# Patient Record
Sex: Female | Born: 1935 | Race: White | Hispanic: No | Marital: Married | Smoking: Never smoker
Health system: Southern US, Community
[De-identification: ages and names within clinical notes are randomized; demographics above are authoritative.]

## PROBLEM LIST (undated history)

## (undated) DIAGNOSIS — K5790 Diverticulosis of intestine, part unspecified, without perforation or abscess without bleeding: Secondary | ICD-10-CM

## (undated) DIAGNOSIS — N6019 Diffuse cystic mastopathy of unspecified breast: Secondary | ICD-10-CM

## (undated) DIAGNOSIS — Z860100 Personal history of colon polyps, unspecified: Secondary | ICD-10-CM

## (undated) DIAGNOSIS — K56609 Unspecified intestinal obstruction, unspecified as to partial versus complete obstruction: Secondary | ICD-10-CM

## (undated) DIAGNOSIS — E049 Nontoxic goiter, unspecified: Secondary | ICD-10-CM

## (undated) DIAGNOSIS — J45909 Unspecified asthma, uncomplicated: Secondary | ICD-10-CM

## (undated) DIAGNOSIS — I495 Sick sinus syndrome: Secondary | ICD-10-CM

## (undated) DIAGNOSIS — I10 Essential (primary) hypertension: Secondary | ICD-10-CM

## (undated) DIAGNOSIS — I480 Paroxysmal atrial fibrillation: Secondary | ICD-10-CM

## (undated) DIAGNOSIS — Z780 Asymptomatic menopausal state: Secondary | ICD-10-CM

## (undated) DIAGNOSIS — E039 Hypothyroidism, unspecified: Secondary | ICD-10-CM

## (undated) DIAGNOSIS — C44311 Basal cell carcinoma of skin of nose: Secondary | ICD-10-CM

## (undated) DIAGNOSIS — Z8619 Personal history of other infectious and parasitic diseases: Secondary | ICD-10-CM

## (undated) DIAGNOSIS — I499 Cardiac arrhythmia, unspecified: Secondary | ICD-10-CM

## (undated) HISTORY — DX: Hypothyroidism, unspecified: E03.9

## (undated) HISTORY — DX: Sick sinus syndrome: I49.5

## (undated) HISTORY — DX: Paroxysmal atrial fibrillation: I48.0

## (undated) HISTORY — DX: Unspecified intestinal obstruction, unspecified as to partial versus complete obstruction: K56.609

## (undated) HISTORY — PX: INSERT / REPLACE / REMOVE PACEMAKER: SUR710

## (undated) HISTORY — DX: Diffuse cystic mastopathy of unspecified breast: N60.19

## (undated) HISTORY — DX: Nontoxic goiter, unspecified: E04.9

## (undated) HISTORY — DX: Unspecified asthma, uncomplicated: J45.909

## (undated) HISTORY — DX: Asymptomatic menopausal state: Z78.0

## (undated) HISTORY — PX: COLONOSCOPY: SHX174

---

## 2004-10-27 ENCOUNTER — Ambulatory Visit: Payer: Self-pay | Admitting: Internal Medicine

## 2004-12-26 ENCOUNTER — Ambulatory Visit: Payer: Self-pay

## 2006-10-22 ENCOUNTER — Ambulatory Visit: Payer: Self-pay | Admitting: Internal Medicine

## 2008-03-04 ENCOUNTER — Ambulatory Visit: Payer: Self-pay | Admitting: Internal Medicine

## 2009-08-03 ENCOUNTER — Ambulatory Visit: Payer: Self-pay | Admitting: Internal Medicine

## 2009-08-03 DIAGNOSIS — K56609 Unspecified intestinal obstruction, unspecified as to partial versus complete obstruction: Secondary | ICD-10-CM

## 2009-08-03 HISTORY — DX: Unspecified intestinal obstruction, unspecified as to partial versus complete obstruction: K56.609

## 2009-08-27 ENCOUNTER — Inpatient Hospital Stay: Payer: Self-pay | Admitting: Internal Medicine

## 2010-08-16 ENCOUNTER — Ambulatory Visit: Payer: Self-pay | Admitting: Internal Medicine

## 2011-10-31 ENCOUNTER — Ambulatory Visit: Payer: Self-pay | Admitting: Internal Medicine

## 2012-02-05 ENCOUNTER — Encounter: Payer: Self-pay | Admitting: *Deleted

## 2012-02-06 ENCOUNTER — Ambulatory Visit (INDEPENDENT_AMBULATORY_CARE_PROVIDER_SITE_OTHER): Payer: Medicare Other | Admitting: Cardiovascular Disease

## 2012-02-06 ENCOUNTER — Encounter: Payer: Self-pay | Admitting: Cardiovascular Disease

## 2012-02-06 VITALS — BP 152/62 | HR 118 | Ht 64.0 in | Wt 176.0 lb

## 2012-02-06 DIAGNOSIS — E785 Hyperlipidemia, unspecified: Secondary | ICD-10-CM

## 2012-02-06 DIAGNOSIS — I10 Essential (primary) hypertension: Secondary | ICD-10-CM

## 2012-02-06 DIAGNOSIS — E782 Mixed hyperlipidemia: Secondary | ICD-10-CM | POA: Insufficient documentation

## 2012-02-06 DIAGNOSIS — I4891 Unspecified atrial fibrillation: Secondary | ICD-10-CM

## 2012-02-06 MED ORDER — DILTIAZEM HCL ER COATED BEADS 120 MG PO CP24: 120.0000 mg | ORAL_CAPSULE | Freq: Every day | ORAL | Status: DC

## 2012-02-06 NOTE — Progress Notes (Signed)
Patient ID: Hailey Simmons, female    DOB: 1936/11/22, 76 y.o.   MRN: [id number]  HPI Comments: Hailey Simmons is a very pleasant 76 year old woman, patient of Dr. Bethann Punches, with history of hypothyroidism, hyperlipidemia , who presents by self referral for evaluation of atrial fibrillation.   She reports that she had a routine visit in January 2013 with Dr. Hyacinth Meeker and was noted at that time to have atrial fibrillation. Echocardiogram was ordered that showed mild LVH, mild MR otherwise normal study. Right ventricular systolic pressure 44 mm mercury.   She has been tracking her blood pressure and heart rate over the past several months and has noted numerous episodes with irregular heart beats, sometimes with rates greater than 100. Maximum rate has been in the 120 range. When it is elevated, she sits in a recliner all day afraid to do anything though she is relatively asymptomatic with no significant palpitations or shortness of breath. She has been taking aspirin 325 mg daily. She is otherwise active with no complaints.  She does have a strong family history of coronary artery disease, father died of heart attack at age 40, mother at age 30 both from cardiac disease.  Recent lab work shows total cholesterol 207, LDL 131, HDL 52, TSH 4.3  EKG today shows atrial fibrillation with ventricular rate 118 beats per minute. Right bundle branch block, nonspecific ST abnormality   Outpatient Encounter Prescriptions as of 02/06/2012  Medication Sig Dispense Refill  . aspirin EC 81 MG tablet Take 81 mg by mouth every other day.      . calcium carbonate (OS-CAL) 600 MG TABS Take 600 mg by mouth 2 (two) times daily with a meal.      . Cholecalciferol (VITAMIN D3) 2000 UNITS TABS Take 1 tablet by mouth daily.      Marland Kitchen levothyroxine (SYNTHROID, LEVOTHROID) 100 MCG tablet Take 100 mcg by mouth daily.        Review of Systems  Constitutional: Negative.   HENT: Negative.   Eyes: Negative.   Respiratory: Negative.    Cardiovascular: Positive for palpitations.  Gastrointestinal: Negative.   Musculoskeletal: Negative.   Skin: Negative.   Neurological: Negative.   Hematological: Negative.   Psychiatric/Behavioral: Negative.   All other systems reviewed and are negative.    BP 152/62  Pulse 118  Ht 5\' 4"  (1.626 m)  Wt 176 lb (79.833 kg)  BMI 30.21 kg/m2  Physical Exam  Nursing note and vitals reviewed. Constitutional: She is oriented to person, place, and time. She appears well-developed and well-nourished.  HENT:  Head: Normocephalic.  Nose: Nose normal.  Mouth/Throat: Oropharynx is clear and moist.  Eyes: Conjunctivae are normal. Pupils are equal, round, and reactive to light.  Neck: Normal range of motion. Neck supple. No JVD present.  Cardiovascular: S1 normal, S2 normal, normal heart sounds and intact distal pulses.  An irregularly irregular rhythm present. Tachycardia present.  Exam reveals no gallop and no friction rub.   No murmur heard. Pulmonary/Chest: Effort normal and breath sounds normal. No respiratory distress. She has no wheezes. She has no rales. She exhibits no tenderness.  Abdominal: Soft. Bowel sounds are normal. She exhibits no distension. There is no tenderness.  Musculoskeletal: Normal range of motion. She exhibits no edema and no tenderness.  Lymphadenopathy:    She has no cervical adenopathy.  Neurological: She is alert and oriented to person, place, and time. Coordination normal.  Skin: Skin is warm and dry. No rash noted. No erythema.  Psychiatric: She has a normal mood and affect. Her behavior is normal. Judgment and thought content normal.         Assessment and Plan

## 2012-02-06 NOTE — Patient Instructions (Signed)
Please stop the aspirin Start xarelto one a day (blood thinner) Start diltiazem 120 mg once a day for blood pressure and heart rate  Monitor your heart rate and blood pressure as you have been doing. Call the office if your heart rate continues to be irregular  Please call us if you have new issues that need to be addressed before your next appt.  Your physician wants you to follow-up in: 3 weeks

## 2012-02-06 NOTE — Assessment & Plan Note (Signed)
Blood pressure has been elevated over the course of the past several months with systolic pressure typically in the 150, sometimes 160 range. This could be contributed to her atrial fibrillation episodes. We will start diltiazem and have her monitor her blood pressure with titration of medications as needed

## 2012-02-06 NOTE — Assessment & Plan Note (Signed)
Cholesterol is high and we have suggested we discussed this with her on her next clinic visit. She does have a strong family history of coronary artery disease, father died of heart attack at age 73, mother at age 69 both from cardiac disease.

## 2012-02-06 NOTE — Assessment & Plan Note (Signed)
Her notes indicate paroxysmal atrial fibrillation though unable to exclude chronic/persistent A. Fib. Notes from her echocardiogram suggests she was normal sinus rhythm at that time in February 2013.  We have suggested she start anticoagulation with xarelto 20 milligrams daily will try to control her rhythm. We will start diltiazem 120 mg daily. I've asked her to monitor her heart rate and blood pressure. Additional options for rhythm control include adding low-dose beta blocker, possibly digoxin. Even other antiarrhythmics such as flecainide could be used.

## 2012-02-27 ENCOUNTER — Encounter: Payer: Self-pay | Admitting: Cardiovascular Disease

## 2012-02-27 ENCOUNTER — Ambulatory Visit (INDEPENDENT_AMBULATORY_CARE_PROVIDER_SITE_OTHER): Payer: Medicare Other | Admitting: Cardiovascular Disease

## 2012-02-27 VITALS — BP 138/64 | HR 73 | Ht 64.0 in | Wt 176.0 lb

## 2012-02-27 DIAGNOSIS — I4891 Unspecified atrial fibrillation: Secondary | ICD-10-CM

## 2012-02-27 DIAGNOSIS — I10 Essential (primary) hypertension: Secondary | ICD-10-CM

## 2012-02-27 DIAGNOSIS — E785 Hyperlipidemia, unspecified: Secondary | ICD-10-CM

## 2012-02-27 MED ORDER — RIVAROXABAN 20 MG PO TABS: 20.0000 mg | ORAL_TABLET | Freq: Every day | ORAL | Status: DC

## 2012-02-27 MED ORDER — DILTIAZEM HCL ER COATED BEADS 180 MG PO CP24: 120.0000 mg | ORAL_CAPSULE | Freq: Every day | ORAL | Status: DC

## 2012-02-27 MED ORDER — FLECAINIDE ACETATE 50 MG PO TABS: 50.0000 mg | ORAL_TABLET | Freq: Two times a day (BID) | ORAL | Status: DC

## 2012-02-27 NOTE — Patient Instructions (Addendum)
  Please increase the diltiazem to 180 mg daily Wait a few a days If you have more atrial fib, then start  flecainide 50 mg twice a day Continue on diltiazem once a day  If you have breakthrough atrial fibrillation, take an extra flecainide 50 mg pill  Please call us if you have new issues that need to be addressed before your next appt.  Your physician wants you to follow-up in: 1 months.  You will receive a reminder letter in the mail two months in advance. If you don't receive a letter, please call our office to schedule the follow-up appointment.

## 2012-02-27 NOTE — Assessment & Plan Note (Signed)
We'll discuss her high cholesterol with her on the next visit given we are starting other medications for atrial fibrillation.

## 2012-02-27 NOTE — Progress Notes (Signed)
Patient ID: Hailey Simmons, female    DOB: [birth year], 76 y.o.   MRN: [id number]  HPI Comments: Hailey Simmons is a very pleasant 76 year old woman, patient of Dr. Bethann Punches, with history of hypothyroidism, hyperlipidemia , recently seen for management of atrial fibrillation 3 weeks ago.  January 2013 with Dr. Hyacinth Meeker  was noted at that time to have atrial fibrillation. Echocardiogram was ordered that showed mild LVH, mild MR otherwise normal study. Right ventricular systolic pressure 44 mm mercury. At that time she was in atrial fibrillation. Diltiazem 120 mg daily was started.  She reports having several episodes of atrial fibrillation since her last office visit. Approximately 9 or 10 episodes ranging from 1 hour to 8 hours. The diltiazem has seemed to help to some degree. Blood pressure measurements continue to be periodically elevated. She otherwise feels well and has no complaints.  She does have a strong family history of coronary artery disease, father died of heart attack at age 2, mother at age 17 both from cardiac disease.  Recent lab work shows total cholesterol 207, LDL 131, HDL 52, TSH 4.3  EKG today shows normal sinus rhythm with right bundle branch block   Outpatient Encounter Prescriptions as of 02/27/2012  Medication Sig Dispense Refill  . calcium carbonate (OS-CAL) 600 MG TABS Take 600 mg by mouth 2 (two) times daily with a meal.      . Cholecalciferol (VITAMIN D3) 2000 UNITS TABS Take 1 tablet by mouth daily.      Marland Kitchen diltiazem (CARDIZEM CD) 120 MG 24 hr capsule Take 1 capsule (120 mg total) by mouth daily.  30 capsule  11  . levothyroxine (SYNTHROID, LEVOTHROID) 100 MCG tablet Take 100 mcg by mouth daily.      . Rivaroxaban (XARELTO) 20 MG TABS Take 20 mg by mouth daily.  30 tablet  6    Review of Systems  Constitutional: Negative.   HENT: Negative.   Eyes: Negative.   Respiratory: Negative.   Cardiovascular: Positive for palpitations.  Gastrointestinal: Negative.     Musculoskeletal: Negative.   Skin: Negative.   Neurological: Negative.   Hematological: Negative.   Psychiatric/Behavioral: Negative.   All other systems reviewed and are negative.    BP 138/64  Pulse 73  Ht 5\' 4"  (1.626 m)  Wt 176 lb (79.833 kg)  BMI 30.21 kg/m2  Physical Exam  Nursing note and vitals reviewed. Constitutional: She is oriented to person, place, and time. She appears well-developed and well-nourished.  HENT:  Head: Normocephalic.  Nose: Nose normal.  Mouth/Throat: Oropharynx is clear and moist.  Eyes: Conjunctivae are normal. Pupils are equal, round, and reactive to light.  Neck: Normal range of motion. Neck supple. No JVD present.  Cardiovascular: Normal rate, regular rhythm, S1 normal, S2 normal, normal heart sounds and intact distal pulses.  Exam reveals no gallop and no friction rub.   No murmur heard. Pulmonary/Chest: Effort normal and breath sounds normal. No respiratory distress. She has no wheezes. She has no rales. She exhibits no tenderness.  Abdominal: Soft. Bowel sounds are normal. She exhibits no distension. There is no tenderness.  Musculoskeletal: Normal range of motion. She exhibits no edema and no tenderness.  Lymphadenopathy:    She has no cervical adenopathy.  Neurological: She is alert and oriented to person, place, and time. Coordination normal.  Skin: Skin is warm and dry. No rash noted. No erythema.  Psychiatric: She has a normal mood and affect. Her behavior is normal. Judgment and thought content  normal.         Assessment and Plan

## 2012-02-27 NOTE — Assessment & Plan Note (Signed)
Blood pressure continues to be elevated periodically. Will increase the diltiazem to 180 mg daily.

## 2012-02-27 NOTE — Assessment & Plan Note (Signed)
We'll increase the diltiazem to 180 mg daily for rhythm control and blood pressure. If she continues to have atrial fibrillation breakthrough episodes, we will start flecainide 50 mg twice a day. I suggested if she has breakthrough atrial fibrillation, that she take an extra flecainide 50 mg x1. She will continue anticoagulation for now.

## 2012-04-03 ENCOUNTER — Ambulatory Visit (INDEPENDENT_AMBULATORY_CARE_PROVIDER_SITE_OTHER): Payer: Medicare Other | Admitting: Cardiovascular Disease

## 2012-04-03 ENCOUNTER — Encounter: Payer: Self-pay | Admitting: Cardiovascular Disease

## 2012-04-03 VITALS — BP 132/68 | HR 62 | Ht 65.0 in | Wt 177.5 lb

## 2012-04-03 DIAGNOSIS — E785 Hyperlipidemia, unspecified: Secondary | ICD-10-CM

## 2012-04-03 DIAGNOSIS — I4891 Unspecified atrial fibrillation: Secondary | ICD-10-CM

## 2012-04-03 DIAGNOSIS — I10 Essential (primary) hypertension: Secondary | ICD-10-CM

## 2012-04-03 MED ORDER — ATORVASTATIN CALCIUM 10 MG PO TABS: 10.0000 mg | ORAL_TABLET | Freq: Every day | ORAL | Status: DC

## 2012-04-03 MED ORDER — HYDROCHLOROTHIAZIDE 25 MG PO TABS: 25.0000 mg | ORAL_TABLET | Freq: Every day | ORAL | Status: DC

## 2012-04-03 NOTE — Assessment & Plan Note (Signed)
We had a long discussion about her cholesterol and family history. She is willing to try Lipitor 10 mg daily.

## 2012-04-03 NOTE — Assessment & Plan Note (Signed)
By her account, arrhythmia has been well controlled. We'll continue her current medications. She may have mild edema from calcium channel blocker. If this gets worse, we have suggest she call us.

## 2012-04-03 NOTE — Assessment & Plan Note (Signed)
Blood pressure is borderline elevated. We have given her HCTZ 25 mg to take when necessary. We have suggested she could try one half tab in the morning to start.

## 2012-04-03 NOTE — Patient Instructions (Addendum)
You are doing well. Please start lipitor 10 mg daily  Please consider talking HCTZ for swelling in ankles or fingers (1/2 pill to start), or for high blood pressure  Please call us if you have new issues that need to be addressed before your next appt.  Your physician wants you to follow-up in: 6 months.  You will receive a reminder letter in the mail two months in advance. If you don't receive a letter, please call our office to schedule the follow-up appointment.

## 2012-04-03 NOTE — Progress Notes (Signed)
Patient ID: Hailey Simmons, female    DOB: 04/06/1936, 76 y.o.   MRN: [id number]  HPI Comments: Hailey Simmons is a very pleasant 76 year old woman, patient of Dr. Bethann Punches, with history of hypothyroidism, hyperlipidemia , recently seen for management of atrial fibrillation. In January 2013 with Dr. Hyacinth Meeker  was noted at that time to have atrial fibrillation. Echocardiogram showed mild LVH, mild MR otherwise normal study. Right ventricular systolic pressure 44 mm mercury. At that time she was in atrial fibrillation. Diltiazem 120 mg daily was started.  On her last clinic visit, her diltiazem was increased to 180 mg daily and she was started on flecainide 50 mg twice a day. On this regimen, she has felt well with minimal breakthrough arrhythmia. She has rare light palpitations but otherwise is very happy last episode of arrhythmia was approximately one month ago.   She does have a strong family history of coronary artery disease, father 50 mg twice a day of heart attack at age 65, mother at age 54 both from cardiac disease.   lab work shows total cholesterol 207, LDL 131, HDL 52, TSH 4.3   Outpatient Encounter Prescriptions as of 04/03/2012  Medication Sig Dispense Refill  . calcium carbonate (OS-CAL) 600 MG TABS Take 600 mg by mouth 2 (two) times daily with a meal.      . Cholecalciferol (VITAMIN D3) 2000 UNITS TABS Take 1 tablet by mouth daily.      Marland Kitchen diltiazem (CARDIZEM CD) 180 MG 24 hr capsule Take 1 capsule (180 mg total) by mouth daily.  30 capsule  11  . flecainide (TAMBOCOR) 50 MG tablet Take 1 tablet (50 mg total) by mouth 2 (two) times daily.  60 tablet  6  . levothyroxine (SYNTHROID, LEVOTHROID) 100 MCG tablet Take 100 mcg by mouth daily.      . Rivaroxaban (XARELTO) 20 MG TABS Take 20 mg by mouth daily.  30 tablet  6   Review of Systems  Constitutional: Negative.   HENT: Negative.   Eyes: Negative.   Respiratory: Negative.   Cardiovascular: Positive for palpitations.    Gastrointestinal: Negative.   Musculoskeletal: Negative.   Skin: Negative.   Neurological: Negative.   Hematological: Negative.   Psychiatric/Behavioral: Negative.   All other systems reviewed and are negative.    BP 132/68  Pulse 62  Ht 5\' 5"  (1.651 m)  Wt 177 lb 8 oz (80.513 kg)  BMI 29.54 kg/m2  Physical Exam  Nursing note and vitals reviewed. Constitutional: She is oriented to person, place, and time. She appears well-developed and well-nourished.  HENT:  Head: Normocephalic.  Nose: Nose normal.  Mouth/Throat: Oropharynx is clear and moist.  Eyes: Conjunctivae are normal. Pupils are equal, round, and reactive to light.  Neck: Normal range of motion. Neck supple. No JVD present.  Cardiovascular: Normal rate, regular rhythm, S1 normal, S2 normal, normal heart sounds and intact distal pulses.  Exam reveals no gallop and no friction rub.   No murmur heard. Pulmonary/Chest: Effort normal and breath sounds normal. No respiratory distress. She has no wheezes. She has no rales. She exhibits no tenderness.  Abdominal: Soft. Bowel sounds are normal. She exhibits no distension. There is no tenderness.  Musculoskeletal: Normal range of motion. She exhibits no edema and no tenderness.  Lymphadenopathy:    She has no cervical adenopathy.  Neurological: She is alert and oriented to person, place, and time. Coordination normal.  Skin: Skin is warm and dry. No rash noted. No erythema.  Psychiatric: She has a normal mood and affect. Her behavior is normal. Judgment and thought content normal.         Assessment and Plan

## 2012-07-03 ENCOUNTER — Ambulatory Visit (INDEPENDENT_AMBULATORY_CARE_PROVIDER_SITE_OTHER): Payer: Medicare Other

## 2012-07-03 DIAGNOSIS — R0989 Other specified symptoms and signs involving the circulatory and respiratory systems: Secondary | ICD-10-CM

## 2012-07-03 DIAGNOSIS — Z79899 Other long term (current) drug therapy: Secondary | ICD-10-CM

## 2012-07-03 DIAGNOSIS — E785 Hyperlipidemia, unspecified: Secondary | ICD-10-CM

## 2012-10-01 ENCOUNTER — Other Ambulatory Visit: Payer: Self-pay | Admitting: *Deleted

## 2012-10-01 MED ORDER — FLECAINIDE ACETATE 50 MG PO TABS: 50.0000 mg | ORAL_TABLET | Freq: Two times a day (BID) | ORAL | Status: DC

## 2012-10-01 NOTE — Telephone Encounter (Signed)
Refilled Flecainide. 

## 2012-10-06 ENCOUNTER — Encounter: Payer: Self-pay | Admitting: Cardiovascular Disease

## 2012-10-06 ENCOUNTER — Ambulatory Visit (INDEPENDENT_AMBULATORY_CARE_PROVIDER_SITE_OTHER): Payer: Medicare Other | Admitting: Cardiovascular Disease

## 2012-10-06 VITALS — BP 135/70 | HR 61 | Resp 16 | Ht 65.0 in | Wt 180.5 lb

## 2012-10-06 DIAGNOSIS — I10 Essential (primary) hypertension: Secondary | ICD-10-CM

## 2012-10-06 DIAGNOSIS — I4891 Unspecified atrial fibrillation: Secondary | ICD-10-CM

## 2012-10-06 DIAGNOSIS — E785 Hyperlipidemia, unspecified: Secondary | ICD-10-CM

## 2012-10-06 NOTE — Patient Instructions (Addendum)
You are doing well. No medication changes were made.  Please call us if you have new issues that need to be addressed before your next appt.  Your physician wants you to follow-up in: 12 months.  You will receive a reminder letter in the mail two months in advance. If you don't receive a letter, please call our office to schedule the follow-up appointment. 

## 2012-10-06 NOTE — Assessment & Plan Note (Addendum)
Cholesterol significantly improved on Lipitor .Total cholesterol 143. LDL in the 60s

## 2012-10-06 NOTE — Progress Notes (Signed)
Patient ID: Hailey Simmons, female    DOB: 07/09/1936, 76 y.o.   MRN: [id number]  HPI Comments: [name] is a very pleasant 76 year old woman, patient of Dr. Bethann Punches, with history of hypothyroidism, hyperlipidemia , paroxysmal atrial fibrillation. In January 2013 with Dr. Hyacinth Meeker  was noted at that time to have atrial fibrillation. Echocardiogram showed mild LVH, mild MR otherwise normal study. Right ventricular systolic pressure 44 mm mercury. At that time she was in atrial fibrillation. Diltiazem 120 mg daily was started.   diltiazem was increased to 180 mg daily and she was started on flecainide 50 mg twice a day for continued symptoms.  On this regimen, she has felt well with minimal breakthrough arrhythmia. She has rare light palpitations but otherwise is very happy. She does not remember her last episode of atrial.fibrillation. Blood pressure at home has been in the 130 systolic range  She does have a strong family history of coronary artery disease, father 50 mg twice a day of heart attack at age 85, mother at age 36 both from cardiac disease.  On Lipitor 10 mg daily, total cholesterol now 143 down from 207 EKG shows normal sinus rhythm with right bundle branch block, rate 61 beats per minute   Outpatient Encounter Prescriptions as of 10/06/2012  Medication Sig Dispense Refill  . aspirin 81 MG tablet Take 81 mg by mouth daily.      Marland Kitchen atorvastatin (LIPITOR) 10 MG tablet Take 1 tablet (10 mg total) by mouth daily.  30 tablet  11  . calcium carbonate (OS-CAL) 600 MG TABS Take 600 mg by mouth 2 (two) times daily with a meal.      . Cholecalciferol (VITAMIN D3) 2000 UNITS TABS Take 1 tablet by mouth daily.      Marland Kitchen diltiazem (CARDIZEM CD) 180 MG 24 hr capsule Take 1 capsule (180 mg total) by mouth daily.  30 capsule  11  . flecainide (TAMBOCOR) 50 MG tablet Take 1 tablet (50 mg total) by mouth 2 (two) times daily.  60 tablet  6  . hydrochlorothiazide (HYDRODIURIL) 25 MG tablet Take 1 tablet  (25 mg total) by mouth daily.  30 tablet  6  . levothyroxine (SYNTHROID, LEVOTHROID) 100 MCG tablet Take 100 mcg by mouth daily.      . [DISCONTINUED] Rivaroxaban (XARELTO) 20 MG TABS Take 20 mg by mouth daily.  30 tablet  6    Review of Systems  Constitutional: Negative.   HENT: Negative.   Eyes: Negative.   Respiratory: Negative.   Gastrointestinal: Negative.   Musculoskeletal: Negative.   Skin: Negative.   Neurological: Negative.   Hematological: Negative.   Psychiatric/Behavioral: Negative.   All other systems reviewed and are negative.    BP 135/70  Pulse 61  Resp 16  Ht 5\' 5"  (1.651 m)  Wt 180 lb 8 oz (81.874 kg)  BMI 30.04 kg/m2  Physical Exam  Nursing note and vitals reviewed. Constitutional: She is oriented to person, place, and time. She appears well-developed and well-nourished.  HENT:  Head: Normocephalic.  Nose: Nose normal.  Mouth/Throat: Oropharynx is clear and moist.  Eyes: Conjunctivae normal are normal. Pupils are equal, round, and reactive to light.  Neck: Normal range of motion. Neck supple. No JVD present.  Cardiovascular: Normal rate, regular rhythm, S1 normal, S2 normal, normal heart sounds and intact distal pulses.  Exam reveals no gallop and no friction rub.   No murmur heard. Pulmonary/Chest: Effort normal and breath sounds normal. No respiratory distress.  She has no wheezes. She has no rales. She exhibits no tenderness.  Abdominal: Soft. Bowel sounds are normal. She exhibits no distension. There is no tenderness.  Musculoskeletal: Normal range of motion. She exhibits no edema and no tenderness.  Lymphadenopathy:    She has no cervical adenopathy.  Neurological: She is alert and oriented to person, place, and time. Coordination normal.  Skin: Skin is warm and dry. No rash noted. No erythema.  Psychiatric: She has a normal mood and affect. Her behavior is normal. Judgment and thought content normal.         Assessment and Plan

## 2012-10-06 NOTE — Assessment & Plan Note (Signed)
We'll continue current medications. No breakthrough arrhythmia

## 2012-10-06 NOTE — Assessment & Plan Note (Signed)
Blood pressure adequate today. We have asked her to maintain her weight, monitor her blood pressure at home.

## 2012-11-03 ENCOUNTER — Other Ambulatory Visit: Payer: Self-pay

## 2012-11-03 MED ORDER — HYDROCHLOROTHIAZIDE 25 MG PO TABS: 25.0000 mg | ORAL_TABLET | Freq: Every day | ORAL | Status: DC

## 2012-11-03 NOTE — Telephone Encounter (Signed)
Refill sent for hydrochlorothiazide  

## 2012-11-24 ENCOUNTER — Ambulatory Visit: Payer: Self-pay | Admitting: Internal Medicine

## 2012-11-28 ENCOUNTER — Encounter: Payer: Self-pay | Admitting: Cardiovascular Disease

## 2012-11-28 ENCOUNTER — Ambulatory Visit (INDEPENDENT_AMBULATORY_CARE_PROVIDER_SITE_OTHER): Payer: Medicare Other | Admitting: Cardiovascular Disease

## 2012-11-28 VITALS — BP 158/70 | HR 68 | Ht 65.0 in | Wt 179.4 lb

## 2012-11-28 DIAGNOSIS — I4891 Unspecified atrial fibrillation: Secondary | ICD-10-CM

## 2012-11-28 DIAGNOSIS — E785 Hyperlipidemia, unspecified: Secondary | ICD-10-CM

## 2012-11-28 DIAGNOSIS — I10 Essential (primary) hypertension: Secondary | ICD-10-CM

## 2012-11-28 MED ORDER — LISINOPRIL-HYDROCHLOROTHIAZIDE 20-25 MG PO TABS: 1.0000 | ORAL_TABLET | Freq: Every day | ORAL | Status: DC

## 2012-11-28 NOTE — Assessment & Plan Note (Signed)
We will change HCTZ to lisinopril HCTZ for better control.

## 2012-11-28 NOTE — Progress Notes (Signed)
Patient ID: Hailey Simmons, female    DOB: [birth year], 76 y.o.   MRN: [id number]  HPI Comments: Hailey Simmons is a very pleasant 76 year old woman, patient of Dr. Bethann Punches, with history of hypothyroidism, hyperlipidemia , paroxysmal atrial fibrillation. In January 2013 with Dr. Hyacinth Meeker  was noted at that time to have atrial fibrillation. Echocardiogram showed normal ejection fraction, otherwise normal study.  At that time she was in atrial fibrillation. Diltiazem 120 mg daily was started.   diltiazem was increased to 180 mg daily and she was started on flecainide 50 mg twice a day for continued symptoms.  On this regimen, she has felt well with minimal breakthrough arrhythmia.  2 recent very short episodes of atrial fibrillation lasting less than one hour on November 07 2012 and December 25 otherwise she has been doing well   Blood pressure at home has been in the 130 to 140 range systolic range  She does have a strong family history of coronary artery disease, father 50 mg twice a day of heart attack at age 27, mother at age 25 both from cardiac disease.  On Lipitor 10 mg daily, total cholesterol now 143 down from 207 EKG shows normal sinus rhythm with right bundle branch block, rate 69 beats per minute   Outpatient Encounter Prescriptions as of 11/28/2012  Medication Sig Dispense Refill  . aspirin 81 MG tablet Take 81 mg by mouth daily.      Marland Kitchen atorvastatin (LIPITOR) 10 MG tablet Take 1 tablet (10 mg total) by mouth daily.  30 tablet  11  . calcium carbonate (OS-CAL) 600 MG TABS Take 600 mg by mouth 2 (two) times daily with a meal.      . Cholecalciferol (VITAMIN D3) 2000 UNITS TABS Take 1 tablet by mouth daily.      Marland Kitchen diltiazem (CARDIZEM CD) 180 MG 24 hr capsule Take 1 capsule (180 mg total) by mouth daily.  30 capsule  11  . flecainide (TAMBOCOR) 50 MG tablet Take 1 tablet (50 mg total) by mouth 2 (two) times daily.  60 tablet  6  . levothyroxine (SYNTHROID, LEVOTHROID) 100 MCG tablet Take  100 mcg by mouth daily.      . rivaroxaban (XARELTO) 10 MG TABS tablet Take 20 mg by mouth daily. Pt resumed taking on her own      . [DISCONTINUED] hydrochlorothiazide (HYDRODIURIL) 25 MG tablet Take 1 tablet (25 mg total) by mouth daily.  30 tablet  6  . lisinopril-hydrochlorothiazide (PRINZIDE,ZESTORETIC) 20-25 MG per tablet Take 1 tablet by mouth daily.  30 tablet  11    Review of Systems  Constitutional: Negative.   HENT: Negative.   Eyes: Negative.   Respiratory: Negative.   Cardiovascular: Negative.   Gastrointestinal: Negative.   Musculoskeletal: Negative.   Skin: Negative.   Neurological: Negative.   Hematological: Negative.   Psychiatric/Behavioral: Negative.   All other systems reviewed and are negative.    BP 158/70  Pulse 68  Ht 5\' 5"  (1.651 m)  Wt 179 lb 6.4 oz (81.375 kg)  BMI 29.85 kg/m2  Physical Exam  Nursing note and vitals reviewed. Constitutional: She is oriented to person, place, and time. She appears well-developed and well-nourished.  HENT:  Head: Normocephalic.  Nose: Nose normal.  Mouth/Throat: Oropharynx is clear and moist.  Eyes: Conjunctivae normal are normal. Pupils are equal, round, and reactive to light.  Neck: Normal range of motion. Neck supple. No JVD present.  Cardiovascular: Normal rate, regular rhythm, S1 normal, S2  normal, normal heart sounds and intact distal pulses.  Exam reveals no gallop and no friction rub.   No murmur heard. Pulmonary/Chest: Effort normal and breath sounds normal. No respiratory distress. She has no wheezes. She has no rales. She exhibits no tenderness.  Abdominal: Soft. Bowel sounds are normal. She exhibits no distension. There is no tenderness.  Musculoskeletal: Normal range of motion. She exhibits no edema and no tenderness.  Lymphadenopathy:    She has no cervical adenopathy.  Neurological: She is alert and oriented to person, place, and time. Coordination normal.  Skin: Skin is warm and dry. No rash noted.  No erythema.  Psychiatric: She has a normal mood and affect. Her behavior is normal. Judgment and thought content normal.         Assessment and Plan

## 2012-11-28 NOTE — Assessment & Plan Note (Signed)
Rare episodes. In general well controlled on her current medication regimen.

## 2012-11-28 NOTE — Patient Instructions (Addendum)
You are doing well. When you run out of HCTZ, Start lisinopril HCTZ one a day for blood pressure  Call for any side effects Continue to take extra flecainide for atrial fibrillation, ok to take xarelto If you have frequent atrial fib, call the office   Please call us if you have new issues that need to be addressed before your next appt.  Your physician wants you to follow-up in: 6 months.  You will receive a reminder letter in the mail two months in advance. If you don't receive a letter, please call our office to schedule the follow-up appointment.

## 2012-11-28 NOTE — Assessment & Plan Note (Signed)
Cholesterol is at goal on the current lipid regimen. No changes to the medications were made.  

## 2012-12-02 ENCOUNTER — Other Ambulatory Visit: Payer: Self-pay | Admitting: Cardiovascular Disease

## 2013-02-18 ENCOUNTER — Other Ambulatory Visit: Payer: Self-pay

## 2013-02-18 MED ORDER — DILTIAZEM HCL ER COATED BEADS 180 MG PO CP24: 120.0000 mg | ORAL_CAPSULE | Freq: Every day | ORAL | Status: DC

## 2013-02-18 NOTE — Telephone Encounter (Signed)
Refill sent for diltiazem 180 mg take one tablet daily.

## 2013-03-19 ENCOUNTER — Telehealth: Payer: Self-pay

## 2013-03-19 ENCOUNTER — Other Ambulatory Visit: Payer: Self-pay

## 2013-03-19 MED ORDER — HYDROCHLOROTHIAZIDE 25 MG PO TABS: 25.0000 mg | ORAL_TABLET | Freq: Every day | ORAL | Status: DC

## 2013-03-19 MED ORDER — LOSARTAN POTASSIUM 50 MG PO TABS: 50.0000 mg | ORAL_TABLET | Freq: Every day | ORAL | Status: DC

## 2013-03-19 NOTE — Telephone Encounter (Signed)
Pt informed RX sent to pharm She denies LE edema with red spots Will f/u with PCP about this

## 2013-03-19 NOTE — Telephone Encounter (Signed)
Pt wanted to let us know she has had a dry hacking cough since starting lisinopril/HCT BPs have been very good (118-130 systolic) I explained this cough could be coming from ACE-I and I will talk with Dr. Mariah Milling about switching to a diff. Medication.  She then tells me she has noticed "tiny red spots" on both LE, says she showed these to pharmacist who advised she mention to [place].  She is taking Xarelto. I will talk with Dr. Mariah Milling about this as well and call her back.  Denies bleeding elsewhere.

## 2013-03-19 NOTE — Telephone Encounter (Signed)
Pt states that her BP medication is causing a "continuous cough", and would like to change medications. Please advise

## 2013-03-19 NOTE — Telephone Encounter (Signed)
I discussed with Dr. Mariah Milling who says to "have pt stop lisinopril HCT and try losartan HCT 50/25 mg PO QD. If patient has LE edema associated with red spots on legs, she can try compression hose, otherwise should follow up with PCP" VO Dr. Alvis Lemmings, RN

## 2013-03-30 ENCOUNTER — Other Ambulatory Visit: Payer: Self-pay | Admitting: *Deleted

## 2013-03-30 MED ORDER — ATORVASTATIN CALCIUM 10 MG PO TABS: 10.0000 mg | ORAL_TABLET | Freq: Every day | ORAL | Status: DC

## 2013-03-30 NOTE — Telephone Encounter (Signed)
Refilled Atorvastatin sent to [facility] pharmacy.

## 2013-05-12 ENCOUNTER — Other Ambulatory Visit: Payer: Self-pay | Admitting: *Deleted

## 2013-05-12 MED ORDER — FLECAINIDE ACETATE 50 MG PO TABS: 50.0000 mg | ORAL_TABLET | Freq: Two times a day (BID) | ORAL | Status: DC

## 2013-05-12 NOTE — Telephone Encounter (Signed)
Refilled Flecainide sent to [facility] pharmacy.

## 2013-05-27 ENCOUNTER — Encounter: Payer: Self-pay | Admitting: Cardiovascular Disease

## 2013-05-27 ENCOUNTER — Ambulatory Visit (INDEPENDENT_AMBULATORY_CARE_PROVIDER_SITE_OTHER): Payer: Medicare PPO | Admitting: Cardiovascular Disease

## 2013-05-27 VITALS — BP 128/52 | HR 63 | Ht 65.0 in | Wt 174.2 lb

## 2013-05-27 DIAGNOSIS — E785 Hyperlipidemia, unspecified: Secondary | ICD-10-CM

## 2013-05-27 DIAGNOSIS — I4891 Unspecified atrial fibrillation: Secondary | ICD-10-CM

## 2013-05-27 DIAGNOSIS — I10 Essential (primary) hypertension: Secondary | ICD-10-CM

## 2013-05-27 NOTE — Assessment & Plan Note (Signed)
Rare episodes. Continue current medication regimen. If she has recurrent atrial fibrillation that is long lasting, she could take extra flecainide for conversion.

## 2013-05-27 NOTE — Assessment & Plan Note (Signed)
We have suggested she stay on her generic Lipitor.

## 2013-05-27 NOTE — Progress Notes (Signed)
Patient ID: Hailey Simmons, female    DOB: 1936/06/06, 77 y.o.   MRN: [id number]  HPI Comments: Hailey Simmons is a very pleasant 77 year old woman, patient of Dr. Bethann Punches, with history of hypothyroidism, hyperlipidemia , paroxysmal atrial fibrillation. In January 2013 with Dr. Hyacinth Meeker  was noted at that time to have atrial fibrillation. Echocardiogram showed normal ejection fraction, otherwise normal study.  At that time she was in atrial fibrillation. Diltiazem 120 mg daily was started.   diltiazem was increased to 180 mg daily and she was started on flecainide 50 mg twice a day for continued symptoms.  On this regimen, she has felt well with minimal breakthrough arrhythmia.  She reports possibly having a short episode of atrial fibrillation in January and possibly April 2014, lasting less than 30 minutes. Otherwise she feels well with no complaints. She takes care of 3 grandchildren several days per week.  No chest pain, no significant shortness of breath, no edema or lightheadedness.    Blood pressure at home has been in the 130 to 140 range systolic range  She does have a strong family history of coronary artery disease, father 50 mg twice a day of heart attack at age 55, mother at age 21 both from cardiac disease.  On Lipitor 10 mg daily, total cholesterol now 143 down from 207 EKG shows normal sinus rhythm with right bundle branch block, rate 63 beats per minute   Outpatient Encounter Prescriptions as of 05/27/2013  Medication Sig Dispense Refill  . aspirin 81 MG tablet Take 81 mg by mouth daily.      Marland Kitchen atorvastatin (LIPITOR) 10 MG tablet Take 1 tablet (10 mg total) by mouth daily.  30 tablet  3  . calcium carbonate (OS-CAL) 600 MG TABS Take 600 mg by mouth 2 (two) times daily with a meal.      . Cholecalciferol (VITAMIN D3) 2000 UNITS TABS Take 1 tablet by mouth daily.      Marland Kitchen diltiazem (CARDIZEM CD) 180 MG 24 hr capsule Take 1 capsule (180 mg total) by mouth daily.  30 capsule  11  .  flecainide (TAMBOCOR) 50 MG tablet Take 1 tablet (50 mg total) by mouth 2 (two) times daily.  60 tablet  3  . hydrochlorothiazide (HYDRODIURIL) 25 MG tablet Take 1 tablet (25 mg total) by mouth daily.  90 tablet  3  . levothyroxine (SYNTHROID, LEVOTHROID) 100 MCG tablet Take 100 mcg by mouth daily.      Marland Kitchen losartan (COZAAR) 50 MG tablet Take 1 tablet (50 mg total) by mouth daily.  90 tablet  3    Review of Systems  Constitutional: Negative.   HENT: Negative.   Eyes: Negative.   Respiratory: Negative.   Cardiovascular: Negative.   Gastrointestinal: Negative.   Musculoskeletal: Negative.   Skin: Negative.   Neurological: Negative.   Psychiatric/Behavioral: Negative.   All other systems reviewed and are negative.    BP 128/52  Pulse 63  Ht 5\' 5"  (1.651 m)  Wt 174 lb 4 oz (79.039 kg)  BMI 29 kg/m2  Physical Exam  Nursing note and vitals reviewed. Constitutional: She is oriented to person, place, and time. She appears well-developed and well-nourished.  HENT:  Head: Normocephalic.  Nose: Nose normal.  Mouth/Throat: Oropharynx is clear and moist.  Eyes: Conjunctivae are normal. Pupils are equal, round, and reactive to light.  Neck: Normal range of motion. Neck supple. No JVD present.  Cardiovascular: Normal rate, regular rhythm, S1 normal, S2 normal, normal  heart sounds and intact distal pulses.  Exam reveals no gallop and no friction rub.   No murmur heard. Pulmonary/Chest: Effort normal and breath sounds normal. No respiratory distress. She has no wheezes. She has no rales. She exhibits no tenderness.  Abdominal: Soft. Bowel sounds are normal. She exhibits no distension. There is no tenderness.  Musculoskeletal: Normal range of motion. She exhibits no edema and no tenderness.  Lymphadenopathy:    She has no cervical adenopathy.  Neurological: She is alert and oriented to person, place, and time. Coordination normal.  Skin: Skin is warm and dry. No rash noted. No erythema.   Psychiatric: She has a normal mood and affect. Her behavior is normal. Judgment and thought content normal.    Assessment and Plan

## 2013-05-27 NOTE — Patient Instructions (Addendum)
You are doing well. No medication changes were made.  Please call us if you have new issues that need to be addressed before your next appt.  Your physician wants you to follow-up in: 12 months.  You will receive a reminder letter in the mail two months in advance. If you don't receive a letter, please call our office to schedule the follow-up appointment. 

## 2013-05-27 NOTE — Assessment & Plan Note (Signed)
Blood pressure is well controlled on today's visit. No changes made to the medications. 

## 2013-07-31 ENCOUNTER — Other Ambulatory Visit: Payer: Self-pay

## 2013-07-31 MED ORDER — ATORVASTATIN CALCIUM 10 MG PO TABS: 10.0000 mg | ORAL_TABLET | Freq: Every day | ORAL | Status: DC

## 2013-07-31 NOTE — Telephone Encounter (Signed)
Refill sent for atorvastatin. 

## 2013-09-17 ENCOUNTER — Other Ambulatory Visit: Payer: Self-pay

## 2013-09-17 DIAGNOSIS — I10 Essential (primary) hypertension: Secondary | ICD-10-CM

## 2013-09-17 DIAGNOSIS — I4891 Unspecified atrial fibrillation: Secondary | ICD-10-CM

## 2013-09-17 MED ORDER — FLECAINIDE ACETATE 50 MG PO TABS: 50.0000 mg | ORAL_TABLET | Freq: Two times a day (BID) | ORAL | Status: DC

## 2013-10-21 ENCOUNTER — Ambulatory Visit (INDEPENDENT_AMBULATORY_CARE_PROVIDER_SITE_OTHER): Payer: Medicare PPO | Admitting: Cardiovascular Disease

## 2013-10-21 ENCOUNTER — Encounter: Payer: Self-pay | Admitting: Cardiovascular Disease

## 2013-10-21 VITALS — BP 172/82 | HR 79 | Ht 64.0 in | Wt 175.5 lb

## 2013-10-21 DIAGNOSIS — J069 Acute upper respiratory infection, unspecified: Secondary | ICD-10-CM

## 2013-10-21 DIAGNOSIS — I10 Essential (primary) hypertension: Secondary | ICD-10-CM

## 2013-10-21 DIAGNOSIS — R0602 Shortness of breath: Secondary | ICD-10-CM

## 2013-10-21 DIAGNOSIS — E785 Hyperlipidemia, unspecified: Secondary | ICD-10-CM

## 2013-10-21 DIAGNOSIS — I4891 Unspecified atrial fibrillation: Secondary | ICD-10-CM

## 2013-10-21 DIAGNOSIS — R000 Tachycardia, unspecified: Secondary | ICD-10-CM

## 2013-10-21 MED ORDER — DILTIAZEM HCL 30 MG PO TABS: 30.0000 mg | ORAL_TABLET | Freq: Four times a day (QID) | ORAL | Status: DC | PRN

## 2013-10-21 NOTE — Assessment & Plan Note (Signed)
She reports having episodes of atrial fibrillation at nighttime. We have suggested before bed she take extra flecainide 50 mg for total of 100 mg each bedtime, also take diltiazem 30 mg in addition to her 180 mg in the morning.

## 2013-10-21 NOTE — Progress Notes (Signed)
Patient ID: Hailey Simmons, female    DOB: 1936/09/26, 77 y.o.   MRN: [id number]  HPI Comments: Hailey Simmons is a very pleasant 77 year old woman, patient of Dr. Bethann Punches, with history of hypothyroidism, hyperlipidemia , paroxysmal atrial fibrillation. In January 2013 with Dr. Hyacinth Meeker  was noted at that time to have atrial fibrillation. Echocardiogram showed normal ejection fraction, otherwise normal study.  At that time she was in atrial fibrillation. Diltiazem 120 mg daily was started.   diltiazem was increased to 180 mg daily and she was started on flecainide 50 mg twice a day for continued symptoms.    short episode of atrial fibrillation in January and possibly April 2014, lasting less than 30 minutes. She is typically very active, takes care of 3 grandchildren several days per week.  In general has had No chest pain, no significant shortness of breath, no edema or lightheadedness.   In followup today, she reports that she has had a very difficult time since the beginning of October 2014. Symptoms started with mucus and coughing. She's given antibiotics and Mucinex. She had continued symptoms of wheezing and shortness of breath. She was given prednisone and inhalers. Since then symptoms have persisted in her nasal passages with cough and fullness in her throat. Started again on Mucinex, Singulair, Chlor-Trimeton. Now has insomnia, decreased appetite. She reports having episodes of atrial fibrillation almost every night lasting for several hours at a time, no significant symptoms in the daytime. Blood pressure has been running higher, typically in the evening and overnight, better in the morning   strong family history of coronary artery disease, father 50 mg twice a day of heart attack at age 30, mother at age 41 both from cardiac disease.  On Lipitor 10 mg daily, total cholesterol now 143 down from 207 EKG shows normal sinus rhythm with right bundle branch block, rate 79 beats per  minute   Outpatient Encounter Prescriptions as of 10/21/2013  Medication Sig  . aspirin 81 MG tablet Take 81 mg by mouth daily.  Marland Kitchen atorvastatin (LIPITOR) 10 MG tablet Take 1 tablet (10 mg total) by mouth daily.  . calcium carbonate (OS-CAL) 600 MG TABS Take 600 mg by mouth 2 (two) times daily with a meal.  . Cholecalciferol (VITAMIN D3) 2000 UNITS TABS Take 1 tablet by mouth daily.  Marland Kitchen diltiazem (CARDIZEM CD) 180 MG 24 hr capsule Take 1 capsule (180 mg total) by mouth daily.  . flecainide (TAMBOCOR) 50 MG tablet Take 1 tablet (50 mg total) by mouth 2 (two) times daily.  . hydrochlorothiazide (HYDRODIURIL) 25 MG tablet Take 1 tablet (25 mg total) by mouth daily.  Marland Kitchen levothyroxine (SYNTHROID, LEVOTHROID) 100 MCG tablet Take 100 mcg by mouth daily.  Marland Kitchen losartan (COZAAR) 50 MG tablet Take 1 tablet (50 mg total) by mouth daily.    Review of Systems  Constitutional: Negative.   HENT: Positive for postnasal drip and sinus pressure.   Eyes: Negative.   Respiratory: Positive for cough.   Cardiovascular: Negative.   Gastrointestinal: Negative.   Endocrine: Negative.   Musculoskeletal: Negative.   Skin: Negative.   Allergic/Immunologic: Negative.   Neurological: Negative.   Hematological: Negative.   Psychiatric/Behavioral: Negative.   All other systems reviewed and are negative.    BP 172/82  Pulse 79  Ht 5\' 4"  (1.626 m)  Wt 175 lb 8 oz (79.606 kg)  BMI 30.11 kg/m2  Physical Exam  Nursing note and vitals reviewed. Constitutional: She is oriented to person, place, and  time. She appears well-developed and well-nourished.  HENT:  Head: Normocephalic.  Nose: Nose normal.  Mouth/Throat: Oropharynx is clear and moist.  Eyes: Conjunctivae are normal. Pupils are equal, round, and reactive to light.  Neck: Normal range of motion. Neck supple. No JVD present.  Cardiovascular: Normal rate, regular rhythm, S1 normal, S2 normal, normal heart sounds and intact distal pulses.  Exam reveals no  gallop and no friction rub.   No murmur heard. Pulmonary/Chest: Effort normal and breath sounds normal. No respiratory distress. She has no wheezes. She has no rales. She exhibits no tenderness.  Abdominal: Soft. Bowel sounds are normal. She exhibits no distension. There is no tenderness.  Musculoskeletal: Normal range of motion. She exhibits no edema and no tenderness.  Lymphadenopathy:    She has no cervical adenopathy.  Neurological: She is alert and oriented to person, place, and time. Coordination normal.  Skin: Skin is warm and dry. No rash noted. No erythema.  Psychiatric: She has a normal mood and affect. Her behavior is normal. Judgment and thought content normal.    Assessment and Plan

## 2013-10-21 NOTE — Assessment & Plan Note (Signed)
Suggested she continue on her Lipitor 

## 2013-10-21 NOTE — Assessment & Plan Note (Signed)
I suspect she has a viral URI. Minimal improvement with antibiotics. Suggested we continue supportive measures. For her insomnia, she could try Benadryl and melatonin. Suggested she continue with her over-the-counter medications for sinus congestion

## 2013-10-21 NOTE — Patient Instructions (Signed)
For high blood pressure, take extra losartan 50 mg (total of 100 mg a day), Ok to take with diltiazem 30 mg pills up to 4 times a day  For atrial fibrillation overnight,  Take flecainide 100 mg at night, Also take diltiazem 30 mg pill   Try benadryl 25 to 50 mg at night for sleep  Please call us if you have new issues that need to be addressed before your next appt.  Your physician wants you to follow-up in: 6 months.  You will receive a reminder letter in the mail two months in advance. If you don't receive a letter, please call our office to schedule the follow-up appointment.

## 2013-10-21 NOTE — Assessment & Plan Note (Signed)
Blood pressure running high in the evenings. Suggested she take extra losartan 50 mg as needed. Also given her a prescription for diltiazem 30 mg pills as needed

## 2014-01-01 ENCOUNTER — Ambulatory Visit: Payer: Self-pay | Admitting: Internal Medicine

## 2014-01-22 ENCOUNTER — Other Ambulatory Visit: Payer: Self-pay | Admitting: *Deleted

## 2014-01-22 DIAGNOSIS — I10 Essential (primary) hypertension: Secondary | ICD-10-CM

## 2014-01-22 DIAGNOSIS — I4891 Unspecified atrial fibrillation: Secondary | ICD-10-CM

## 2014-01-22 MED ORDER — FLECAINIDE ACETATE 50 MG PO TABS: 50.0000 mg | ORAL_TABLET | Freq: Two times a day (BID) | ORAL | Status: DC

## 2014-01-22 NOTE — Telephone Encounter (Signed)
Requested Prescriptions   Signed Prescriptions Disp Refills  . flecainide (TAMBOCOR) 50 MG tablet 60 tablet 4    Sig: Take 1 tablet (50 mg total) by mouth 2 (two) times daily.    Authorizing Provider: [name]    Ordering User: Britt Bottom

## 2014-05-28 ENCOUNTER — Ambulatory Visit (INDEPENDENT_AMBULATORY_CARE_PROVIDER_SITE_OTHER): Payer: Medicare PPO | Admitting: Cardiovascular Disease

## 2014-05-28 ENCOUNTER — Encounter: Payer: Self-pay | Admitting: Cardiovascular Disease

## 2014-05-28 VITALS — BP 120/60 | HR 60 | Ht 61.0 in | Wt 177.0 lb

## 2014-05-28 DIAGNOSIS — I480 Paroxysmal atrial fibrillation: Secondary | ICD-10-CM

## 2014-05-28 DIAGNOSIS — E785 Hyperlipidemia, unspecified: Secondary | ICD-10-CM

## 2014-05-28 DIAGNOSIS — I10 Essential (primary) hypertension: Secondary | ICD-10-CM

## 2014-05-28 DIAGNOSIS — I4891 Unspecified atrial fibrillation: Secondary | ICD-10-CM

## 2014-05-28 MED ORDER — FLECAINIDE ACETATE 50 MG PO TABS: 75.0000 mg | ORAL_TABLET | Freq: Two times a day (BID) | ORAL | Status: DC

## 2014-05-28 MED ORDER — PROPRANOLOL HCL 20 MG PO TABS: 20.0000 mg | ORAL_TABLET | Freq: Three times a day (TID) | ORAL | Status: DC | PRN

## 2014-05-28 NOTE — Assessment & Plan Note (Signed)
Blood pressure is well controlled on today's visit. No changes made to the medications. 

## 2014-05-28 NOTE — Progress Notes (Signed)
Patient ID: Hailey Simmons, female    DOB: May 26, 1936, 78 y.o.   MRN: [id number]  HPI Comments: Hailey Simmons is a very pleasant 78 year old woman, patient of Dr. Emily Filbert, with history of hypothyroidism, hyperlipidemia , paroxysmal atrial fibrillation. In January 2013 with Dr. Sabra Heck  was noted at that time to have atrial fibrillation. Echocardiogram showed normal ejection fraction, otherwise normal study.  At that time she was in atrial fibrillation. Diltiazem 120 mg daily was started.   diltiazem was increased to 180 mg daily and she was started on flecainide 50 mg twice a day for continued symptoms.   short episode of atrial fibrillation in January and possibly April 2014, lasting less than 30 minutes.  In followup today, she reports that she feels well. She had episode of atrial fibrillation June 3 in 05/23/2014. First episode was 4 hours, second episode lasted much longer. She takes extra flecainide and diltiazem 30 mg by mouth. Otherwise no complaints. She is typically very active, takes care of 3 grandchildren several days per week.  No chest pain, no significant shortness of breath, no edema or lightheadedness.    strong family history of coronary artery disease, father 66 mg twice a day of heart attack at age 64, mother at age 22 both from cardiac disease.  Tolerating Lipitor 10 mg daily EKG shows normal sinus rhythm with right bundle branch block, rate 60 beats per minute   Outpatient Encounter Prescriptions as of 05/28/2014  Medication Sig  . aspirin 81 MG tablet Take 81 mg by mouth daily.  Marland Kitchen atorvastatin (LIPITOR) 10 MG tablet Take 1 tablet (10 mg total) by mouth daily.  . calcium carbonate (OS-CAL) 600 MG TABS Take 600 mg by mouth 2 (two) times daily with a meal.  . Cholecalciferol (VITAMIN D3) 2000 UNITS TABS Take 1 tablet by mouth daily.  Marland Kitchen diltiazem (CARDIZEM CD) 180 MG 24 hr capsule Take 1 capsule (180 mg total) by mouth daily.  Marland Kitchen diltiazem (CARDIZEM) 30 MG tablet Take 1  tablet (30 mg total) by mouth 4 (four) times daily as needed.  Marland Kitchen ESTRACE VAGINAL 0.1 MG/GM vaginal cream Place 1 Applicatorful vaginally as needed.   . flecainide (TAMBOCOR) 50 MG tablet Take 1 tablet (50 mg total) by mouth 2 (two) times daily.  . hydrochlorothiazide (HYDRODIURIL) 25 MG tablet Take 1 tablet (25 mg total) by mouth daily.  Marland Kitchen levothyroxine (SYNTHROID, LEVOTHROID) 100 MCG tablet Take 100 mcg by mouth daily.  Marland Kitchen losartan (COZAAR) 50 MG tablet Take 1 tablet (50 mg total) by mouth daily.    Review of Systems  Constitutional: Negative.   HENT: Negative.   Eyes: Negative.   Respiratory: Negative.   Cardiovascular: Negative.   Gastrointestinal: Negative.   Endocrine: Negative.   Musculoskeletal: Negative.   Skin: Negative.   Allergic/Immunologic: Negative.   Neurological: Negative.   Hematological: Negative.   Psychiatric/Behavioral: Negative.   All other systems reviewed and are negative.   BP 120/60  Pulse 60  Ht 5\' 1"  (1.549 m)  Wt 177 lb (80.287 kg)  BMI 33.46 kg/m2  Physical Exam  Nursing note and vitals reviewed. Constitutional: She is oriented to person, place, and time. She appears well-developed and well-nourished.  HENT:  Head: Normocephalic.  Nose: Nose normal.  Mouth/Throat: Oropharynx is clear and moist.  Eyes: Conjunctivae are normal. Pupils are equal, round, and reactive to light.  Neck: Normal range of motion. Neck supple. No JVD present.  Cardiovascular: Normal rate, regular rhythm, S1 normal, S2 normal, normal  heart sounds and intact distal pulses.  Exam reveals no gallop and no friction rub.   No murmur heard. Pulmonary/Chest: Effort normal and breath sounds normal. No respiratory distress. She has no wheezes. She has no rales. She exhibits no tenderness.  Abdominal: Soft. Bowel sounds are normal. She exhibits no distension. There is no tenderness.  Musculoskeletal: Normal range of motion. She exhibits no edema and no tenderness.  Lymphadenopathy:     She has no cervical adenopathy.  Neurological: She is alert and oriented to person, place, and time. Coordination normal.  Skin: Skin is warm and dry. No rash noted. No erythema.  Psychiatric: She has a normal mood and affect. Her behavior is normal. Judgment and thought content normal.    Assessment and Plan

## 2014-05-28 NOTE — Assessment & Plan Note (Signed)
Encouraged her to stay on her Lipitor 

## 2014-05-28 NOTE — Patient Instructions (Signed)
You are doing well.  For atrial fibrillation breakthrough, Take extra flecainide and try propranolol If afib continues after a few hours,  Take extra flecainide and diltiazem 30   Please call us if you have new issues that need to be addressed before your next appt.  Your physician wants you to follow-up in: 6 months.  You will receive a reminder letter in the mail two months in advance. If you don't receive a letter, please call our office to schedule the follow-up appointment.

## 2014-05-28 NOTE — Assessment & Plan Note (Addendum)
Rare episodes of atrial fibrillation. Options were discussed with her. We have suggested she continue to take extra flecainide for breakthrough atrial fibrillation. Prescription for propranolol 20 mg given to her to take as needed with flecainide. She can also take diltiazem 30 mg pills as needed. If frequency and duration of episodes increases, could increase flecainide up to 100 mg twice a day

## 2014-06-02 ENCOUNTER — Telehealth: Payer: Self-pay

## 2014-06-02 NOTE — Telephone Encounter (Signed)
Pt has a question regarding her Flecainide and Propranolol. Please call.

## 2014-06-02 NOTE — Telephone Encounter (Signed)
Spoke w/ pt.  She wanted to clarify as to when to take her meds.  She will call back w/ further questions or concerns.

## 2014-06-11 ENCOUNTER — Telehealth: Payer: Self-pay

## 2014-06-11 ENCOUNTER — Encounter: Payer: Self-pay | Admitting: Nurse Practitioner

## 2014-06-11 ENCOUNTER — Ambulatory Visit (INDEPENDENT_AMBULATORY_CARE_PROVIDER_SITE_OTHER): Payer: Medicare PPO | Admitting: Nurse Practitioner

## 2014-06-11 ENCOUNTER — Other Ambulatory Visit: Payer: Self-pay | Admitting: Nurse Practitioner

## 2014-06-11 VITALS — BP 140/64 | HR 57 | Ht 62.0 in | Wt 178.2 lb

## 2014-06-11 DIAGNOSIS — I499 Cardiac arrhythmia, unspecified: Secondary | ICD-10-CM

## 2014-06-11 DIAGNOSIS — I480 Paroxysmal atrial fibrillation: Secondary | ICD-10-CM

## 2014-06-11 DIAGNOSIS — Z79899 Other long term (current) drug therapy: Secondary | ICD-10-CM

## 2014-06-11 DIAGNOSIS — I4891 Unspecified atrial fibrillation: Secondary | ICD-10-CM

## 2014-06-11 DIAGNOSIS — I9589 Other hypotension: Secondary | ICD-10-CM

## 2014-06-11 DIAGNOSIS — I498 Other specified cardiac arrhythmias: Secondary | ICD-10-CM

## 2014-06-11 DIAGNOSIS — T50904A Poisoning by unspecified drugs, medicaments and biological substances, undetermined, initial encounter: Secondary | ICD-10-CM

## 2014-06-11 DIAGNOSIS — R001 Bradycardia, unspecified: Secondary | ICD-10-CM

## 2014-06-11 DIAGNOSIS — I952 Hypotension due to drugs: Secondary | ICD-10-CM

## 2014-06-11 LAB — BASIC METABOLIC PANEL
Anion Gap: 7 (ref 7–16)
BUN: 17 mg/dL (ref 7–18)
CALCIUM: 9.4 mg/dL (ref 8.5–10.1)
CO2: 30 mmol/L (ref 21–32)
CREATININE: 0.88 mg/dL (ref 0.60–1.30)
Chloride: 95 mmol/L — ABNORMAL LOW (ref 98–107)
EGFR (African American): >60
Glucose: 101 mg/dL — ABNORMAL HIGH (ref 65–99)
OSMOLALITY: 266 (ref 275–301)
POTASSIUM: 3.6 mmol/L (ref 3.5–5.1)
SODIUM: 132 mmol/L — AB (ref 136–145)

## 2014-06-11 MED ORDER — FLECAINIDE ACETATE 100 MG PO TABS: 100.0000 mg | ORAL_TABLET | Freq: Two times a day (BID) | ORAL | Status: DC

## 2014-06-11 MED ORDER — APIXABAN 5 MG PO TABS: 5.0000 mg | ORAL_TABLET | Freq: Two times a day (BID) | ORAL | Status: DC

## 2014-06-11 NOTE — Patient Instructions (Addendum)
Your physician has recommended you make the following change in your medication:  Increase Flecainide 100 mg twice daily  Stop Propranolol  Stop Aspirin  Start Eliquis 5 mg twice daily   Your physician recommends that you return for lab work in:  BMP today  CBC when you return for your exercise tolerance test   Your physician has requested that you have an exercise tolerance test with Dr. Rockey Situ  -Wear tennis shoes and "gym cloths" -You can eat a light breakfast  - you can take all of your medications

## 2014-06-11 NOTE — Telephone Encounter (Signed)
Spoke w/ pt.  She reports that she felt that her heart was out of rhythm last night around 10:45.  She took her flecainide and diltiazem around 11:00, propranolol at 2:30am, flecainie & diltiazem at 6:30am.  Feels weak this am, BP 96/51 HR 32, on repeat 107/81, HR 44. Pt sched to see Ignacia Bayley, NP this afternoon at 1:15.

## 2014-06-11 NOTE — Progress Notes (Signed)
Patient Name: Hailey Simmons Date of Encounter: 06/11/2014  Primary Care Provider:  Rusty Aus., MD Primary Cardiologist:  Dr. Rockey Situ, MD  Patient Profile  78 year old female with history of paroxysmal a fib (CHADS2VASc=3, not on anticoagulant, on flecainide and dilt with prn propranolol) and HTN presents to clinic for irregular heart rate, bradycardia, hypotension occuring the previous evening.   Problem List   Past Medical History  Diagnosis Date  . Hypothyroidism   . Goiter   . Fibrocystic breast disease   . Small bowel obstruction 08/2009  . PAF (paroxysmal atrial fibrillation)     a. Not on oral anticoagulant-->CHA2DS2VASc = 3;  b. On flecainide and dilt with prn propranolol.  . Asthma     a. ? 2/2 wood stove usage.  . Menopause    Past Surgical History  Procedure Laterality Date  . Unremarkable      Allergies  No Known Allergies  HPI  78 year old female with the above problem list presents to clinic after having an episode of a fib the previous evening. She currently is directed to take flecainide 50 mg bid, diltiazem 180 mg CD daily for her a fib. She has diltiazem 30 mg and propranolol 20 mg tid for prn usage if she gets into a fib.   She presented to Dr. Rockey Situ in 2013 and was found to have a nl echo at that time. EF > 60%. She was started on flecainide and briefly Xarelto, however she stopped the Xarelto 2/2 to the TV commercials. She was placed on asa in place and has remained on this since that time and done well.   She was recently seen by Dr. Rockey Situ on 05/28/14 and reported had an episode on a fib on 6/3 and 6/21. She reports her episodes of a fib previously lasted approx 4 hours, but lately have lasted as long as 9 hours.   This past evening she was in her usual state of health until around 11 PM when she felt herself go into a fib. She took her usual dose of evening flecainide 50 and her usual daily dose of diltiazem 180 mg CD (she takes this at nighttime).  Around 2:30 AM she still felt like she was in a fib so she took another dose of flecainide 50 mg and propranolol 20 mg. At 4:30 AM she felt a little better and thought she was getting into a better rhythm so did not take anything. At 6:30 AM she continued to feel like she was in a fib so took another flecainide 50 mg and diltiazem 30 mg. Her blood pressure readings 9:30 AM 77/51 HR 32, 10:00 AM 96/51 32, 10:30 101/46 HR 39, 11:00 130/64 HR 51, 12:00 136/73 HR 54. She reports around 10:45 her heart went back into rhythm. In between taking medication she was able to sleep. She denies any chest pain or SOB. Complains of just feeling weak.    Home Medications  Prior to Admission medications   Medication Sig Start Date End Date Taking? Authorizing Provider  aspirin 81 MG tablet Take 81 mg by mouth daily.   Yes Historical Provider, MD  atorvastatin (LIPITOR) 10 MG tablet Take 1 tablet (10 mg total) by mouth daily. 07/31/13 07/31/14 Yes [name], MD  calcium carbonate (OS-CAL) 600 MG TABS Take 600 mg by mouth 2 (two) times daily with a meal.   Yes Historical Provider, MD  Cholecalciferol (VITAMIN D3) 2000 UNITS TABS Take 1 tablet by mouth daily.  Yes Historical Provider, MD  diltiazem (CARDIZEM CD) 180 MG 24 hr capsule Take 1 capsule (180 mg total) by mouth daily. 02/18/13 06/11/14 Yes [name], MD  diltiazem (CARDIZEM) 30 MG tablet Take 1 tablet (30 mg total) by mouth 4 (four) times daily as needed. 10/21/13  Yes [name], MD  ESTRACE VAGINAL 0.1 MG/GM vaginal cream Place 1 Applicatorful vaginally as needed.  05/05/14  Yes Historical Provider, MD  hydrochlorothiazide (HYDRODIURIL) 25 MG tablet Take 1 tablet (25 mg total) by mouth daily. 03/19/13  Yes [name], MD  levothyroxine (SYNTHROID, LEVOTHROID) 100 MCG tablet Take 100 mcg by mouth daily.   Yes Historical Provider, MD  losartan (COZAAR) 50 MG tablet Take 1 tablet (50 mg total) by mouth daily. 03/19/13  Yes [name], MD     Review of Systems  All other systems reviewed and are otherwise negative except as noted above.  Physical Exam  Blood pressure 140/64, pulse 57, height 5\' 2"  (1.575 m), weight 178 lb 4 oz (80.854 kg).  General: Pleasant, NAD Psych: Normal affect. Neuro: Alert and oriented X 3. Moves all extremities spontaneously. HEENT: Normal  Neck: Supple without bruits or JVD. Lungs:  Resp regular and unlabored, CTA. Heart: RRR no s3, s4, or murmurs. Abdomen: Soft, non-tender, non-distended, BS + x 4.  Extremities: No clubbing, cyanosis or edema. DP/PT/Radials 2+ and equal bilaterally.  Accessory Clinical Findings  ECG - NSR, RBBB, 57, no st/t changes  Assessment & Plan  1.  Paroxysmal atrial fib: She has had 3 episodes of atrial fib since the start of June (6/3, 6/21, and now overnight the previous night). Given the frequent number of episodes of atrial fibrillation she is having we will increase her flecainide to 100 mg bid. Given that we are increasing her flecainide dose we will also check an ETT in 2 weeks. By doing this we are hoping to keep her in NSR and prevent her from having to take any prn medications. With this change and given her significant hypotension and bradycardia we will stop her propranolol at this time. Continue the diltiazem 180 mg CD daily and diltiazem 30 mg prn PAF. Should she continue to have PAF we will need to reassess her therapy as the flecainide would not be controlling her a fib at that point. Her CHADS2VASc=3. Previously on Xarelto but she did not like the TV commercials. Discussed with her the treatment options available. Also discussed her case with Dr. Rockey Situ.  She decided to go ahead to start eliquis today. Samples were given. BMET today (creat prev nl).  Recheck CBC in 2 weeks when she returns for her exercise treadmill test. Stop aspirin, no known history of CAD.   2. Hypotension/bradycardia: She combined all of her a fib medications quite close together over  the evening. With her taking all of these medications so close together it dropped her blood pressure and pulse down to the levels that she saw at home. As the morning drew to a close she was able to record values back to her baseline at home. Vitals are at baseline in the office as well. We have stopped her propranolol, see above.    3. Dispo: Follow up 2 weeks with Dr. Morrell Riddle, NP 06/11/2014, 2:23 PM

## 2014-06-11 NOTE — Telephone Encounter (Signed)
Pt states "my heart has been out of rhythm most of the night, I took a Flecainide at 6:30 last night", BP is 96/51, HR is 32, states she feels "totally washed out".

## 2014-06-22 ENCOUNTER — Ambulatory Visit (INDEPENDENT_AMBULATORY_CARE_PROVIDER_SITE_OTHER): Payer: Medicare PPO | Admitting: *Deleted

## 2014-06-22 ENCOUNTER — Ambulatory Visit (INDEPENDENT_AMBULATORY_CARE_PROVIDER_SITE_OTHER): Payer: Medicare PPO | Admitting: Cardiovascular Disease

## 2014-06-22 ENCOUNTER — Encounter: Payer: Self-pay | Admitting: Cardiovascular Disease

## 2014-06-22 VITALS — BP 166/69 | HR 64 | Ht 62.0 in | Wt 179.0 lb

## 2014-06-22 DIAGNOSIS — Z79899 Other long term (current) drug therapy: Secondary | ICD-10-CM

## 2014-06-22 NOTE — Patient Instructions (Signed)
We will call you with results  

## 2014-06-22 NOTE — Patient Instructions (Signed)
We will call you with the results of your treadmill test.

## 2014-06-22 NOTE — Procedures (Signed)
Exercise Treadmill Test   Treadmill ordered for recent epsiodes of chest pain.  Resting EKG shows NSR with rate of 70 bpm,  Resting blood pressure of 166/96 Stand bruce protocal was used.  Patient exercised for 3 min 39 sec,  Peak heart rate of 125 bpm.  This was 86% of the maximum predicted heart rate (target heart rate 150). No symptoms of chest pain or lightheadedness were reported at peak stress or in recovery.  She did have significant shortness of breath Peak Blood pressure recorded was 217/63. Heart rate at 3 minutes in recovery was 77 bpm. No significant ST changes concerning for ischemia. Significant artifact at peak stress.  FINAL IMPRESSION: Normal exercise stress test. Target heart rate achieved No arrhythmia noted Poor exercise tolerance. Severe hypertension with exercise. No significant EKG changes concerning for ischemia (though significant artifact at peak stress)

## 2014-06-23 LAB — CBC WITH DIFFERENTIAL
Basophils Absolute: 0 10*3/uL (ref 0.0–0.2)
Basos: 1 %
Eos: 6 %
Eosinophils Absolute: 0.4 10*3/uL (ref 0.0–0.4)
HCT: 39.1 % (ref 34.0–46.6)
HEMOGLOBIN: 12.6 g/dL (ref 11.1–15.9)
IMMATURE GRANULOCYTES: 0 %
Immature Grans (Abs): 0 10*3/uL (ref 0.0–0.1)
LYMPHS ABS: 1.7 10*3/uL (ref 0.7–3.1)
Lymphs: 25 %
MCH: 28.1 pg (ref 26.6–33.0)
MCHC: 32.2 g/dL (ref 31.5–35.7)
MCV: 87 fL (ref 79–97)
MONOCYTES: 7 %
MONOS ABS: 0.5 10*3/uL (ref 0.1–0.9)
Neutrophils Absolute: 4 10*3/uL (ref 1.4–7.0)
Neutrophils Relative %: 61 %
RBC: 4.48 x10E6/uL (ref 3.77–5.28)
RDW: 13.7 % (ref 12.3–15.4)
WBC: 6.6 10*3/uL (ref 3.4–10.8)

## 2014-06-24 ENCOUNTER — Telehealth: Payer: Self-pay | Admitting: *Deleted

## 2014-06-24 DIAGNOSIS — D691 Qualitative platelet defects: Secondary | ICD-10-CM

## 2014-06-24 NOTE — Telephone Encounter (Signed)
Reviewed CBC results with patient  Scheduled lab for patient  Patient verbalized understanding

## 2014-06-25 ENCOUNTER — Ambulatory Visit (INDEPENDENT_AMBULATORY_CARE_PROVIDER_SITE_OTHER): Payer: Medicare PPO | Admitting: *Deleted

## 2014-06-25 DIAGNOSIS — D691 Qualitative platelet defects: Secondary | ICD-10-CM

## 2014-06-29 LAB — CBC WITH DIFFERENTIAL
BASOS ABS: 0 10*3/uL (ref 0.0–0.2)
Basos: 0 %
Eos: 6 %
Eosinophils Absolute: 0.4 10*3/uL (ref 0.0–0.4)
HCT: 37.9 % (ref 34.0–46.6)
Hemoglobin: 12.4 g/dL (ref 11.1–15.9)
Immature Grans (Abs): 0 10*3/uL (ref 0.0–0.1)
Immature Granulocytes: 0 %
LYMPHS: 24 %
Lymphocytes Absolute: 1.6 10*3/uL (ref 0.7–3.1)
MCH: 27.6 pg (ref 26.6–33.0)
MCHC: 32.7 g/dL (ref 31.5–35.7)
MCV: 84 fL (ref 79–97)
MONOCYTES: 6 %
Monocytes Absolute: 0.4 10*3/uL (ref 0.1–0.9)
Neutrophils Absolute: 4.1 10*3/uL (ref 1.4–7.0)
Neutrophils Relative %: 64 %
RBC: 4.5 x10E6/uL (ref 3.77–5.28)
RDW: 13.6 % (ref 12.3–15.4)
WBC: 6.5 10*3/uL (ref 3.4–10.8)

## 2014-06-30 ENCOUNTER — Telehealth: Payer: Medicare PPO

## 2014-06-30 DIAGNOSIS — D691 Qualitative platelet defects: Secondary | ICD-10-CM

## 2014-06-30 NOTE — Telephone Encounter (Signed)
Pt would like lab results.  

## 2014-06-30 NOTE — Telephone Encounter (Signed)
See lab results.  

## 2014-07-01 NOTE — Telephone Encounter (Signed)
Message copied by Tracie Harrier on Thu Jul 01, 2014  2:17 PM ------      Message from: Hailey Simmons      Created: Mon Jun 28, 2014 10:08 AM       Do you know if this platelet count was also drawn in a citrate tube and ran? ------

## 2014-07-01 NOTE — Telephone Encounter (Signed)
Scheduled patient for follow up lab  Patient aware of lab appt date and time

## 2014-07-06 ENCOUNTER — Ambulatory Visit (INDEPENDENT_AMBULATORY_CARE_PROVIDER_SITE_OTHER): Payer: Medicare PPO

## 2014-07-06 DIAGNOSIS — D691 Qualitative platelet defects: Secondary | ICD-10-CM

## 2014-11-19 ENCOUNTER — Ambulatory Visit (INDEPENDENT_AMBULATORY_CARE_PROVIDER_SITE_OTHER): Payer: Medicare PPO | Admitting: Cardiovascular Disease

## 2014-11-19 ENCOUNTER — Encounter: Payer: Self-pay | Admitting: Cardiovascular Disease

## 2014-11-19 VITALS — BP 140/62 | HR 65 | Ht 63.0 in | Wt 173.5 lb

## 2014-11-19 DIAGNOSIS — I4891 Unspecified atrial fibrillation: Secondary | ICD-10-CM

## 2014-11-19 DIAGNOSIS — I480 Paroxysmal atrial fibrillation: Secondary | ICD-10-CM

## 2014-11-19 DIAGNOSIS — E785 Hyperlipidemia, unspecified: Secondary | ICD-10-CM

## 2014-11-19 MED ORDER — DILTIAZEM HCL ER COATED BEADS 180 MG PO CP24: 180.0000 mg | ORAL_CAPSULE | Freq: Every day | ORAL | Status: DC

## 2014-11-19 MED ORDER — ATORVASTATIN CALCIUM 10 MG PO TABS: 10.0000 mg | ORAL_TABLET | Freq: Every day | ORAL | Status: DC

## 2014-11-19 MED ORDER — LOSARTAN POTASSIUM 50 MG PO TABS: 50.0000 mg | ORAL_TABLET | Freq: Every day | ORAL | Status: DC

## 2014-11-19 MED ORDER — APIXABAN 5 MG PO TABS: 5.0000 mg | ORAL_TABLET | Freq: Two times a day (BID) | ORAL | Status: DC

## 2014-11-19 MED ORDER — HYDROCHLOROTHIAZIDE 25 MG PO TABS: 25.0000 mg | ORAL_TABLET | Freq: Every day | ORAL | Status: DC

## 2014-11-19 MED ORDER — FLECAINIDE ACETATE 100 MG PO TABS: 100.0000 mg | ORAL_TABLET | Freq: Two times a day (BID) | ORAL | Status: DC

## 2014-11-19 NOTE — Assessment & Plan Note (Signed)
Cholesterol is at goal on the current lipid regimen. No changes to the medications were made.  

## 2014-11-19 NOTE — Assessment & Plan Note (Signed)
Encouraged her to stay on her current medications. Seems to be doing well following the increased dose of flecainide If she start ciprofloxacin for urinary tract infection, encouraged her to decrease the flecainide down to 50 mg twice a day for that short period

## 2014-11-19 NOTE — Progress Notes (Signed)
Patient ID: Hailey Simmons, female    DOB: 07-07-1936, 78 y.o.   MRN: [id number]  HPI Comments: Hailey Simmons is a very pleasant 78 year old woman, patient of Dr. Emily Filbert, with history of hypothyroidism, hyperlipidemia , paroxysmal atrial fibrillation. In January 2013 with Dr. Sabra Heck  was noted at that time to have atrial fibrillation. Echocardiogram showed normal ejection fraction, otherwise normal study.  At that time she was in atrial fibrillation. Diltiazem 120 mg daily was started. She presents today for follow-up of her atrial fibrillation Recently seen in clinic for atrial fibrillation, flecainide increased up to 100 mg twice a day  Since the flecainide dose was increased, she has had minimal palpitations concerning for arrhythmia. Overall she reports that she is doing well. She denies having any problems with bleeding. Tolerating anticoagulation well She does have periodic urinary tract infections and concerned about mixing ciprofloxacin with flecainide She is typically very active, takes care of 3 grandchildren several days per week.  No chest pain, no significant shortness of breath, no edema or lightheadedness.  Recent lab work showing TSH 3.0, total cholesterol 137, LDL 60  EKG on today's visit shows normal sinus rhythm with rate 65 bpm, no significant ST or T-wave changes, right bundle branch block  Other past medical history  short episode of atrial fibrillation in January and possibly April 2014, lasting less than 30 minutes. atrial fibrillation June 3 in 05/23/2014. First episode was 4 hours, second episode lasted much longer.    strong family history of coronary artery disease, father 33 mg twice a day of heart attack at age 20, mother at age 45 both from cardiac disease. Tolerating Lipitor 10 mg daily    Outpatient Encounter Prescriptions as of 11/19/2014  Medication Sig  . apixaban (ELIQUIS) 5 MG TABS tablet Take 1 tablet (5 mg total) by mouth 2 (two) times daily.  Marland Kitchen  atorvastatin (LIPITOR) 10 MG tablet Take 1 tablet (10 mg total) by mouth daily.  . calcium carbonate (OS-CAL) 600 MG TABS Take 600 mg by mouth 2 (two) times daily with a meal.  . Cholecalciferol (VITAMIN D3) 2000 UNITS TABS Take 1 tablet by mouth daily.  . Cranberry 1000 MG CAPS Take by mouth daily.  Marland Kitchen diltiazem (CARDIZEM CD) 180 MG 24 hr capsule Take 1 capsule (180 mg total) by mouth daily.  Marland Kitchen diltiazem (CARDIZEM) 30 MG tablet Take 1 tablet (30 mg total) by mouth 4 (four) times daily as needed.  Marland Kitchen ESTRACE VAGINAL 0.1 MG/GM vaginal cream Place 1 Applicatorful vaginally as needed.   . flecainide (TAMBOCOR) 100 MG tablet Take 1 tablet (100 mg total) by mouth 2 (two) times daily.  . hydrochlorothiazide (HYDRODIURIL) 25 MG tablet Take 1 tablet (25 mg total) by mouth daily.  Marland Kitchen levothyroxine (SYNTHROID, LEVOTHROID) 100 MCG tablet Take 100 mcg by mouth daily.  Marland Kitchen losartan (COZAAR) 50 MG tablet Take 1 tablet (50 mg total) by mouth daily.   Social history  reports that she has never smoked. She does not have any smokeless tobacco history on file. She reports that she does not drink alcohol or use illicit drugs.  Review of Systems  Constitutional: Negative.   HENT: Negative.   Respiratory: Negative.   Cardiovascular: Negative.   Gastrointestinal: Negative.   Musculoskeletal: Negative.   Neurological: Negative.   Hematological: Negative.   Psychiatric/Behavioral: Negative.   All other systems reviewed and are negative.   BP 140/62 mmHg  Pulse 65  Ht 5\' 3"  (1.6 m)  Wt 173 lb  8 oz (78.699 kg)  BMI 30.74 kg/m2  Physical Exam  Constitutional: She is oriented to person, place, and time. She appears well-developed and well-nourished.  HENT:  Head: Normocephalic.  Nose: Nose normal.  Mouth/Throat: Oropharynx is clear and moist.  Eyes: Conjunctivae are normal. Pupils are equal, round, and reactive to light.  Neck: Normal range of motion. Neck supple. No JVD present.  Cardiovascular: Normal  rate, regular rhythm, S1 normal, S2 normal, normal heart sounds and intact distal pulses.  Exam reveals no gallop and no friction rub.   No murmur heard. Pulmonary/Chest: Effort normal and breath sounds normal. No respiratory distress. She has no wheezes. She has no rales. She exhibits no tenderness.  Abdominal: Soft. Bowel sounds are normal. She exhibits no distension. There is no tenderness.  Musculoskeletal: Normal range of motion. She exhibits no edema or tenderness.  Lymphadenopathy:    She has no cervical adenopathy.  Neurological: She is alert and oriented to person, place, and time. Coordination normal.  Skin: Skin is warm and dry. No rash noted. No erythema.  Psychiatric: She has a normal mood and affect. Her behavior is normal. Judgment and thought content normal.    Assessment and Plan  Nursing note and vitals reviewed.

## 2014-11-19 NOTE — Patient Instructions (Signed)
You are doing well. No medication changes were made.  Please call us if you have new issues that need to be addressed before your next appt.  Your physician wants you to follow-up in: 6 months.  You will receive a reminder letter in the mail two months in advance. If you don't receive a letter, please call our office to schedule the follow-up appointment.   

## 2015-01-13 ENCOUNTER — Other Ambulatory Visit: Payer: Self-pay | Admitting: Cardiovascular Disease

## 2015-01-20 ENCOUNTER — Ambulatory Visit: Payer: Self-pay | Admitting: Internal Medicine

## 2015-03-22 ENCOUNTER — Telehealth: Payer: Self-pay | Admitting: *Deleted

## 2015-03-22 NOTE — Telephone Encounter (Signed)
Dr. Performing colonoscopy is Dr. Donnella Sham

## 2015-03-22 NOTE — Telephone Encounter (Signed)
°  Request for surgical clearance:  1. What type of surgery is being performed? Colonoscopy   2. When is this surgery scheduled? April 26th  3. Are there any medications that need to be held prior to surgery and how long? 5-7 day (maybe)  4. Name of physician performing surgery?        5.What is your office phone and fax number?   Pt will call back to find out who and where the number is.Marland Kitchen

## 2015-03-23 NOTE — Telephone Encounter (Signed)
Ok to hold Eliquis 2 days prior to procedure. Restart 24 hours post procedure or when able to per GI.

## 2015-03-23 NOTE — Telephone Encounter (Signed)
Notified patient per Christell Faith, PA, patient can hold the Eliquis 2 days prior to the procedure and she can resume 24 hours after or depending on GI. Patient understands instructions.

## 2015-03-23 NOTE — Telephone Encounter (Signed)
Left message for pt to call back  °

## 2015-03-25 ENCOUNTER — Telehealth: Payer: Self-pay

## 2015-03-25 NOTE — Telephone Encounter (Signed)
Received cardiac clearance request for pt to have colonoscopy on 03/29/15 and request to hold Eliquis.  Per Christell Faith, PA, "Hold Eliquis x 2 days prior to procedure.  Restart 24 hrs post  Procedure or when ok per GI." Faxed [facility] at [phone].

## 2015-03-29 ENCOUNTER — Ambulatory Visit
Admit: 2015-03-29 | Disposition: A | Discharge status: Home / Self Care | Payer: Self-pay | Attending: Gastroenterology | Admitting: Gastroenterology

## 2015-03-30 LAB — SURGICAL PATHOLOGY

## 2015-05-20 ENCOUNTER — Encounter: Payer: Self-pay | Admitting: Cardiovascular Disease

## 2015-05-20 ENCOUNTER — Ambulatory Visit (INDEPENDENT_AMBULATORY_CARE_PROVIDER_SITE_OTHER): Payer: PPO | Admitting: Cardiovascular Disease

## 2015-05-20 VITALS — BP 136/62 | HR 61 | Ht 65.0 in | Wt 176.0 lb

## 2015-05-20 DIAGNOSIS — E785 Hyperlipidemia, unspecified: Secondary | ICD-10-CM | POA: Diagnosis not present

## 2015-05-20 DIAGNOSIS — I480 Paroxysmal atrial fibrillation: Secondary | ICD-10-CM | POA: Diagnosis not present

## 2015-05-20 MED ORDER — HYDROCHLOROTHIAZIDE 12.5 MG PO CAPS: 12.5000 mg | ORAL_CAPSULE | Freq: Every day | ORAL | Status: DC

## 2015-05-20 NOTE — Assessment & Plan Note (Signed)
Doing well on her current medication regimen. No changes made Recommended if she has additional episodes of arrhythmia, she take extra half dose flecainide and diltiazem 30 mg pill

## 2015-05-20 NOTE — Patient Instructions (Signed)
You are doing well. No medication changes were made.  Please call us if you have new issues that need to be addressed before your next appt.  Your physician wants you to follow-up in: 12 months.  You will receive a reminder letter in the mail two months in advance. If you don't receive a letter, please call our office to schedule the follow-up appointment. 

## 2015-05-20 NOTE — Assessment & Plan Note (Signed)
Cholesterol is at goal on the current lipid regimen. No changes to the medications were made.  

## 2015-05-20 NOTE — Progress Notes (Signed)
Patient ID: Hailey Simmons, female    DOB: [birth year], 79 y.o.   MRN: [id number]  HPI Comments: Hailey Simmons is a very pleasant 79 year old woman, patient of Dr. Emily Filbert, with history of hypothyroidism, hyperlipidemia , paroxysmal atrial fibrillation. In January 2013 was noted  to have atrial fibrillation.  Echocardiogram showed normal ejection fraction, otherwise normal study.   Diltiazem 120 mg daily was started. She presents today for follow-up of her atrial fibrillation  flecainide increased up to 100 mg twice a day in clinic follow-up for continued atrial fibrillation  In follow up today, she reports that she is doing well. She denies any tachycardia or symptoms concerning for arrhythmia Tolerating anticoagulation. One episode of 10 minutes of lightheadedness. She wonders if this could've been atrial fibrillation. This was several months ago She is happy with his current medication regimen. Losartan increased up to 100 mg, HCTZ decreased down to 12.5 mg by primary care (for high blood pressure and low sodium)  EKG on today's visit shows normal sinus rhythm with rate 61 bpm, right bundle branch block other past medical history  Other past medical history   periodic urinary tract infections  She is typically very active, takes care of 3 grandchildren several days per week.  Previous lab work showing TSH 3.0, total cholesterol 137, LDL 60   short episode of atrial fibrillation in January and possibly April 2014, lasting less than 30 minutes. atrial fibrillation June 3 in 05/23/2014. First episode was 4 hours, second episode lasted much longer.    strong family history of coronary artery disease, father 77 mg twice a day of heart attack at age 40, mother at age 16 both from cardiac disease. Tolerating Lipitor 10 mg daily   No Known Allergies  Current Outpatient Prescriptions on File Prior to Visit  Medication Sig Dispense Refill  . apixaban (ELIQUIS) 5 MG TABS tablet Take 1 tablet  (5 mg total) by mouth 2 (two) times daily. 60 tablet 6  . atorvastatin (LIPITOR) 10 MG tablet Take 1 tablet (10 mg total) by mouth daily. 90 tablet 3  . calcium carbonate (OS-CAL) 600 MG TABS Take 600 mg by mouth 2 (two) times daily with a meal.    . Cholecalciferol (VITAMIN D3) 2000 UNITS TABS Take 1 tablet by mouth daily.    . Cranberry 1000 MG CAPS Take by mouth daily.    Marland Kitchen diltiazem (CARDIZEM) 30 MG tablet Take 1 tablet (30 mg total) by mouth 4 (four) times daily as needed. 120 tablet 6  . diltiazem (DILACOR XR) 180 MG 24 hr capsule TAKE ONE CAPSULE BY MOUTH EVERY NIGHT 90 capsule 3  . ESTRACE VAGINAL 0.1 MG/GM vaginal cream Place 1 Applicatorful vaginally as needed.     . flecainide (TAMBOCOR) 100 MG tablet Take 1 tablet (100 mg total) by mouth 2 (two) times daily. 180 tablet 3  . levothyroxine (SYNTHROID, LEVOTHROID) 100 MCG tablet Take 100 mcg by mouth daily.     No current facility-administered medications on file prior to visit.    Past Medical History  Diagnosis Date  . Hypothyroidism   . Goiter   . Fibrocystic breast disease   . Small bowel obstruction 08/2009  . PAF (paroxysmal atrial fibrillation)     a. Not on oral anticoagulant-->CHA2DS2VASc = 3;  b. On flecainide and dilt with prn propranolol.  . Asthma     a. ? 2/2 wood stove usage.  . Menopause     Past Surgical History  Procedure Laterality  Date  . Unremarkable      Social History  reports that she has never smoked. She does not have any smokeless tobacco history on file. She reports that she does not drink alcohol or use illicit drugs.  Family History family history includes Heart attack in her father; Heart failure in her mother.  Review of Systems  Constitutional: Negative.   Respiratory: Negative.   Cardiovascular: Negative.   Gastrointestinal: Negative.   Musculoskeletal: Negative.   Neurological: Negative.   Hematological: Negative.   Psychiatric/Behavioral: Negative.   All other systems reviewed  and are negative.   BP 136/62 mmHg  Pulse 61  Ht 5\' 5"  (1.651 m)  Wt 176 lb (79.833 kg)  BMI 29.29 kg/m2  Physical Exam  Constitutional: She is oriented to person, place, and time. She appears well-developed and well-nourished.  HENT:  Head: Normocephalic.  Nose: Nose normal.  Mouth/Throat: Oropharynx is clear and moist.  Eyes: Conjunctivae are normal. Pupils are equal, round, and reactive to light.  Neck: Normal range of motion. Neck supple. No JVD present.  Cardiovascular: Normal rate, regular rhythm, S1 normal, S2 normal, normal heart sounds and intact distal pulses.  Exam reveals no gallop and no friction rub.   No murmur heard. Pulmonary/Chest: Effort normal and breath sounds normal. No respiratory distress. She has no wheezes. She has no rales. She exhibits no tenderness.  Abdominal: Soft. Bowel sounds are normal. She exhibits no distension. There is no tenderness.  Musculoskeletal: Normal range of motion. She exhibits no edema or tenderness.  Lymphadenopathy:    She has no cervical adenopathy.  Neurological: She is alert and oriented to person, place, and time. Coordination normal.  Skin: Skin is warm and dry. No rash noted. No erythema.  Psychiatric: She has a normal mood and affect. Her behavior is normal. Judgment and thought content normal.    Assessment and Plan  Nursing note and vitals reviewed.

## 2015-05-23 ENCOUNTER — Other Ambulatory Visit: Payer: Self-pay | Admitting: *Deleted

## 2015-05-23 ENCOUNTER — Telehealth: Payer: Self-pay | Admitting: Cardiovascular Disease

## 2015-05-23 ENCOUNTER — Ambulatory Visit (INDEPENDENT_AMBULATORY_CARE_PROVIDER_SITE_OTHER): Payer: PPO | Admitting: *Deleted

## 2015-05-23 DIAGNOSIS — I480 Paroxysmal atrial fibrillation: Secondary | ICD-10-CM

## 2015-05-23 NOTE — Telephone Encounter (Signed)
(  Late Entry): The patient contacted me over the weekend stating she had seen Dr. Rockey Situ in the office on Friday and go a "good report." She has a history of a-fib about a year ago, but none since that time. She reports that she was in the grocery store around noon on Saturday and that she could feel her heart go out of rhythm. She got home and HR's were initially around 115. Later that afternoon, she was noticing HR's in the 30-40's and complaining of fatigue. BP was varying. I spoke with Dr. Caryl Comes to inquire about how to proceed with PM medications.Around 9:00 pm she went back in to a-fib. She took her regular 100 mg of flecainide Saturday night, but no diltiazem per Dr. Caryl Comes due to her bradycardia. She took an additional flecainide 50 mg around 11:00 pm. I spoke with the patient on Sunday morning around 8:00 am. She had developed more bradycardia through early morning hours and no energy. SBP was still in the 150's. Per Dr. Caryl Comes, the patient was brought in to the office for an EKG. Report sent to Dr. Caryl Comes for review. The patient was in a junctional rhythm- HR ~ 41 bpm around 10:30 am Sunday morning. BP in the office was 160/60. Dr. Caryl Comes did speak with the patient via phone and advised no flecainide or diltiazem on Sunday (no AM meds had initially been taken prior to the EKG). HR's should increase with flecainide clearing her system. If no better by Monday and rates are are still slow, may need evaluation for possible PPM.   I spoke with the patient this earlier today and she was in NSR from about 1:30 pm yesterday until this morning and she went back in to a-fib with rates ranging from 90-141 bpm. About 2:00 pm she stated she went back in rhythm. Reviewed with Dr. Caryl Comes- continue current medications today and follow up with him in the [facility] office tomorrow at 8:00 am. I have a left a message for the patient with Dr. Olin Pia recommendations.   Will forward to Dr. Rockey Situ as an Juluis Rainier.

## 2015-05-23 NOTE — Progress Notes (Signed)
Expand All Collapse All   (Late Entry): The patient contacted me over the weekend stating she had seen Dr. Rockey Situ in the office on Friday and go a "good report." She has a history of a-fib about a year ago, but none since that time. She reports that she was in the grocery store around noon on Saturday and that she could feel her heart go out of rhythm. She got home and HR's were initially around 115. Later that afternoon, she was noticing HR's in the 30-40's and complaining of fatigue. BP was varying. I spoke with Dr. Caryl Comes to inquire about how to proceed with PM medications.Around 9:00 pm she went back in to a-fib. She took her regular 100 mg of flecainide Saturday night, but no diltiazem per Dr. Caryl Comes due to her bradycardia. She took an additional flecainide 50 mg around 11:00 pm. I spoke with the patient on Sunday morning around 8:00 am. She had developed more bradycardia through early morning hours and no energy. SBP was still in the 150's. Per Dr. Caryl Comes, the patient was brought in to the office for an EKG. Report sent to Dr. Caryl Comes for review. The patient was in a junctional rhythm- HR ~ 41 bpm around 10:30 am Sunday morning. BP in the office was 160/60. Dr. Caryl Comes did speak with the patient via phone and advised no flecainide or diltiazem on Sunday (no AM meds had initially been taken prior to the EKG). HR's should increase with flecainide clearing her system. If no better by Monday and rates are are still slow, may need evaluation for possible PPM.   I spoke with the patient this earlier today and she was in NSR from about 1:30 pm yesterday until this morning and she went back in to a-fib with rates ranging from 90-141 bpm. About 2:00 pm she stated she went back in rhythm. Reviewed with Dr. Caryl Comes- continue current medications today and follow up with him in the [facility] office tomorrow at 8:00 am. I have a left a message for the patient with Dr. Olin Pia recommendations.   Will forward to Dr. Rockey Situ as  an Juluis Rainier.

## 2015-05-24 ENCOUNTER — Ambulatory Visit (INDEPENDENT_AMBULATORY_CARE_PROVIDER_SITE_OTHER): Payer: PPO | Admitting: Internal Medicine

## 2015-05-24 ENCOUNTER — Encounter: Payer: Self-pay | Admitting: Internal Medicine

## 2015-05-24 ENCOUNTER — Telehealth: Payer: Self-pay

## 2015-05-24 VITALS — BP 140/60 | HR 67 | Ht 65.0 in | Wt 174.8 lb

## 2015-05-24 DIAGNOSIS — I4891 Unspecified atrial fibrillation: Secondary | ICD-10-CM

## 2015-05-24 NOTE — Patient Instructions (Signed)
Medication Instructions:  Your physician recommends that you continue on your current medications as directed. Please refer to the Current Medication list given to you today.   Labwork: none  Testing/Procedures: Please call our office when you decide if you want a pacemaker.    Follow-Up: Your physician recommends that you schedule a follow-up appointment as needed with Dr. Caryl Comes.    Any Other Special Instructions Will Be Listed Below (If Applicable).  Pacemaker Implantation The heart has its own electrical system, or natural pacemaker, to regulate the heartbeat. Sometimes, the natural pacemaker system of the heart fails and causes the heart to beat too slowly. If this happens, a pacemaker can be surgically placed to help the heart beat at a normal or programmed rate. A pacemaker is a small, battery-powered device that is placed under the skin and is programmed to sense your heartbeats. If your heart rate is lower than the programmed rate, the pacemaker will pace your heart. Parts of a pacemaker include:  Wires or leads. The leads are placed in the heart and transmit electricity to the heart. The leads are connected to the pulse generator.  Pulse generator. The pulse generator contains a computer and a memory system. The pulse generator also produces the electrical signal that triggers the heart to beat. A pacemaker may be placed if:  You have a slow heartbeat (bradycardia).  You have fainting (syncope).  Shortness of breath (dyspnea) due to heart problems. LET [facility] CARE PROVIDER KNOW ABOUT:  Any allergies you may have.  All medicines you are taking, including vitamins, herbs, eye drops, creams, and over-the-counter medicines.  Previous problems you or members of your family have had with the use of anesthetics.  Any blood disorders you have.  Previous surgeries you have had.  Medical conditions you have.  Possibility of pregnancy, if this applies. RISKS AND  COMPLICATIONS Generally, pacemaker implantation is a safe procedure. However, problems can occur and include:  Bleeding.  Unable to place the pacemaker under local sedation.  Infection. BEFORE THE PROCEDURE  You will have blood work drawn before the procedure.  Do not use any tobacco products including cigarettes, chewing tobacco, or electronic cigarettes. If you need help quitting, ask your health care provider.  Do not eat or drink anything after midnight on the night before the procedure or as directed by your health care provider.  Ask your health care provider about:  Changing or stopping your regular medicines. This is especially important if you are taking diabetes medicines or blood thinners.  Taking medicines such as aspirin and ibuprofen. These medicines can thin your blood. Do not take these medicines before your procedure if your health care provider asks you not to.  Ask your health care provider if you can take a sip of water with any approved medicines the morning of the procedure. PROCEDURE  The surgery to place a pacemaker is considered a minimally invasive surgical procedure. It is done under a local anesthetic, which is an injection at the incision site that makes the skin numb. You are also given sedation and pain medicine that makes you drowsy during the procedure.   An intravenous line (IV) will be started in your hand or arm so sedation and pain medicine can be given during the pacemaker procedure.  A numbing medicine will be injected into the skin where the pacemaker is to be placed. A small incision will then be made into the skin. The pacemaker is usually placed under the skin near  the collarbone.  After the incision has been made, the leads will be inserted into a large vein and guided into the heart using X-ray.  Using the same incision that was used to place the leads, a small pocket will be created under the skin to hold the pulse generator. The leads  will then be connected to the pulse generator.  The incision site will then be closed. A bandage (dressing) is placed over the pacemaker site. The dressing is removed 24-48 hours afterward. AFTER THE PROCEDURE  You will be taken to a recovery area after the pacemaker implant. Your vital signs such as blood pressure, heart rate, breathing, and oxygen levels will be monitored.  A chest X-ray will be done after the pacemaker has been implanted. This is to make sure the pacemaker and leads are in the correct place. Document Released: 11/09/2002 Document Revised: 04/05/2014 Document Reviewed: 03/25/2012 [facility] Patient Information 2015 [place], [place]. This information is not intended to replace advice given to you by your health care provider. Make sure you discuss any questions you have with your health care provider. Pacemaker Implantation, Care After Refer to this sheet over the next few weeks. These instructions provide you with information on caring for yourself after the procedure. Your health care provider may also give you more specific instructions. Your treatment has been planned according to current medical practices, but problems sometimes occur. Call your health care provider if you have any problems or questions regarding your pacemaker.  WHAT TO EXPECT AFTER THE PROCEDURE  You may feel pain. Some pain is normal. It may last a few days.  A slight bump may be seen over the skin where the device was placed. Sometimes, it is possible to feel the device under the skin. This is normal.  In the months and years afterward, your health care provider will check the device, the leads, and the battery every few months. Eventually, when the battery is low, the device will be replaced. HOME CARE INSTRUCTIONS Medicines  Take medicines only as directed by your health care provider.  If you were prescribed an antibiotic medicine, finish it all even if you start to feel better.  Do not take any  other medicines without asking your health care provider first. Some medicines, including certain painkillers, can cause bleeding in your stomach after surgery. Wound Care  Do not remove the bandage on your chest until directed to do so by your health care provider.  After your bandage is removed, you may see pieces of tape called skin adhesive strips over the area where the cut was made (incision site). Let them fall off on their own.  Check the incision site every day to make sure it is not infected, bleeding, or starting to pull apart.  Do not use lotions or ointments near the incision site unless directed to do so.  Keep the incision area clean and dry for 2-3 days after the procedure or as directed by your health care provider. It takes several weeks for the incision site to completely heal.  Do not take baths, swim, or use a hot tub until your health care provider approves. Activities  Try to walk a little every day. Exercising is important after this procedure. It is also important to use your shoulder on the side of the pacemaker in daily tasks that do not require exaggerated motion.  Avoid sudden jerking, pulling, or chopping movements that pull your upper arm far away from your body for at least  6 weeks.  Do not lift your upper arm above your shoulders for at least 6 weeks. This means no tennis, golf, or swimming for this period of time. If you sleep with the arm above your head, use a restraint to prevent this from happening as you sleep.  You may go back to work when your health care provider says it is okay. Check with your health care provider before you start to drive or play sports. Other Instructions  Follow diet instructions if they were provided. You should be able to eat what you usually do right away, but you may need to limit your salt intake.  Weigh yourself every day. If you suddenly gain weight, fluid may be building up in your body.  Always carry your pacemaker  identification card with you. The card should list the implant date, device model, and manufacturer. Consider wearing a medical alert bracelet or necklace.  Tell all health care providers that you have a pacemaker. This may prevent them from giving you a magnetic resource imaging scan (MRI) because of the strong magnets used during that test.  If you must pass through a metal detector, quickly walk through it. Do not stop under the detector or stand near it.  Avoid places or objects with a strong electric or magnetic field, including:  Engineer, maintenance. When at the airport, let officials know you have a pacemaker. Your ID card will let you be checked in a way that is safe for you and that will not damage your pacemaker. Also, do not let a security person wave a magnetic wand near your pacemaker. That can make it stop working.  Power plants.  Large electrical generators.  Radiofrequency transmission towers, such as cell phone and radio towers.  Do not use amateur (ham) radio equipment or electric (arc) welding torches. Some devices are safe to use if held at least 1 foot from your pacemaker. These include power tools, lawn mowers, and speakers. If you are unsure of whether something is safe to use, ask your health care provider.  You may safely use electric blankets, heating pads, computers, and microwave ovens.  When using your cell phone, hold it to the ear opposite the pacemaker. Do not leave your cell phone in a pocket over the pacemaker.  Keep all follow-up visits as directed by your health care provider. This is how your health care provider makes sure your chest is healing the way it should. Ask your health care provider when you should come back to have your stitches or staples taken out.  Have your pacemaker checked every 3-6 months or as directed by your health care provider. Most pacemakers last for 4-8 years before a new one is needed. SEEK MEDICAL CARE IF:  You gain  weight suddenly.  Your legs or feet swell more than they have before.  It feels like your heart is fluttering or skipping beats (heart palpitations).  You have a fever. SEEK IMMEDIATE MEDICAL CARE IF:  You have chest pain.  You feel more short of breath than you have felt before.  You feel more light-headed than you have felt before.  You have problems with your incision site, such as swelling or bleeding, or it starts to open up.  You have drainage, redness, swelling, or pain at your incision site. Document Released: 06/08/2005 Document Revised: 04/05/2014 Document Reviewed: 03/21/2012 [facility] Patient Information 2015 [place], [place]. This information is not intended to replace advice given to you by your health care  provider. Make sure you discuss any questions you have with your health care provider.

## 2015-05-24 NOTE — Progress Notes (Signed)
ELECTROPHYSIOLOGY CONSULT NOTE  Patient ID: Hailey Simmons, MRN: [id number], DOB/AGE: Oct 06, 1936 79 y.o. Admit date: (Not on file) Date of Consult: 05/24/2015  Primary Physician: Rusty Aus., MD Primary Cardiologist: TG   Chief Complaint: atrial fibillatrion   HPI Hailey Simmons is a 79 y.o. female  With a history of atrial fibrillation dating back to at least 2013. She has been treated with a combination of diltiazem and flecainide. She has underlying right bundle branch block.  She was seen a year ago when, because she had had an episode of atrial fibrillation she had taken extra Cardizem. She noted her heart rate was in the 30s. She saw CB. It was his impression that the extra calcium blocker had likely caused bradycardia. She's had no intercurrent atrial fibrillation.  This wrap last weekend she awakened on Saturday with a pulse in the 30s. She is feeling weak and terrible. There was increase exercise intolerance. She took none of her medications. Sunday morning she continued with bradycardia. Later Sunday she developed atrial fibrillation with a rapid rate and took flecainide and Cardizem again. Prior to that, her heart rate had been noted to be back in the 70s.  She has modest exercise intolerance. Treadmill testing a year ago had demonstrated no ischemia significant blood pressure response exercise (220) and poor exercise.  Echo 2/13  EF normal LA normal  She also has sleep disordered breathing and significant daytime somnolence   Past Medical History  Diagnosis Date  . Hypothyroidism   . Goiter   . Fibrocystic breast disease   . Small bowel obstruction 08/2009  . PAF (paroxysmal atrial fibrillation)     a. Not on oral anticoagulant-->[id number] = 3;  b. On flecainide and dilt with prn propranolol.  . Asthma     a. ? 2/2 wood stove usage.  . Menopause       Surgical History:  Past Surgical History  Procedure Laterality Date  . Unremarkable       Home  Meds: Prior to Admission medications   Medication Sig Start Date End Date Taking? Authorizing Provider  apixaban (ELIQUIS) 5 MG TABS tablet Take 1 tablet (5 mg total) by mouth 2 (two) times daily. 11/19/14  Yes [name], MD  atorvastatin (LIPITOR) 10 MG tablet Take 1 tablet (10 mg total) by mouth daily. 11/19/14 03/09/16 Yes [name], MD  calcium carbonate (OS-CAL) 600 MG TABS Take 600 mg by mouth 2 (two) times daily with a meal.   Yes Historical Provider, MD  Cholecalciferol (VITAMIN D3) 2000 UNITS TABS Take 1 tablet by mouth daily.   Yes Historical Provider, MD  Cranberry 1000 MG CAPS Take by mouth daily.   Yes Historical Provider, MD  diltiazem (CARDIZEM) 30 MG tablet Take 1 tablet (30 mg total) by mouth 4 (four) times daily as needed. 10/21/13  Yes [name], MD  diltiazem (DILACOR XR) 180 MG 24 hr capsule TAKE ONE CAPSULE BY MOUTH EVERY NIGHT 01/13/15  Yes [name], MD  ESTRACE VAGINAL 0.1 MG/GM vaginal cream Place 1 Applicatorful vaginally as needed.  05/05/14  Yes Historical Provider, MD  flecainide (TAMBOCOR) 100 MG tablet Take 1 tablet (100 mg total) by mouth 2 (two) times daily. 11/19/14  Yes [name], MD  hydrochlorothiazide (MICROZIDE) 12.5 MG capsule Take 1 capsule (12.5 mg total) by mouth daily. 05/20/15  Yes [name], MD  levothyroxine (SYNTHROID, LEVOTHROID) 100 MCG tablet Take 100 mcg by mouth daily.  Yes Historical Provider, MD  losartan (COZAAR) 100 MG tablet Take 100 mg by mouth daily.   Yes Historical Provider, MD       Allergies: No Known Allergies  History   Social History  . Marital Status: Married    Spouse Name: N/A  . Number of Children: 3  . Years of Education: N/A   Occupational History  . PAYROLL CLERK     [place]   Social History Main Topics  . Smoking status: Never Smoker   . Smokeless tobacco: Not on file  . Alcohol Use: No  . Drug Use: No  . Sexual Activity: Not on file   Other Topics  Concern  . Not on file   Social History Narrative     Family History  Problem Relation Age of Onset  . Heart failure Mother   . Heart attack Father      ROS:  Please see the history of present illness.     All other systems reviewed and negative.    Physical Exam:   Height 5\' 5"  (1.651 m), weight 174 lb 12 oz (79.266 kg). General: Well developed, well nourished female in no acute distress. Head: Normocephalic, atraumatic, sclera non-icteric, no xanthomas, nares are without discharge. EENT: normal Lymph Nodes:  none Back: without scoliosis/kyphosis , no CVA tendersness Neck: Negative for carotid bruits. JVD not elevated. Lungs: Clear bilaterally to auscultation without wheezes, rales, or rhonchi. Breathing is unlabored. Heart: RRR with S1 S2 2/6 systolic murmur , rubs, or gallops appreciated. Abdomen: Soft, non-tender, non-distended with normoactive bowel sounds. No hepatomegaly. No rebound/guarding. No obvious abdominal masses. Msk:  Strength and tone appear normal for age. Extremities: No clubbing or cyanosis. No   edema.  Distal pedal pulses are 2+ and equal bilaterally. Skin: Warm and Dry Neuro: Alert and oriented X 3. CN III-XII intact Grossly normal sensory and motor function . Psych:  Responds to questions appropriately with a normal affect.      Labs: Cardiac Enzymes No results for input(s): CKTOTAL, CKMB, TROPONINI in the last 72 hours. CBC Lab Results  Component Value Date   WBC 6.5 06/25/2014   HGB 12.4 06/25/2014   HCT 37.9 06/25/2014   MCV 84 06/25/2014   PLT CANCELED 06/25/2014   PROTIME: No results for input(s): LABPROT, INR in the last 72 hours. Chemistry No results for input(s): NA, K, CL, CO2, BUN, CREATININE, CALCIUM, PROT, BILITOT, ALKPHOS, ALT, AST, GLUCOSE in the last 168 hours.  Invalid input(s): LABALBU Lipids No results found for: CHOL, HDL, LDLCALC, TRIG BNP No results found for: PROBNP Thyroid Function Tests: No results for input(s):  TSH, T4TOTAL, T3FREE, THYROIDAB in the last 72 hours.  Invalid input(s): FREET3    Miscellaneous No results found for: DDIMER  Radiology/Studies:  No results found.  EKG:  Sinus rhythm with right bundle branch block   Assessment and Plan:   Atrial fibrillation Paroxysmal  Hypertension  Sinus node dysfunction    She has symptomatic recurrent atrial fibrillation but more prominently symptomatic sinus node dysfunction occurring even in the washed out. I'll drugs. Hence, I think pacing for symptomatic bradycardia is appropriate   An alternative strategy would be to discontinue the flecainide and a calcium blocker and attempt to use dofetilide; however, given the fact that there was recurrent sinus arrest or than 36 hours following discontinuation of her drugs I suspect that this would be unsatisfying as the sinus node issue would emerge even on dofetilide therapy.  The benefits and  risks were reviewed including but not limited to death,  perforation, infection, lead dislodgement and device malfunction.  The patient understands agrees and is willing to proceed.  She also has symptoms consistent with sleep apnea. Her blood pressure daytime somnolence and atrial fibrillation meal be mitigated by addressing her   I will defer this to Dr. Sabra Heck 2  Virl Axe

## 2015-05-24 NOTE — Telephone Encounter (Signed)
Pt left office today before getting paperwork. Left message for patient and stated that we put her paperwork in the mail with a callback number if she needs to contact us.

## 2015-05-26 ENCOUNTER — Telehealth: Payer: Self-pay | Admitting: *Deleted

## 2015-05-26 DIAGNOSIS — I480 Paroxysmal atrial fibrillation: Secondary | ICD-10-CM

## 2015-05-26 NOTE — Telephone Encounter (Signed)
Patient has a question regarding a pacemaker.  Wants to know if Dr. Rockey Situ agrees with Dr. Caryl Comes  that she needs a pacemaker. Please call patient. Thanks

## 2015-05-26 NOTE — Telephone Encounter (Signed)
Problem is without medications, she will have rapid heart rate, more atrial fibrillation With the medications she is having significant arrhythmia, junctional rhythm in the 30s This is called sick sinus syndrome It cannot make up his mind whether to go fast or slow Typically these episodes are treated with a pacemaker Changing to alternate electrical pills can cause the slow rhythm to come back again I would probably agree with a pacemaker Than any medication can be used, would be able to block out any further atrial fibrillation one would hope

## 2015-05-27 ENCOUNTER — Encounter: Payer: Self-pay | Admitting: *Deleted

## 2015-05-27 NOTE — Telephone Encounter (Signed)
I spoke with the patient today. She is ready to schedule her PPM implant. She would like to do this on 06/22/15. Will schedule and notify the patient of her instructions.

## 2015-05-27 NOTE — Telephone Encounter (Signed)
S/w pt regarding Dr. Donivan Scull recommendations. Pt states she appreciates his response and felt Dr. Caryl Comes was right but wanted Dr. Donivan Scull opinion since she normally sees him. Pt verbalized understanding with no further questions.

## 2015-05-30 ENCOUNTER — Telehealth: Payer: Self-pay | Admitting: Internal Medicine

## 2015-05-30 NOTE — Telephone Encounter (Signed)
I spoke with the patient. She reports having another episode of slow heart rates yesterday from late morning until some time after 1:00 am this morning. She was having weakness. HR when she went to bed last night was 35 bpm. She has felt ok today. HR when she woke this morning was 61 bpm. Reviewed with Dr. Caryl Comes- decrease flecainide to 50 mg BID and take diltiazem PRN for fast heart rates. PPM implant scheduled for 06/22/15. Will look at the schedule with Dr. Caryl Comes tomorrow to try to schedule earlier due to recurrent symptoms. The patient is aware of the above and agreeable.

## 2015-05-31 NOTE — Telephone Encounter (Signed)
Reviewed the patient's symptoms with Dr. Caryl Comes. Schedule has not allowed for the PPM implant to be moved up at this time. The patient has plans to be out of town on 06/09/15, which is Dr. Olin Pia next procedure date scheduled. Discussed with Dr. Caryl Comes if he felt the patient would be ok to go out of town since medications have been cut back some. He agreed this should be ok. I advised the patient of the above. She is feeling well today. We will plan to proceed with PPM implant on 06/22/15 as scheduled.

## 2015-06-15 ENCOUNTER — Other Ambulatory Visit (INDEPENDENT_AMBULATORY_CARE_PROVIDER_SITE_OTHER): Payer: PPO

## 2015-06-15 DIAGNOSIS — I480 Paroxysmal atrial fibrillation: Secondary | ICD-10-CM | POA: Diagnosis not present

## 2015-06-16 LAB — BASIC METABOLIC PANEL
BUN/Creatinine Ratio: 16 (ref 11–26)
BUN: 13 mg/dL (ref 8–27)
CO2: 20 mmol/L (ref 18–29)
CREATININE: 0.82 mg/dL (ref 0.57–1.00)
Calcium: 9.1 mg/dL (ref 8.7–10.3)
Chloride: 94 mmol/L — ABNORMAL LOW (ref 97–108)
GFR calc Af Amer: 80 mL/min/{1.73_m2} (ref >59)
GFR, EST NON AFRICAN AMERICAN: 70 mL/min/{1.73_m2} (ref >59)
Glucose: 119 mg/dL — ABNORMAL HIGH (ref 65–99)
POTASSIUM: 5.1 mmol/L (ref 3.5–5.2)
Sodium: 133 mmol/L — ABNORMAL LOW (ref 134–144)

## 2015-06-16 LAB — CBC WITH DIFFERENTIAL/PLATELET
BASOS: 0 %
Basophils Absolute: 0 10*3/uL (ref 0.0–0.2)
EOS (ABSOLUTE): 0.2 10*3/uL (ref 0.0–0.4)
Eos: 3 %
Hematocrit: 36.9 % (ref 34.0–46.6)
Hemoglobin: 12.1 g/dL (ref 11.1–15.9)
Immature Grans (Abs): 0 10*3/uL (ref 0.0–0.1)
Immature Granulocytes: 0 %
Lymphocytes Absolute: 0.9 10*3/uL (ref 0.7–3.1)
Lymphs: 17 %
MCH: 27.8 pg (ref 26.6–33.0)
MCHC: 32.8 g/dL (ref 31.5–35.7)
MCV: 85 fL (ref 79–97)
MONOS ABS: 0.4 10*3/uL (ref 0.1–0.9)
Monocytes: 8 %
NEUTROS ABS: 3.9 10*3/uL (ref 1.4–7.0)
NEUTROS PCT: 72 %
Platelets: 123 10*3/uL — ABNORMAL LOW (ref 150–379)
RBC: 4.36 x10E6/uL (ref 3.77–5.28)
RDW: 13.3 % (ref 12.3–15.4)
WBC: 5.4 10*3/uL (ref 3.4–10.8)

## 2015-06-20 ENCOUNTER — Encounter (HOSPITAL_COMMUNITY): Payer: Self-pay | Admitting: Pharmacy Technician

## 2015-06-22 ENCOUNTER — Ambulatory Visit (HOSPITAL_COMMUNITY): Payer: PPO

## 2015-06-22 ENCOUNTER — Encounter (HOSPITAL_COMMUNITY): Payer: Self-pay | Admitting: Internal Medicine

## 2015-06-22 ENCOUNTER — Encounter (HOSPITAL_COMMUNITY)
Admission: RE | Disposition: A | Discharge status: Home / Self Care | Payer: PPO | Source: Ambulatory Visit | Attending: Internal Medicine

## 2015-06-22 ENCOUNTER — Ambulatory Visit (HOSPITAL_COMMUNITY)
Admission: RE | Admit: 2015-06-22 | Discharge: 2015-06-23 | Disposition: A | Discharge status: Home / Self Care | Payer: PPO | Source: Ambulatory Visit | Attending: Internal Medicine | Admitting: Internal Medicine

## 2015-06-22 DIAGNOSIS — I4510 Unspecified right bundle-branch block: Secondary | ICD-10-CM | POA: Insufficient documentation

## 2015-06-22 DIAGNOSIS — Z959 Presence of cardiac and vascular implant and graft, unspecified: Secondary | ICD-10-CM

## 2015-06-22 DIAGNOSIS — I480 Paroxysmal atrial fibrillation: Secondary | ICD-10-CM | POA: Diagnosis present

## 2015-06-22 DIAGNOSIS — I495 Sick sinus syndrome: Secondary | ICD-10-CM | POA: Diagnosis not present

## 2015-06-22 DIAGNOSIS — I10 Essential (primary) hypertension: Secondary | ICD-10-CM | POA: Insufficient documentation

## 2015-06-22 DIAGNOSIS — E039 Hypothyroidism, unspecified: Secondary | ICD-10-CM | POA: Diagnosis not present

## 2015-06-22 DIAGNOSIS — G4730 Sleep apnea, unspecified: Secondary | ICD-10-CM | POA: Insufficient documentation

## 2015-06-22 HISTORY — DX: Basal cell carcinoma of skin of nose: C44.311

## 2015-06-22 HISTORY — DX: Essential (primary) hypertension: I10

## 2015-06-22 HISTORY — PX: EP IMPLANTABLE DEVICE: SHX172B

## 2015-06-22 LAB — SURGICAL PCR SCREEN
MRSA, PCR: NEGATIVE
STAPHYLOCOCCUS AUREUS: NEGATIVE

## 2015-06-22 SURGERY — PACEMAKER IMPLANT
Anesthesia: LOCAL

## 2015-06-22 MED ORDER — HYDROCHLOROTHIAZIDE 12.5 MG PO CAPS
12.5000 mg | ORAL_CAPSULE | Freq: Every day | ORAL | Status: DC
  Administered 2015-06-22: 12.5 mg via ORAL
  Filled 2015-06-22 (×2): qty 1

## 2015-06-22 MED ORDER — ACETAMINOPHEN 325 MG PO TABS: 325.0000 mg | ORAL_TABLET | ORAL | Status: DC | PRN

## 2015-06-22 MED ORDER — LOSARTAN POTASSIUM 50 MG PO TABS
100.0000 mg | ORAL_TABLET | Freq: Every day | ORAL | Status: DC
  Administered 2015-06-22 – 2015-06-23 (×2): 100 mg via ORAL
  Filled 2015-06-22 (×2): qty 2

## 2015-06-22 MED ORDER — LIDOCAINE HCL (PF) 1 % IJ SOLN
INTRAMUSCULAR | Status: AC
  Filled 2015-06-22: qty 60

## 2015-06-22 MED ORDER — ONDANSETRON HCL 4 MG/2ML IJ SOLN: 4.0000 mg | Freq: Four times a day (QID) | INTRAMUSCULAR | Status: DC | PRN

## 2015-06-22 MED ORDER — CEFAZOLIN SODIUM-DEXTROSE 2-3 GM-% IV SOLR: 2.0000 g | INTRAVENOUS | Status: DC

## 2015-06-22 MED ORDER — FENTANYL CITRATE (PF) 100 MCG/2ML IJ SOLN
INTRAMUSCULAR | Status: AC
  Filled 2015-06-22: qty 2

## 2015-06-22 MED ORDER — FENTANYL CITRATE (PF) 100 MCG/2ML IJ SOLN
INTRAMUSCULAR | Status: DC | PRN
  Administered 2015-06-22 (×3): 25 ug via INTRAVENOUS

## 2015-06-22 MED ORDER — SODIUM CHLORIDE 0.9 % IR SOLN
Status: DC | PRN
  Administered 2015-06-22: 09:00:00

## 2015-06-22 MED ORDER — DILTIAZEM HCL ER 180 MG PO CP24
180.0000 mg | ORAL_CAPSULE | Freq: Every day | ORAL | Status: DC
  Filled 2015-06-22 (×2): qty 1

## 2015-06-22 MED ORDER — APIXABAN 5 MG PO TABS: 5.0000 mg | ORAL_TABLET | Freq: Two times a day (BID) | ORAL | Status: DC

## 2015-06-22 MED ORDER — MIDAZOLAM HCL 5 MG/5ML IJ SOLN
INTRAMUSCULAR | Status: DC | PRN
  Administered 2015-06-22 (×2): 1 mg via INTRAVENOUS
  Administered 2015-06-22: 2 mg via INTRAVENOUS
  Administered 2015-06-22 (×2): 1 mg via INTRAVENOUS

## 2015-06-22 MED ORDER — SODIUM CHLORIDE 0.9 % IV SOLN
INTRAVENOUS | Status: DC
  Administered 2015-06-22: 07:00:00 via INTRAVENOUS

## 2015-06-22 MED ORDER — MUPIROCIN 2 % EX OINT
TOPICAL_OINTMENT | CUTANEOUS | Status: AC
  Filled 2015-06-22: qty 22

## 2015-06-22 MED ORDER — CEFAZOLIN SODIUM-DEXTROSE 2-3 GM-% IV SOLR
INTRAVENOUS | Status: AC
  Filled 2015-06-22: qty 50

## 2015-06-22 MED ORDER — GENTAMICIN SULFATE 40 MG/ML IJ SOLN
80.0000 mg | INTRAMUSCULAR | Status: DC
  Filled 2015-06-22: qty 2

## 2015-06-22 MED ORDER — VERAPAMIL HCL 2.5 MG/ML IV SOLN
INTRAVENOUS | Status: AC
  Filled 2015-06-22: qty 2

## 2015-06-22 MED ORDER — LIDOCAINE HCL (PF) 1 % IJ SOLN
INTRAMUSCULAR | Status: DC | PRN
  Administered 2015-06-22: 40 mL

## 2015-06-22 MED ORDER — SODIUM CHLORIDE 0.9 % IV SOLN: INTRAVENOUS | Status: AC

## 2015-06-22 MED ORDER — MUPIROCIN 2 % EX OINT
1.0000 "application " | TOPICAL_OINTMENT | Freq: Once | CUTANEOUS | Status: AC
  Administered 2015-06-22: 1 via TOPICAL
  Filled 2015-06-22: qty 22

## 2015-06-22 MED ORDER — LEVOTHYROXINE SODIUM 100 MCG PO TABS
100.0000 ug | ORAL_TABLET | Freq: Every day | ORAL | Status: DC
  Administered 2015-06-23: 100 ug via ORAL
  Filled 2015-06-22: qty 1

## 2015-06-22 MED ORDER — MIDAZOLAM HCL 5 MG/5ML IJ SOLN
INTRAMUSCULAR | Status: AC
  Filled 2015-06-22: qty 5

## 2015-06-22 MED ORDER — CALCIUM CARBONATE 600 MG PO TABS
600.0000 mg | ORAL_TABLET | Freq: Two times a day (BID) | ORAL | Status: DC
  Filled 2015-06-22 (×3): qty 1

## 2015-06-22 MED ORDER — HEPARIN (PORCINE) IN NACL 2-0.9 UNIT/ML-% IJ SOLN
INTRAMUSCULAR | Status: AC
  Filled 2015-06-22: qty 500

## 2015-06-22 MED ORDER — ATORVASTATIN CALCIUM 10 MG PO TABS
10.0000 mg | ORAL_TABLET | Freq: Every day | ORAL | Status: DC
  Administered 2015-06-22: 14:00:00 10 mg via ORAL
  Filled 2015-06-22 (×2): qty 1

## 2015-06-22 MED ORDER — SODIUM CHLORIDE 0.9 % IR SOLN
Status: AC
  Filled 2015-06-22: qty 2

## 2015-06-22 MED ORDER — CALCIUM CARBONATE 1250 (500 CA) MG PO TABS
1.0000 | ORAL_TABLET | Freq: Two times a day (BID) | ORAL | Status: DC
  Filled 2015-06-22 (×3): qty 1

## 2015-06-22 MED ORDER — CEFAZOLIN SODIUM-DEXTROSE 2-3 GM-% IV SOLR
INTRAVENOUS | Status: DC | PRN
  Administered 2015-06-22: 2 g via INTRAVENOUS

## 2015-06-22 MED ORDER — CHLORHEXIDINE GLUCONATE 4 % EX LIQD
60.0000 mL | Freq: Once | CUTANEOUS | Status: DC
  Filled 2015-06-22: qty 60

## 2015-06-22 MED ORDER — CEFAZOLIN SODIUM 1-5 GM-% IV SOLN
1.0000 g | Freq: Four times a day (QID) | INTRAVENOUS | Status: AC
  Administered 2015-06-22 – 2015-06-23 (×3): 1 g via INTRAVENOUS
  Filled 2015-06-22 (×3): qty 50

## 2015-06-22 MED ORDER — FLECAINIDE ACETATE 50 MG PO TABS
50.0000 mg | ORAL_TABLET | Freq: Two times a day (BID) | ORAL | Status: DC
  Administered 2015-06-22 – 2015-06-23 (×3): 50 mg via ORAL
  Filled 2015-06-22 (×5): qty 1

## 2015-06-22 SURGICAL SUPPLY — 7 items
CABLE SURGICAL S-101-97-12 (CABLE) ×2
LEAD CAPSURE NOVUS 5076-52CM (Lead) ×2 IMPLANT
LEAD CAPSURE NOVUS 5076-58CM (Lead) ×2 IMPLANT
PAD DEFIB LIFELINK (PAD) ×2
PPM ADVISA MRI DR A2DR01 (Pacemaker) ×2 IMPLANT
SHEATH CLASSIC 7F (SHEATH) ×4
TRAY PACEMAKER INSERTION (CUSTOM PROCEDURE TRAY) ×2

## 2015-06-22 NOTE — Discharge Instructions (Signed)
° ° °  Supplemental Discharge Instructions for  Pacemaker/Defibrillator Patients  Activity No heavy lifting or vigorous activity with your left/right arm for 6 to 8 weeks.  Do not raise your left/right arm above your head for one week.  Gradually raise your affected arm as drawn below.           __      06/26/15                 06/27/15                      06/28/15                    06/29/15  NO DRIVING for  1 week   ; you may begin driving on  1/66/06   .  WOUND CARE - Keep the wound area clean and dry.  Do not get this area wet for one week. No showers for one week; you may shower on 06/29/15    . - The tape/steri-strips on your wound will fall off; do not pull them off.  No bandage is needed on the site.  DO  NOT apply any creams, oils, or ointments to the wound area. - If you notice any drainage or discharge from the wound, any swelling or bruising at the site, or you develop a fever > 101? F after you are discharged home, call the office at once.  Special Instructions - You are still able to use cellular telephones; use the ear opposite the side where you have your pacemaker/defibrillator.  Avoid carrying your cellular phone near your device. - When traveling through airports, show security personnel your identification card to avoid being screened in the metal detectors.  Ask the security personnel to use the hand wand. - Avoid arc welding equipment, MRI testing (magnetic resonance imaging), TENS units (transcutaneous nerve stimulators).  Call the office for questions about other devices. - Avoid electrical appliances that are in poor condition or are not properly grounded. - Microwave ovens are safe to be near or to operate.

## 2015-06-22 NOTE — Progress Notes (Signed)
Doing well post PPM implant No ptx on CXR Will interrogate device remote tomorrow morning.   Plan to DC home this afternoon.  Chanetta Marshall, NP 06/22/2015 4:47 PM

## 2015-06-22 NOTE — H&P (View-Only) (Signed)
ELECTROPHYSIOLOGY CONSULT NOTE  Patient ID: Hailey Simmons, MRN: [id number], DOB/AGE: March 16, 1936 79 y.o. Admit date: (Not on file) Date of Consult: 05/24/2015  Primary Physician: Rusty Aus., MD Primary Cardiologist: TG   Chief Complaint: atrial fibillatrion   HPI Hailey Simmons is a 79 y.o. female  With a history of atrial fibrillation dating back to at least 2013. She has been treated with a combination of diltiazem and flecainide. She has underlying right bundle branch block.  She was seen a year ago when, because she had had an episode of atrial fibrillation she had taken extra Cardizem. She noted her heart rate was in the 30s. She saw CB. It was his impression that the extra calcium blocker had likely caused bradycardia. She's had no intercurrent atrial fibrillation.  This wrap last weekend she awakened on Saturday with a pulse in the 30s. She is feeling weak and terrible. There was increase exercise intolerance. She took none of her medications. Sunday morning she continued with bradycardia. Later Sunday she developed atrial fibrillation with a rapid rate and took flecainide and Cardizem again. Prior to that, her heart rate had been noted to be back in the 70s.  She has modest exercise intolerance. Treadmill testing a year ago had demonstrated no ischemia significant blood pressure response exercise (220) and poor exercise.  Echo 2/13  EF normal LA normal  She also has sleep disordered breathing and significant daytime somnolence   Past Medical History  Diagnosis Date  . Hypothyroidism   . Goiter   . Fibrocystic breast disease   . Small bowel obstruction 08/2009  . PAF (paroxysmal atrial fibrillation)     a. Not on oral anticoagulant-->CHA2DS2VASc = 3;  b. On flecainide and dilt with prn propranolol.  . Asthma     a. ? 2/2 wood stove usage.  . Menopause       Surgical History:  Past Surgical History  Procedure Laterality Date  . Unremarkable       Home  Meds: Prior to Admission medications   Medication Sig Start Date End Date Taking? Authorizing Provider  apixaban (ELIQUIS) 5 MG TABS tablet Take 1 tablet (5 mg total) by mouth 2 (two) times daily. 11/19/14  Yes [name], MD  atorvastatin (LIPITOR) 10 MG tablet Take 1 tablet (10 mg total) by mouth daily. 11/19/14 03/09/16 Yes [name], MD  calcium carbonate (OS-CAL) 600 MG TABS Take 600 mg by mouth 2 (two) times daily with a meal.   Yes Historical Provider, MD  Cholecalciferol (VITAMIN D3) 2000 UNITS TABS Take 1 tablet by mouth daily.   Yes Historical Provider, MD  Cranberry 1000 MG CAPS Take by mouth daily.   Yes Historical Provider, MD  diltiazem (CARDIZEM) 30 MG tablet Take 1 tablet (30 mg total) by mouth 4 (four) times daily as needed. 10/21/13  Yes [name], MD  diltiazem (DILACOR XR) 180 MG 24 hr capsule TAKE ONE CAPSULE BY MOUTH EVERY NIGHT 01/13/15  Yes [name], MD  ESTRACE VAGINAL 0.1 MG/GM vaginal cream Place 1 Applicatorful vaginally as needed.  05/05/14  Yes Historical Provider, MD  flecainide (TAMBOCOR) 100 MG tablet Take 1 tablet (100 mg total) by mouth 2 (two) times daily. 11/19/14  Yes [name], MD  hydrochlorothiazide (MICROZIDE) 12.5 MG capsule Take 1 capsule (12.5 mg total) by mouth daily. 05/20/15  Yes [name], MD  levothyroxine (SYNTHROID, LEVOTHROID) 100 MCG tablet Take 100 mcg by mouth daily.  Yes Historical Provider, MD  losartan (COZAAR) 100 MG tablet Take 100 mg by mouth daily.   Yes Historical Provider, MD       Allergies: No Known Allergies  History   Social History  . Marital Status: Married    Spouse Name: N/A  . Number of Children: 3  . Years of Education: N/A   Occupational History  . PAYROLL CLERK     [employer]   Social History Main Topics  . Smoking status: Never Smoker   . Smokeless tobacco: Not on file  . Alcohol Use: No  . Drug Use: No  . Sexual Activity: Not on file   Other Topics  Concern  . Not on file   Social History Narrative     Family History  Problem Relation Age of Onset  . Heart failure Mother   . Heart attack Father      ROS:  Please see the history of present illness.     All other systems reviewed and negative.    Physical Exam:   Height 5\' 5"  (1.651 m), weight 174 lb 12 oz (79.266 kg). General: Well developed, well nourished female in no acute distress. Head: Normocephalic, atraumatic, sclera non-icteric, no xanthomas, nares are without discharge. EENT: normal Lymph Nodes:  none Back: without scoliosis/kyphosis , no CVA tendersness Neck: Negative for carotid bruits. JVD not elevated. Lungs: Clear bilaterally to auscultation without wheezes, rales, or rhonchi. Breathing is unlabored. Heart: RRR with S1 S2 2/6 systolic murmur , rubs, or gallops appreciated. Abdomen: Soft, non-tender, non-distended with normoactive bowel sounds. No hepatomegaly. No rebound/guarding. No obvious abdominal masses. Msk:  Strength and tone appear normal for age. Extremities: No clubbing or cyanosis. No   edema.  Distal pedal pulses are 2+ and equal bilaterally. Skin: Warm and Dry Neuro: Alert and oriented X 3. CN III-XII intact Grossly normal sensory and motor function . Psych:  Responds to questions appropriately with a normal affect.      Labs: Cardiac Enzymes No results for input(s): CKTOTAL, CKMB, TROPONINI in the last 72 hours. CBC Lab Results  Component Value Date   WBC 6.5 06/25/2014   HGB 12.4 06/25/2014   HCT 37.9 06/25/2014   MCV 84 06/25/2014   PLT CANCELED 06/25/2014   PROTIME: No results for input(s): LABPROT, INR in the last 72 hours. Chemistry No results for input(s): NA, K, CL, CO2, BUN, CREATININE, CALCIUM, PROT, BILITOT, ALKPHOS, ALT, AST, GLUCOSE in the last 168 hours.  Invalid input(s): LABALBU Lipids No results found for: CHOL, HDL, LDLCALC, TRIG BNP No results found for: PROBNP Thyroid Function Tests: No results for input(s):  TSH, T4TOTAL, T3FREE, THYROIDAB in the last 72 hours.  Invalid input(s): FREET3    Miscellaneous No results found for: DDIMER  Radiology/Studies:  No results found.  EKG:  Sinus rhythm with right bundle branch block   Assessment and Plan:   Atrial fibrillation Paroxysmal  Hypertension  Sinus node dysfunction    She has symptomatic recurrent atrial fibrillation but more prominently symptomatic sinus node dysfunction occurring even in the washed out. I'll drugs. Hence, I think pacing for symptomatic bradycardia is appropriate   An alternative strategy would be to discontinue the flecainide and a calcium blocker and attempt to use dofetilide; however, given the fact that there was recurrent sinus arrest or than 36 hours following discontinuation of her drugs I suspect that this would be unsatisfying as the sinus node issue would emerge even on dofetilide therapy.  The benefits and  risks were reviewed including but not limited to death,  perforation, infection, lead dislodgement and device malfunction.  The patient understands agrees and is willing to proceed.  She also has symptoms consistent with sleep apnea. Her blood pressure daytime somnolence and atrial fibrillation meal be mitigated by addressing her   I will defer this to Dr. Sabra Heck 2  Virl Axe

## 2015-06-22 NOTE — Interval H&P Note (Signed)
History and Physical Interval Note:  06/22/2015 7:51 AM  Hailey Simmons  has presented today for surgery, with the diagnosis of tachy/brady  The various methods of treatment have been discussed with the patient and family. After consideration of risks, benefits and other options for treatment, the patient has consented to  Procedure(s): Pacemaker Implant (N/A) as a surgical intervention .  The patient's history has been reviewed, patient examined, no change in status, stable for surgery.  I have reviewed the patient's chart and labs.  Questions were answered to the patient's satisfaction.     Virl Axe

## 2015-06-23 ENCOUNTER — Encounter (HOSPITAL_COMMUNITY): Payer: Self-pay | Admitting: Nurse Practitioner

## 2015-06-23 DIAGNOSIS — E039 Hypothyroidism, unspecified: Secondary | ICD-10-CM | POA: Diagnosis not present

## 2015-06-23 DIAGNOSIS — I495 Sick sinus syndrome: Secondary | ICD-10-CM | POA: Diagnosis not present

## 2015-06-23 DIAGNOSIS — I4510 Unspecified right bundle-branch block: Secondary | ICD-10-CM | POA: Diagnosis not present

## 2015-06-23 DIAGNOSIS — I480 Paroxysmal atrial fibrillation: Secondary | ICD-10-CM | POA: Diagnosis not present

## 2015-06-23 MED FILL — Heparin Sodium (Porcine) 2 Unit/ML in Sodium Chloride 0.9%: INTRAMUSCULAR | Qty: 500 | Status: AC

## 2015-06-23 NOTE — Discharge Summary (Signed)
ELECTROPHYSIOLOGY PROCEDURE DISCHARGE SUMMARY    Patient ID: Hailey Simmons,  MRN: [id number], DOB/AGE: March 20, 1936 79 y.o.  Admit date: 06/22/2015 Discharge date: 06/23/2015  Primary Care Physician: Rusty Aus., MD Primary Cardiologist: Rockey Situ Electrophysiologist: Caryl Comes  Primary Discharge Diagnosis:  Symptomatic bradycardia status post pacemaker implantation this admission  Secondary Discharge Diagnosis:  1.  Paroxysmal atrial fibrillation 2.  Hypothyroidism 3.  Prior SBO  No Known Allergies   Procedures This Admission:  1.  Implantation of a MDT MRI compatible dual chamber PPM on 06/22/15 by Dr Caryl Comes.  The patient received a MDT model number Advisa PPM with model number 5076 right atrial lead and 5076 right ventricular lead. There were no immediate post procedure complications. 2.  CXR on 06/22/15 demonstrated no pneumothorax status post device implantation.   Brief HPI: Hailey Simmons is a 79 y.o. female was referred to electrophysiology in the outpatient setting for consideration of PPM implantation.  Past medical history includes symptomatic bradycardia and paroxysmal atrial fibrillation with RVR.  The patient has had symptomatic bradycardia without reversible causes identified.  Risks, benefits, and alternatives to PPM implantation were reviewed with the patient who wished to proceed.   Hospital Course:  The patient was admitted and underwent implantation of a MDT dual chamber pacemaker with details as outlined above.  She  was monitored on telemetry overnight which demonstrated atrial pacing with intrinsic ventricular conduction and SR.  Left chest was without hematoma or ecchymosis.  The device was interrogated and found to be functioning normally.  CXR was obtained and demonstrated no pneumothorax status post device implantation.  Wound care, arm mobility, and restrictions were reviewed with the patient.  The patient was examined and considered stable for discharge to  home.    Physical Exam: Filed Vitals:   06/22/15 2100 06/22/15 2331 06/23/15 0100 06/23/15 0627  BP: 118/40 146/41  142/43  Pulse:  69  62  Temp:  97.8 F (36.6 C)  97.8 F (36.6 C)  TempSrc:  Oral  Oral  Resp: 14 16  13   Height:      Weight:   173 lb 1 oz (78.5 kg)   SpO2:  95%  94%    GEN- The patient is well appearing, alert and oriented x 3 today.   HEENT: normocephalic, atraumatic; sclera clear, conjunctiva pink; hearing intact; oropharynx clear; neck supple  Lungs- Clear to ausculation bilaterally, normal work of breathing.  No wheezes, rales, rhonchi Heart- Regular rate and rhythm  GI- soft, non-tender, non-distended, bowel sounds present, no hepatosplenomegaly Extremities- no clubbing, cyanosis, or edema; DP/PT/radial pulses 2+ bilaterally MS- no significant deformity or atrophy Skin- warm and dry, no rash or lesion, left chest without hematoma/ecchymosis Psych- euthymic mood, full affect Neuro- strength and sensation are intact   Labs:   Lab Results  Component Value Date   WBC 5.4 06/15/2015   HGB 12.4 06/25/2014   HCT 36.9 06/15/2015   MCV 84 06/25/2014   PLT CANCELED 06/25/2014   No results for input(s): NA, K, CL, CO2, BUN, CREATININE, CALCIUM, PROT, BILITOT, ALKPHOS, ALT, AST, GLUCOSE in the last 168 hours.  Invalid input(s): LABALBU  Discharge Medications:    Medication List    TAKE these medications        apixaban 5 MG Tabs tablet  Commonly known as:  ELIQUIS  Take 1 tablet (5 mg total) by mouth 2 (two) times daily. Resume 06/26/15     atorvastatin 10 MG tablet  Commonly known as:  LIPITOR  Take 1 tablet (10 mg total) by mouth daily.     calcium carbonate 600 MG Tabs tablet  Commonly known as:  OS-CAL  Take 600 mg by mouth 2 (two) times daily with a meal.     Cranberry 1000 MG Caps  Take 1 capsule by mouth daily.     diltiazem 180 MG 24 hr capsule  Commonly known as:  DILACOR XR  Take 180 mg by mouth daily as needed. For  afib per  patient     diltiazem 30 MG tablet  Commonly known as:  CARDIZEM  Take 1 tablet (30 mg total) by mouth 4 (four) times daily as needed.     diltiazem 180 MG 24 hr capsule  Commonly known as:  DILACOR XR  TAKE ONE CAPSULE BY MOUTH EVERY NIGHT     ESTRACE VAGINAL 0.1 MG/GM vaginal cream  Generic drug:  estradiol  Place 1 Applicatorful vaginally as needed.     flecainide 100 MG tablet  Commonly known as:  TAMBOCOR  Take 1 tablet (100 mg total) by mouth 2 (two) times daily.     hydrochlorothiazide 12.5 MG capsule  Commonly known as:  MICROZIDE  Take 1 capsule (12.5 mg total) by mouth daily.     levothyroxine 100 MCG tablet  Commonly known as:  SYNTHROID, LEVOTHROID  Take 100 mcg by mouth daily.     losartan 100 MG tablet  Commonly known as:  COZAAR  Take 100 mg by mouth daily.     Vitamin D3 2000 UNITS Tabs  Take 1 tablet by mouth daily.        Disposition:  Discharge Instructions    Diet - low sodium heart healthy    Complete by:  As directed      Diet - low sodium heart healthy    Complete by:  As directed      Increase activity slowly    Complete by:  As directed      Increase activity slowly    Complete by:  As directed           Follow-up Information    Follow up with [facility] On 07/05/2015.   Why:  at 9:45AM for wound check   Contact information:   [address]  [place] [id number]       Duration of Discharge Encounter: Greater than 30 minutes including physician time.  Signed, Chanetta Marshall, NP 06/23/2015 7:41 AM   EP attending  Patient seen and examined. Agree with above. Usual followup.  Mikle Bosworth.D.

## 2015-06-23 NOTE — Progress Notes (Signed)
IV/Tele removed by NT Barb.  D/C instructions reviewed w/ pt and husband.  Questions answered.  Denies complaints.  Wound well approx, no s/s infection or swelling.  To front exit by w/c with hosp volunteer.

## 2015-07-05 ENCOUNTER — Ambulatory Visit (INDEPENDENT_AMBULATORY_CARE_PROVIDER_SITE_OTHER): Payer: PPO | Admitting: *Deleted

## 2015-07-05 DIAGNOSIS — I480 Paroxysmal atrial fibrillation: Secondary | ICD-10-CM

## 2015-07-05 DIAGNOSIS — I495 Sick sinus syndrome: Secondary | ICD-10-CM | POA: Diagnosis not present

## 2015-07-05 DIAGNOSIS — Z950 Presence of cardiac pacemaker: Secondary | ICD-10-CM | POA: Diagnosis not present

## 2015-07-05 LAB — CUP PACEART INCLINIC DEVICE CHECK
Battery Voltage: 3.13 V
Brady Statistic AP VP Percent: 0.02 %
Brady Statistic AS VS Percent: 63.76 %
Brady Statistic RA Percent Paced: 36.19 %
Lead Channel Impedance Value: 532 Ohm
Lead Channel Impedance Value: 589 Ohm
Lead Channel Pacing Threshold Amplitude: 0.75 V
Lead Channel Pacing Threshold Amplitude: 1.125 V
Lead Channel Pacing Threshold Pulse Width: 0.4 ms
Lead Channel Pacing Threshold Pulse Width: 0.4 ms
Lead Channel Sensing Intrinsic Amplitude: 1.5 mV
Lead Channel Sensing Intrinsic Amplitude: 10.5 mV
Lead Channel Setting Pacing Amplitude: 3.5 V
Lead Channel Setting Sensing Sensitivity: 1.2 mV
MDC IDC MSMT LEADCHNL RA IMPEDANCE VALUE: 342 Ohm
MDC IDC MSMT LEADCHNL RA IMPEDANCE VALUE: 456 Ohm
MDC IDC MSMT LEADCHNL RA SENSING INTR AMPL: 1.25 mV
MDC IDC MSMT LEADCHNL RV SENSING INTR AMPL: 10.5 mV
MDC IDC SESS DTM: 20160802105611
MDC IDC SET LEADCHNL RV PACING AMPLITUDE: 3.5 V
MDC IDC SET LEADCHNL RV PACING PULSEWIDTH: 0.4 ms
MDC IDC STAT BRADY AP VS PERCENT: 36.17 %
MDC IDC STAT BRADY AS VP PERCENT: 0.05 %
MDC IDC STAT BRADY RV PERCENT PACED: 0.07 %
Zone Setting Detection Interval: 400 ms
Zone Setting Detection Interval: 400 ms

## 2015-07-05 NOTE — Progress Notes (Signed)
Wound check appointment. No steri-strips, dermabond used. Wound without redness or edema. Incision edges approximated, wound well healed. Normal device function. Thresholds, sensing, and impedances consistent with implant measurements. Device programmed at 3.5V/auto capture programmed on for extra safety margin until 3 month visit. Histogram distribution appropriate for patient and level of activity. No mode switches or high ventricular rates noted. Patient educated about wound care, arm mobility, lifting restrictions. ROV w/ SK/B in 37mo.

## 2015-07-19 ENCOUNTER — Telehealth: Payer: Self-pay | Admitting: *Deleted

## 2015-07-19 NOTE — Telephone Encounter (Signed)
Eliquis 5 mg samples placed at front desk for pick up. Please discuss blood thinner options.

## 2015-07-19 NOTE — Telephone Encounter (Signed)
Pt is in donut hole on Eliquis until December  Is asking if we can help her with this, she'd like samples until she gets out Please call patient she'd like to know her options.

## 2015-07-20 NOTE — Telephone Encounter (Signed)
Would try xarelto 20 mg daily Or pradaxa 150 mg twice a day Would consult insurance company (mail order pharmacy) Only other option is warfarin

## 2015-07-20 NOTE — Telephone Encounter (Signed)
S/w pt who asks if there is an alternate, more affordable blood thinner. Informed pt that Dr. Rockey Situ out of office this week but will ask him to address this when he returns. Pt states she just got refill from pharmacy and has samples so she has plenty until she hears back from [place]. Pt thanked me for the call with no further questions.

## 2015-07-21 NOTE — Telephone Encounter (Signed)
S/w pt regarding Dr. Donivan Scull recommendations. Pt states she will check prescription benefits to determine if either one if more affordable and will CB. Pt had no further questions.

## 2015-07-26 ENCOUNTER — Telehealth: Payer: Self-pay | Admitting: *Deleted

## 2015-07-26 NOTE — Telephone Encounter (Signed)
Patient called insurance:   Eliquis $ 144. A month  Xarelto $159. A month   Pradaxa: 163. A month  She will stay with Eliquis. Thanks

## 2015-07-26 NOTE — Telephone Encounter (Signed)
Patient will stay on Eliquis.

## 2015-08-02 ENCOUNTER — Ambulatory Visit (INDEPENDENT_AMBULATORY_CARE_PROVIDER_SITE_OTHER): Payer: Self-pay | Admitting: *Deleted

## 2015-08-02 ENCOUNTER — Ambulatory Visit (INDEPENDENT_AMBULATORY_CARE_PROVIDER_SITE_OTHER): Payer: PPO | Admitting: Internal Medicine

## 2015-08-02 DIAGNOSIS — IMO0001 Reserved for inherently not codable concepts without codable children: Secondary | ICD-10-CM

## 2015-08-02 DIAGNOSIS — T814XXA Infection following a procedure, initial encounter: Secondary | ICD-10-CM

## 2015-08-04 ENCOUNTER — Ambulatory Visit (INDEPENDENT_AMBULATORY_CARE_PROVIDER_SITE_OTHER): Payer: Self-pay | Admitting: *Deleted

## 2015-08-04 DIAGNOSIS — T814XXD Infection following a procedure, subsequent encounter: Secondary | ICD-10-CM

## 2015-08-04 DIAGNOSIS — IMO0001 Reserved for inherently not codable concepts without codable children: Secondary | ICD-10-CM

## 2015-08-04 NOTE — Progress Notes (Signed)
Suture abscess at medial portion of device pocket incision. Pt asymptomatic. No fever or chills. Abscess evacuated by Dr. Caryl Comes. Bandage and packed gauze applied by Dr Caryl Comes.  ROV w/ [place] device clniic 08/04/15.

## 2015-08-04 NOTE — Progress Notes (Signed)
Bandage and packed gauze removed. Wound reviewed by Dr. Caryl Comes. Antibiotic ointment applied. Medial incision opening repacked w/ iodoform. Bandage applied. Pt given extra tegaderms to cover area while showering. ROV w/ [place] device clinic.  Device not interrogated.

## 2015-08-09 ENCOUNTER — Ambulatory Visit (INDEPENDENT_AMBULATORY_CARE_PROVIDER_SITE_OTHER): Payer: PPO | Admitting: *Deleted

## 2015-08-09 DIAGNOSIS — T814XXD Infection following a procedure, subsequent encounter: Secondary | ICD-10-CM

## 2015-08-09 DIAGNOSIS — IMO0001 Reserved for inherently not codable concepts without codable children: Secondary | ICD-10-CM

## 2015-08-15 ENCOUNTER — Ambulatory Visit (INDEPENDENT_AMBULATORY_CARE_PROVIDER_SITE_OTHER): Payer: Self-pay | Admitting: *Deleted

## 2015-08-15 DIAGNOSIS — T814XXD Infection following a procedure, subsequent encounter: Secondary | ICD-10-CM

## 2015-08-15 DIAGNOSIS — IMO0001 Reserved for inherently not codable concepts without codable children: Secondary | ICD-10-CM

## 2015-08-15 NOTE — Progress Notes (Signed)
Wound re-check only. Incision has sealed. Pt states last week on Thur her wound packing came out. She attempted to reinsert it but could not do so due to the incision closing. No redness, fever, chills, or discharge from incision. Wound healing better. Wound reviewed by Dr. Caryl Comes. Pt instructed instructed to apply compress daily. Pt aware no follow up necessary in device clinic unless symptoms develop.  Device not interrogated.

## 2015-08-23 ENCOUNTER — Encounter: Payer: Self-pay | Admitting: Internal Medicine

## 2015-08-24 NOTE — Progress Notes (Signed)
Pacemaker wound re-check by Dr. Caryl Comes. Medial portion of incision was undressed & repacked by Dr. Caryl Comes.   Device not interrogated.   ROV for wound check in [facility] 08/15/15.

## 2015-08-29 ENCOUNTER — Telehealth: Payer: Self-pay

## 2015-08-29 NOTE — Telephone Encounter (Signed)
Pt would like Eliquis samples.  

## 2015-08-29 NOTE — Telephone Encounter (Signed)
Eliquis 5 mg samples at front desk for pick up. 

## 2015-09-27 ENCOUNTER — Encounter: Payer: Self-pay | Admitting: Internal Medicine

## 2015-09-27 ENCOUNTER — Ambulatory Visit (INDEPENDENT_AMBULATORY_CARE_PROVIDER_SITE_OTHER): Payer: PPO | Admitting: Internal Medicine

## 2015-09-27 VITALS — BP 120/60 | HR 68 | Ht 64.0 in | Wt 178.5 lb

## 2015-09-27 DIAGNOSIS — I480 Paroxysmal atrial fibrillation: Secondary | ICD-10-CM | POA: Diagnosis not present

## 2015-09-27 DIAGNOSIS — Z950 Presence of cardiac pacemaker: Secondary | ICD-10-CM

## 2015-09-27 DIAGNOSIS — I495 Sick sinus syndrome: Secondary | ICD-10-CM | POA: Diagnosis not present

## 2015-09-27 LAB — CUP PACEART INCLINIC DEVICE CHECK
Battery Voltage: 3.06 V
Brady Statistic AP VP Percent: 0.03 %
Brady Statistic AS VP Percent: 0.02 %
Brady Statistic AS VS Percent: 34.54 %
Date Time Interrogation Session: 20161025102303
Implantable Lead Implant Date: 20160720
Implantable Lead Implant Date: 20160720
Implantable Lead Location: 753859
Implantable Lead Model: 5076
Lead Channel Impedance Value: 475 Ohm
Lead Channel Impedance Value: 513 Ohm
Lead Channel Impedance Value: 570 Ohm
Lead Channel Pacing Threshold Amplitude: 0.75 V
Lead Channel Pacing Threshold Amplitude: 1 V
Lead Channel Sensing Intrinsic Amplitude: 10.625 mV
Lead Channel Sensing Intrinsic Amplitude: 10.875 mV
Lead Channel Sensing Intrinsic Amplitude: 2 mV
Lead Channel Setting Pacing Amplitude: 2 V
Lead Channel Setting Pacing Amplitude: 2.5 V
Lead Channel Setting Pacing Pulse Width: 0.4 ms
MDC IDC LEAD LOCATION: 753860
MDC IDC MSMT BATTERY REMAINING LONGEVITY: 133 mo
MDC IDC MSMT LEADCHNL RA IMPEDANCE VALUE: 380 Ohm
MDC IDC MSMT LEADCHNL RA PACING THRESHOLD PULSEWIDTH: 0.4 ms
MDC IDC MSMT LEADCHNL RA SENSING INTR AMPL: 1.875 mV
MDC IDC MSMT LEADCHNL RV PACING THRESHOLD PULSEWIDTH: 0.4 ms
MDC IDC SET LEADCHNL RV SENSING SENSITIVITY: 1.2 mV
MDC IDC STAT BRADY AP VS PERCENT: 65.42 %
MDC IDC STAT BRADY RA PERCENT PACED: 65.44 %
MDC IDC STAT BRADY RV PERCENT PACED: 0.05 %

## 2015-09-27 NOTE — Patient Instructions (Signed)
Medication Instructions: - Take HCTZ as needed  Labwork: - none  Procedures/Testing: - none  Follow-Up: - Remote monitoring is used to monitor your Pacemaker of ICD from home. This monitoring reduces the number of office visits required to check your device to one time per year. It allows [place] to keep an eye on the functioning of your device to ensure it is working properly. You are scheduled for a device check from home on 12/27/15. You may send your transmission at any time that day. If you have a wireless device, the transmission will be sent automatically. After your physician reviews your transmission, you will receive a postcard with your next transmission date.  - Your physician wants you to follow-up in: 9 months with Dr. Caryl Comes (July 2017) You will receive a reminder letter in the mail two months in advance. If you don't receive a letter, please call our office to schedule the follow-up appointment.  Any Additional Special Instructions Will Be Listed Below (If Applicable). - none

## 2015-09-27 NOTE — Progress Notes (Signed)
Patient Care Team: Rusty Aus, MD as PCP - General (Unknown Physician Specialty)   HPI  Hailey Simmons is a 79 y.o. female Seen in followuip for pacer implanted for sinus node dysfunction and PAF  This patients CHA2DS2-VASc Score and unadjusted Ischemic Stroke Rate (% per year) is equal to 4.8 % stroke rate/year from a score of 4  Above score calculated as 1 point each if present [CHF, HTN, DM, Vascular=MI/PAD/Aortic Plaque, Age if 65-74, or Female] Above score calculated as 2 points each if present [Age > 75, or Stroke/TIA/TE]    Procedure complicated by stitch abscess She's feeling much better with no more "washed out spells". She denies exercise intolerance.  She is tolerating her medications well without GI symptoms or bleeding.  She notes the apixoban is extensive  Records and Results Reviewed ECG  DATE QRSduration Dose  3/13 134 0  7/15 158 50  10/16 154 100     Past Medical History  Diagnosis Date  . Hypothyroidism   . Goiter   . Fibrocystic breast disease   . Small bowel obstruction ([facility]) 08/2009  . PAF (paroxysmal atrial fibrillation) ([facility])     on apixiban  . Menopause   . Sinus node dysfunction ([facility])     a. s/p MDT dual chamber pacemaker  . Basal cell carcinoma of skin of nose   . Hypertension   . Asthma 1970's - 1990's    a. ? 2/2 wood stove usage.    Past Surgical History  Procedure Laterality Date  . Ep implantable device N/A 06/22/2015    MDT MRI compatible dual chamber PPM implanted by Dr Caryl Comes for symptomatic bradycardia    Current Outpatient Prescriptions  Medication Sig Dispense Refill  . apixaban (ELIQUIS) 5 MG TABS tablet Take 1 tablet (5 mg total) by mouth 2 (two) times daily. Resume 06/26/15 60 tablet 6  . atorvastatin (LIPITOR) 10 MG tablet Take 1 tablet (10 mg total) by mouth daily. 90 tablet 3  . calcium carbonate (OS-CAL) 600 MG TABS Take 600 mg by mouth 2 (two) times daily with a meal.    . Cholecalciferol (VITAMIN D3)  2000 UNITS TABS Take 1 tablet by mouth daily.    . Cranberry 1000 MG CAPS Take 1 capsule by mouth daily.     Marland Kitchen diltiazem (CARDIZEM) 30 MG tablet Take 1 tablet (30 mg total) by mouth 4 (four) times daily as needed. 120 tablet 6  . diltiazem (DILACOR XR) 180 MG 24 hr capsule Take 180 mg by mouth daily as needed. For  afib per patient    . ESTRACE VAGINAL 0.1 MG/GM vaginal cream Place 1 Applicatorful vaginally as needed.     . flecainide (TAMBOCOR) 100 MG tablet Take 1 tablet (100 mg total) by mouth 2 (two) times daily. 180 tablet 3  . hydrochlorothiazide (MICROZIDE) 12.5 MG capsule Take one tablet by mouth daily as needed    . levothyroxine (SYNTHROID, LEVOTHROID) 100 MCG tablet Take 100 mcg by mouth daily.    Marland Kitchen losartan (COZAAR) 100 MG tablet Take 100 mg by mouth daily.     No current facility-administered medications for this visit.    No Known Allergies    Review of Systems negative except from HPI and PMH  Physical Exam BP 120/60 mmHg  Pulse 68  Ht 5\' 4"  (1.626 m)  Wt 178 lb 8 oz (80.967 kg)  BMI 30.62 kg/m2 Well developed and well nourished in no acute distress HENT normal  E scleral and icterus clear Neck Supple JVP flat; carotids brisk and full Clear to ausculation Device pocket well healed; without hematoma or erythema.  There is no tethering  Regular rate and rhythm, no murmurs gallops or rub Soft with active bowel sounds No clubbing cyanosis  Edema Alert and oriented, grossly normal motor and sensory function Skin Warm and Dry  ECG demonstrates atrial pacing at 68 Intervals 20/15/45 Right bundle branch block  Assessment and  Plan  Atrial fibrillation-paroxysmal  Sinus node dysfunction Pacemaker-Medtronic  Right bundle branch block  Heart rate excursion seems mildly blunted. Her symptoms are not very impressive however. We discussed potential benefits. She will let us know.  We will continue her on anticoagulation  With her blood pressure under such good  control and in the absence of edema, we will have her take her hydrochlorothiazide on an as-needed basis  She is tolerating her 1C agent in the context of her modest QRS duration prolongation

## 2015-10-17 ENCOUNTER — Telehealth: Payer: Self-pay | Admitting: *Deleted

## 2015-10-17 NOTE — Telephone Encounter (Signed)
Called pt to explain to her that we are out of samples of Eliquis 5 mg and if she wanted to call back later in the week to see if we have gotten any samples in, she could do that. Pt verbalized understanding.

## 2015-10-17 NOTE — Telephone Encounter (Signed)
Patient calling the office for samples of medication:   1.  What medication and dosage are you requesting samples for? Eliquis   2.  Are you currently out of this medication? About to be out Tmrw, she is in donut hole.

## 2015-10-18 ENCOUNTER — Encounter: Payer: Self-pay | Admitting: Internal Medicine

## 2015-10-25 ENCOUNTER — Encounter: Payer: PPO | Admitting: Internal Medicine

## 2015-10-25 ENCOUNTER — Telehealth: Payer: Self-pay | Admitting: *Deleted

## 2015-10-25 NOTE — Telephone Encounter (Signed)
Pt calling was asked to call this week to see if we had any.  She called last week and we did not have any  Patient calling the office for samples of medication:   1. What medication and dosage are you requesting samples for? Eliquis   2. Are you currentlyout of this medication? Is out. She is in donut hole.

## 2015-10-25 NOTE — Telephone Encounter (Signed)
Called pt to inform her that we did not have any samples of Eliquis 5 mg tablet and that she could call back later today to see if we have gotten any samples in. I advised the pt that if she has any other problems, questions or concerns to call the office. Pt verbalized understanding.

## 2015-12-07 DIAGNOSIS — C44519 Basal cell carcinoma of skin of other part of trunk: Secondary | ICD-10-CM | POA: Diagnosis not present

## 2015-12-27 ENCOUNTER — Ambulatory Visit (INDEPENDENT_AMBULATORY_CARE_PROVIDER_SITE_OTHER): Payer: PPO | Admitting: *Deleted

## 2015-12-27 ENCOUNTER — Telehealth: Payer: Self-pay | Admitting: Cardiology

## 2015-12-27 DIAGNOSIS — I495 Sick sinus syndrome: Secondary | ICD-10-CM

## 2015-12-27 NOTE — Telephone Encounter (Signed)
LMOVM reminding pt to send remote transmission.   

## 2015-12-28 NOTE — Progress Notes (Signed)
Remote pacemaker transmission.   

## 2016-01-03 ENCOUNTER — Other Ambulatory Visit: Payer: Self-pay | Admitting: Cardiovascular Disease

## 2016-01-09 DIAGNOSIS — I10 Essential (primary) hypertension: Secondary | ICD-10-CM | POA: Diagnosis not present

## 2016-01-09 DIAGNOSIS — Z0000 Encounter for general adult medical examination without abnormal findings: Secondary | ICD-10-CM | POA: Diagnosis not present

## 2016-01-09 DIAGNOSIS — E039 Hypothyroidism, unspecified: Secondary | ICD-10-CM | POA: Diagnosis not present

## 2016-01-09 DIAGNOSIS — E782 Mixed hyperlipidemia: Secondary | ICD-10-CM | POA: Diagnosis not present

## 2016-01-10 LAB — CUP PACEART REMOTE DEVICE CHECK
Battery Remaining Longevity: 131 mo
Battery Voltage: 3.04 V
Brady Statistic AP VP Percent: 0.02 %
Brady Statistic RA Percent Paced: 61.19 %
Brady Statistic RV Percent Paced: 0.05 %
Date Time Interrogation Session: 20170124183730
Implantable Lead Implant Date: 20160720
Implantable Lead Location: 753860
Implantable Lead Model: 5076
Implantable Lead Model: 5076
Lead Channel Impedance Value: 380 Ohm
Lead Channel Impedance Value: 494 Ohm
Lead Channel Impedance Value: 551 Ohm
Lead Channel Pacing Threshold Pulse Width: 0.4 ms
Lead Channel Sensing Intrinsic Amplitude: 2 mV
Lead Channel Sensing Intrinsic Amplitude: 2 mV
Lead Channel Setting Pacing Amplitude: 2 V
Lead Channel Setting Pacing Pulse Width: 0.4 ms
Lead Channel Setting Sensing Sensitivity: 1.2 mV
MDC IDC LEAD IMPLANT DT: 20160720
MDC IDC LEAD LOCATION: 753859
MDC IDC MSMT LEADCHNL RA IMPEDANCE VALUE: 418 Ohm
MDC IDC MSMT LEADCHNL RA PACING THRESHOLD AMPLITUDE: 1 V
MDC IDC MSMT LEADCHNL RV PACING THRESHOLD AMPLITUDE: 0.75 V
MDC IDC MSMT LEADCHNL RV PACING THRESHOLD PULSEWIDTH: 0.4 ms
MDC IDC MSMT LEADCHNL RV SENSING INTR AMPL: 10.75 mV
MDC IDC MSMT LEADCHNL RV SENSING INTR AMPL: 10.75 mV
MDC IDC SET LEADCHNL RV PACING AMPLITUDE: 2.5 V
MDC IDC STAT BRADY AP VS PERCENT: 61.17 %
MDC IDC STAT BRADY AS VP PERCENT: 0.03 %
MDC IDC STAT BRADY AS VS PERCENT: 38.78 %

## 2016-01-13 ENCOUNTER — Encounter: Payer: Self-pay | Admitting: Cardiology

## 2016-01-16 DIAGNOSIS — Z0000 Encounter for general adult medical examination without abnormal findings: Secondary | ICD-10-CM | POA: Diagnosis not present

## 2016-02-22 ENCOUNTER — Other Ambulatory Visit: Payer: Self-pay | Admitting: Internal Medicine

## 2016-02-22 DIAGNOSIS — Z1231 Encounter for screening mammogram for malignant neoplasm of breast: Secondary | ICD-10-CM

## 2016-03-06 ENCOUNTER — Ambulatory Visit
Admission: RE | Admit: 2016-03-06 | Discharge: 2016-03-06 | Disposition: A | Discharge status: Home / Self Care | Payer: PPO | Source: Ambulatory Visit | Attending: Internal Medicine | Admitting: Internal Medicine

## 2016-03-06 DIAGNOSIS — Z1231 Encounter for screening mammogram for malignant neoplasm of breast: Secondary | ICD-10-CM | POA: Insufficient documentation

## 2016-03-07 DIAGNOSIS — Z85828 Personal history of other malignant neoplasm of skin: Secondary | ICD-10-CM | POA: Diagnosis not present

## 2016-03-07 DIAGNOSIS — Z08 Encounter for follow-up examination after completed treatment for malignant neoplasm: Secondary | ICD-10-CM | POA: Diagnosis not present

## 2016-03-07 DIAGNOSIS — D1801 Hemangioma of skin and subcutaneous tissue: Secondary | ICD-10-CM | POA: Diagnosis not present

## 2016-03-07 DIAGNOSIS — L821 Other seborrheic keratosis: Secondary | ICD-10-CM | POA: Diagnosis not present

## 2016-03-27 ENCOUNTER — Ambulatory Visit (INDEPENDENT_AMBULATORY_CARE_PROVIDER_SITE_OTHER): Payer: PPO | Admitting: *Deleted

## 2016-03-27 ENCOUNTER — Telehealth: Payer: Self-pay | Admitting: Cardiology

## 2016-03-27 DIAGNOSIS — I495 Sick sinus syndrome: Secondary | ICD-10-CM

## 2016-03-27 NOTE — Progress Notes (Signed)
Remote pacemaker transmission.   

## 2016-03-27 NOTE — Telephone Encounter (Signed)
Attempted to confirm remote transmission with pt. No answer and was unable to leave a message.   

## 2016-05-03 LAB — CUP PACEART REMOTE DEVICE CHECK
Battery Remaining Longevity: 122 mo
Battery Voltage: 3.03 V
Brady Statistic AP VP Percent: 0.04 %
Brady Statistic AS VS Percent: 30.56 %
Date Time Interrogation Session: 20170425150551
Implantable Lead Location: 753859
Lead Channel Impedance Value: 361 Ohm
Lead Channel Impedance Value: 475 Ohm
Lead Channel Impedance Value: 475 Ohm
Lead Channel Pacing Threshold Amplitude: 0.625 V
Lead Channel Pacing Threshold Amplitude: 1.25 V
Lead Channel Pacing Threshold Pulse Width: 0.4 ms
Lead Channel Sensing Intrinsic Amplitude: 1.5 mV
Lead Channel Sensing Intrinsic Amplitude: 9.625 mV
Lead Channel Setting Pacing Amplitude: 2.75 V
Lead Channel Setting Pacing Pulse Width: 0.4 ms
MDC IDC LEAD IMPLANT DT: 20160720
MDC IDC LEAD IMPLANT DT: 20160720
MDC IDC LEAD LOCATION: 753860
MDC IDC MSMT LEADCHNL RA SENSING INTR AMPL: 1.5 mV
MDC IDC MSMT LEADCHNL RV IMPEDANCE VALUE: 532 Ohm
MDC IDC MSMT LEADCHNL RV PACING THRESHOLD PULSEWIDTH: 0.4 ms
MDC IDC MSMT LEADCHNL RV SENSING INTR AMPL: 9.625 mV
MDC IDC SET LEADCHNL RV PACING AMPLITUDE: 2.5 V
MDC IDC SET LEADCHNL RV SENSING SENSITIVITY: 1.2 mV
MDC IDC STAT BRADY AP VS PERCENT: 69.37 %
MDC IDC STAT BRADY AS VP PERCENT: 0.03 %
MDC IDC STAT BRADY RA PERCENT PACED: 69.4 %
MDC IDC STAT BRADY RV PERCENT PACED: 0.07 %

## 2016-05-04 ENCOUNTER — Encounter: Payer: Self-pay | Admitting: Cardiology

## 2016-05-16 ENCOUNTER — Encounter: Payer: Self-pay | Admitting: Cardiovascular Disease

## 2016-05-16 ENCOUNTER — Ambulatory Visit (INDEPENDENT_AMBULATORY_CARE_PROVIDER_SITE_OTHER): Payer: PPO | Admitting: Cardiovascular Disease

## 2016-05-16 VITALS — BP 120/58 | HR 72 | Ht 64.0 in | Wt 173.0 lb

## 2016-05-16 DIAGNOSIS — E785 Hyperlipidemia, unspecified: Secondary | ICD-10-CM | POA: Diagnosis not present

## 2016-05-16 DIAGNOSIS — I495 Sick sinus syndrome: Secondary | ICD-10-CM | POA: Diagnosis not present

## 2016-05-16 DIAGNOSIS — I480 Paroxysmal atrial fibrillation: Secondary | ICD-10-CM

## 2016-05-16 NOTE — Patient Instructions (Signed)
You are doing well. No medication changes were made.  Please call us if you have new issues that need to be addressed before your next appt.  Your physician wants you to follow-up in: 12 months.  You will receive a reminder letter in the mail two months in advance. If you don't receive a letter, please call our office to schedule the follow-up appointment. 

## 2016-05-16 NOTE — Progress Notes (Signed)
Patient ID: Hailey Simmons, female   DOB: Apr 23, 1936, 80 y.o.   MRN: [id number] Cardiology Office Note  Date:  05/16/2016   ID:  Hailey Simmons, DOB [birth year], MRN [id number]  PCP:  Rusty Aus, MD   Chief Complaint  Patient presents with  . other    1 yr f/u. Meds reviewed verbally.    HPI:  Ms. Hailey Simmons is a very pleasant 80 year old woman, patient of Dr. Emily Filbert, with history of hypothyroidism, hyperlipidemia , paroxysmal atrial fibrillation. In January 2013 was noted to have atrial fibrillation.  Echocardiogram showed normal ejection fraction, otherwise normal study. Diltiazem 120 mg daily was started. She presents today for follow-up of her atrial fibrillation flecainide increased up to 100 mg twice a day in clinic follow-up for continued atrial fibrillation  In follow up today, she reports that she is doing well.  She denies any tachycardia or symptoms concerning for arrhythmia Tolerating anticoagulation. We did review her pacemaker results that showed 10 episodes of AT/AF Longest episode was 42 minutes. She was asymptomatic  EKG on today's visit shows normal sinus rhythm with rate 72 bpm, right bundle branch block other past medical history  Other past medical history  periodic urinary tract infections  She is typically very active, takes care of 3 grandchildren several days per week.  Previous lab work showing TSH 3.0, total cholesterol 137, LDL 60  short episode of atrial fibrillation in January and possibly April 2014, lasting less than 30 minutes. atrial fibrillation June 3 in 05/23/2014. First episode was 4 hours, second episode lasted much longer.   strong family history of coronary artery disease, father 81 mg twice a day of heart attack at age 51, mother at age 34 both from cardiac disease. Tolerating Lipitor 10 mg daily  PMH:   has a past medical history of Hypothyroidism; Goiter; Fibrocystic breast disease; Small bowel obstruction ([place]) (08/2009);  PAF (paroxysmal atrial fibrillation) ([place]); Menopause; Sinus node dysfunction ([place]); Basal cell carcinoma of skin of nose; Hypertension; and Asthma (1970's - 1990's).  PSH:    Past Surgical History  Procedure Laterality Date  . Ep implantable device N/A 06/22/2015    MDT MRI compatible dual chamber PPM implanted by Dr Caryl Comes for symptomatic bradycardia    Current Outpatient Prescriptions  Medication Sig Dispense Refill  . apixaban (ELIQUIS) 5 MG TABS tablet Take 1 tablet (5 mg total) by mouth 2 (two) times daily. Resume 06/26/15 60 tablet 6  . atorvastatin (LIPITOR) 10 MG tablet TAKE ONE (1) TABLET EACH DAY 90 tablet 3  . calcium carbonate (OS-CAL) 600 MG TABS Take 600 mg by mouth 2 (two) times daily with a meal.    . Cholecalciferol (VITAMIN D3) 2000 UNITS TABS Take 1 tablet by mouth daily.    . Cranberry 1000 MG CAPS Take 1 capsule by mouth daily.     Marland Kitchen diltiazem (CARDIZEM) 30 MG tablet Take 1 tablet (30 mg total) by mouth 4 (four) times daily as needed. 120 tablet 6  . diltiazem (DILACOR XR) 180 MG 24 hr capsule Take 180 mg by mouth daily as needed. For  afib per patient    . ESTRACE VAGINAL 0.1 MG/GM vaginal cream Place 1 Applicatorful vaginally as needed.     . flecainide (TAMBOCOR) 100 MG tablet TAKE ONE TABLET TWICE DAILY 180 tablet 3  . hydrochlorothiazide (MICROZIDE) 12.5 MG capsule Take one tablet by mouth daily as needed    . levothyroxine (SYNTHROID, LEVOTHROID) 100 MCG tablet Take 100 mcg by  mouth daily.    Marland Kitchen losartan (COZAAR) 100 MG tablet Take 100 mg by mouth daily.     No current facility-administered medications for this visit.     Allergies:   Review of patient's allergies indicates no known allergies.   Social History:  The patient  reports that she has never smoked. She has never used smokeless tobacco. She reports that she does not drink alcohol or use illicit drugs.   Family History:   family history includes Heart attack in her father; Heart failure in her mother.  There is no history of Breast cancer.    Review of Systems: Review of Systems  Constitutional: Negative.   Respiratory: Negative.   Cardiovascular: Negative.   Gastrointestinal: Negative.   Musculoskeletal: Negative.   Neurological: Negative.   Psychiatric/Behavioral: Negative.   All other systems reviewed and are negative.    PHYSICAL EXAM: VS:  BP 120/58 mmHg  Pulse 72  Ht 5\' 4"  (1.626 m)  Wt 173 lb (78.472 kg)  BMI 29.68 kg/m2 , BMI Body mass index is 29.68 kg/(m^2). GEN: Well nourished, well developed, in no acute distress HEENT: normal Neck: no JVD, carotid bruits, or masses Cardiac: RRR; no murmurs, rubs, or gallops,no edema  Respiratory:  clear to auscultation bilaterally, normal work of breathing GI: soft, nontender, nondistended, + BS MS: no deformity or atrophy Skin: warm and dry, no rash Neuro:  Strength and sensation are intact Psych: euthymic mood, full affect    Recent Labs: 06/15/2015: BUN 13; Creatinine, Ser 0.82; Platelets 123*; Potassium 5.1; Sodium 133*    Lipid Panel No results found for: CHOL, HDL, LDLCALC, TRIG    Wt Readings from Last 3 Encounters:  05/16/16 173 lb (78.472 kg)  09/27/15 178 lb 8 oz (80.967 kg)  06/23/15 173 lb 1 oz (78.5 kg)       ASSESSMENT AND PLAN:  PAF (paroxysmal atrial fibrillation) (HCC) - Paroxysmal episodes on pacer download but she was asymptomatic No medication changes made  Sinus node dysfunction ([place]) Tolerating pacemaker, followed by Dr. Caryl Comes  Hyperlipidemia Currently on Lipitor  Disposition:   F/U  12 months   Orders Placed This Encounter  Procedures  . EKG 12-Lead    Total encounter time more than 15 minutes  Greater than 50% was spent in counseling and coordination of care with the patient   Signed, Esmond Plants, M.D., Ph.D. 05/16/2016  [place], [place]

## 2016-06-12 ENCOUNTER — Other Ambulatory Visit: Payer: Self-pay

## 2016-06-12 MED ORDER — HYDROCHLOROTHIAZIDE 12.5 MG PO CAPS: ORAL_CAPSULE | ORAL | Status: DC

## 2016-06-12 NOTE — Telephone Encounter (Signed)
Refill sent for HCTZ 12.5 mg one tablet daily.

## 2016-06-25 ENCOUNTER — Telehealth: Payer: Self-pay | Admitting: Cardiovascular Disease

## 2016-06-25 NOTE — Telephone Encounter (Signed)
Patient calling the office for samples of medication:   1.  What medication and dosage are you requesting samples for? Eliquis  5 mg 2x a day   2.  Are you currently out of this medication?  Yes

## 2016-06-25 NOTE — Telephone Encounter (Signed)
Notified patient samples available to pick up at the front desk.

## 2016-07-10 ENCOUNTER — Encounter: Payer: Self-pay | Admitting: Cardiovascular Disease

## 2016-07-10 ENCOUNTER — Ambulatory Visit (INDEPENDENT_AMBULATORY_CARE_PROVIDER_SITE_OTHER): Payer: PPO | Admitting: Internal Medicine

## 2016-07-10 ENCOUNTER — Encounter: Payer: Self-pay | Admitting: Internal Medicine

## 2016-07-10 ENCOUNTER — Encounter (INDEPENDENT_AMBULATORY_CARE_PROVIDER_SITE_OTHER): Payer: Self-pay

## 2016-07-10 VITALS — BP 147/83 | HR 86 | Ht 64.0 in | Wt 175.8 lb

## 2016-07-10 DIAGNOSIS — I495 Sick sinus syndrome: Secondary | ICD-10-CM

## 2016-07-10 DIAGNOSIS — I480 Paroxysmal atrial fibrillation: Secondary | ICD-10-CM

## 2016-07-10 DIAGNOSIS — M25519 Pain in unspecified shoulder: Secondary | ICD-10-CM

## 2016-07-10 DIAGNOSIS — Z950 Presence of cardiac pacemaker: Secondary | ICD-10-CM

## 2016-07-10 DIAGNOSIS — I4510 Unspecified right bundle-branch block: Secondary | ICD-10-CM | POA: Diagnosis not present

## 2016-07-10 NOTE — Patient Instructions (Addendum)
Medication Instructions: - Your physician recommends that you continue on your current medications as directed. Please refer to the Current Medication list given to you today.  Labwork: - Your physician recommends that you have lab work today:BMP/CBC/TSH  Procedures/Testing: - Your physician has requested that you have a [place].   Follow-Up: - Remote monitoring is used to monitor your Pacemaker of ICD from home. This monitoring reduces the number of office visits required to check your device to one time per year. It allows [place] to keep an eye on the functioning of your device to ensure it is working properly. You are scheduled for a device check from home on 10/09/16. You may send your transmission at any time that day. If you have a wireless device, the transmission will be sent automatically. After your physician reviews your transmission, you will receive a postcard with your next transmission date.  - Your physician wants you to follow-up in: 1 year with Dr. Caryl Comes. You will receive a reminder letter in the mail two months in advance. If you don't receive a letter, please call our office to schedule the follow-up appointment.  Any Additional Special Instructions Will Be Listed Below (If Applicable).    [place]  Your caregiver has ordered a Stress Test with nuclear imaging. The purpose of this test is to evaluate the blood supply to your heart muscle. This procedure is referred to as a "Non-Invasive Stress Test." This is because other than having an IV started in your vein, nothing is inserted or "invades" your body. Cardiac stress tests are done to find areas of poor blood flow to the heart by determining the extent of coronary artery disease (CAD). Some patients exercise on a treadmill, which naturally increases the blood flow to your heart, while others who are  unable to walk on a treadmill due to physical limitations have a pharmacologic/chemical stress agent called Lexiscan .  This medicine will mimic walking on a treadmill by temporarily increasing your coronary blood flow.   Please note: these test may take anywhere between 2-4 hours to complete  PLEASE REPORT TO [place] AT THE FIRST DESK WILL DIRECT YOU WHERE TO GO  Date of Procedure:_____________________________________  Arrival Time for Procedure:______________________________  Instructions regarding medication:   ____ : Hold diabetes medication morning of procedure  ____:  Hold betablocker(s) night before procedure and morning of procedure  ____:  Hold other medications as follows:_________________________________________________________________________________________________________________________________________________________________________________________________________________________________________________________________________________________  PLEASE NOTIFY THE OFFICE AT LEAST 24 HOURS IN ADVANCE IF YOU ARE UNABLE TO KEEP YOUR APPOINTMENT.  [phone] AND  PLEASE NOTIFY NUCLEAR MEDICINE AT [facility] AT LEAST 24 HOURS IN ADVANCE IF YOU ARE UNABLE TO KEEP YOUR APPOINTMENT. [phone]  How to prepare for your Myoview test:  1. Do not eat or drink after midnight 2. No caffeine for 24 hours prior to test 3. No smoking 24 hours prior to test. 4. Your medication may be taken with water.  If your doctor stopped a medication because of this test, do not take that medication. 5. Ladies, please do not wear dresses.  Skirts or pants are appropriate. Please wear a short sleeve shirt. 6. No perfume, cologne or lotion. 7. Wear comfortable walking shoes. No heels!             If you need a refill on your cardiac medications before your next appointment, please call your pharmacy.

## 2016-07-10 NOTE — Progress Notes (Signed)
Patient Care Team: Rusty Aus, MD as PCP - General (Unknown Physician Specialty) [name], MD as Consulting Physician (Cardiology)   HPI  Hailey Simmons is a 80 y.o. female Seen in followuip for pacer implanted for sinus node dysfunction and PAF  This patients CHA2DS2-VASc Score and unadjusted Ischemic Stroke Rate (% per year) is equal to 4.8 % stroke rate/year from a score of 4  Above score calculated as 1 point each if present [CHF, HTN, DM, Vascular=MI/PAD/Aortic Plaque, Age if 65-74, or Female] Above score calculated as 2 points each if present [Age > 75, or Stroke/TIA/TE]  She notes discomfort across her shoulders with exertion. It is relieved by rest. It is unaccompanied by shortness of breath. I reviewed her stress test from 7/15. No ischemic changes were noted. Lipid profile 2/17 LDL was in the 50-60 range.  She is tolerating her medications well without GI symptoms or bleeding.      Records and Results Reviewed ECG records from her primary care doctor include blood pressures>>> 01/16/16 130/62  03/18/15 150/67  01/12/15 144/56      DATE QRSduration Dose  3/13 134 0  7/15 158 50  10/16 154 100  6/17` 146 100     Past Medical History:  Diagnosis Date  . Asthma 1970's - 1990's   a. ? 2/2 wood stove usage.  . Basal cell carcinoma of skin of nose   . Fibrocystic breast disease   . Goiter   . Hypertension   . Hypothyroidism   . Menopause   . PAF (paroxysmal atrial fibrillation) ([place])    on apixoban  . Sinus node dysfunction (H[facility])    a. s/p MDT dual chamber pacemaker  . Small bowel obstruction ([place]) 08/2009    Past Surgical History:  Procedure Laterality Date  . EP IMPLANTABLE DEVICE N/A 06/22/2015   MDT MRI compatible dual chamber PPM implanted by Dr Caryl Comes for symptomatic bradycardia    Current Outpatient Prescriptions  Medication Sig Dispense Refill  . apixaban (ELIQUIS) 5 MG TABS tablet Take 1 tablet (5 mg total) by mouth 2 (two)  times daily. Resume 06/26/15 60 tablet 6  . atorvastatin (LIPITOR) 10 MG tablet TAKE ONE (1) TABLET EACH DAY 90 tablet 3  . calcium carbonate (OS-CAL) 600 MG TABS Take 600 mg by mouth 2 (two) times daily with a meal.    . Cholecalciferol (VITAMIN D3) 2000 UNITS TABS Take 1 tablet by mouth daily.    . Cranberry 1000 MG CAPS Take 1 capsule by mouth daily.     Marland Kitchen diltiazem (CARDIZEM) 30 MG tablet Take 1 tablet (30 mg total) by mouth 4 (four) times daily as needed. 120 tablet 6  . diltiazem (DILACOR XR) 180 MG 24 hr capsule Take 180 mg by mouth daily as needed. For  afib per patient    . ESTRACE VAGINAL 0.1 MG/GM vaginal cream Place 1 Applicatorful vaginally as needed.     . flecainide (TAMBOCOR) 100 MG tablet TAKE ONE TABLET TWICE DAILY 180 tablet 3  . hydrochlorothiazide (MICROZIDE) 12.5 MG capsule Take one tablet by mouth daily as needed 90 capsule 3  . levothyroxine (SYNTHROID, LEVOTHROID) 100 MCG tablet Take 100 mcg by mouth daily.    Marland Kitchen losartan (COZAAR) 100 MG tablet Take 100 mg by mouth daily.     No current facility-administered medications for this visit.     No Known Allergies    Review of Systems negative except from HPI and PMH  Physical Exam BP (!) 147/83 (BP Location: Left Arm, Patient Position: Sitting, Cuff Size: Normal)   Pulse 86   Ht 5\' 4"  (1.626 m)   Wt 175 lb 12 oz (79.7 kg)   BMI 30.17 kg/m  Well developed and well nourished in no acute distress HENT normal E scleral and icterus clear Neck Supple JVP flat; carotids brisk and full Clear to ausculation Device pocket well healed; without hematoma or erythema.  There is no tethering  Regular rate and rhythm, no murmurs gallops or rub Soft with active bowel sounds No clubbing cyanosis  Edema Alert and oriented, grossly normal motor and sensory function Skin Warm and Dry  ECG demonstrates atrial pacing at 68 Intervals 20/15/45 Right bundle branch block  Assessment and  Plan  Atrial  fibrillation-paroxysmal  Sinus node dysfunction  Pacemaker-[employer]tronic  Right bundle branch block  Atypical chest pain  Hypertension     We will continue her on anticoagulation   She is tolerating her 1C agent in the context of her modest QRS duration prolongation  I am bothered by her exertional chest discomfort. Her exercise tolerance was not great last treadmill she also has significant exercise induced hypertension. We will undertake Myoview scanning. Flecainide also prompts the need for higher sensitivity analysis because of its potential proarrhythmia  With her history of exercise associated hypertension and multiple blood pressure is elevated, which initiated for target blood pressure 130 range. Increase her diltiazem might be our next best thing. I would however encourage her to measure her blood pressures at home

## 2016-07-11 LAB — CBC WITH DIFFERENTIAL
BASOS: 0 %
Basophils Absolute: 0 10*3/uL (ref 0.0–0.2)
EOS (ABSOLUTE): 0.3 10*3/uL (ref 0.0–0.4)
EOS: 4 %
HEMATOCRIT: 37.8 % (ref 34.0–46.6)
Hemoglobin: 12.5 g/dL (ref 11.1–15.9)
IMMATURE GRANULOCYTES: 0 %
Immature Grans (Abs): 0 10*3/uL (ref 0.0–0.1)
LYMPHS ABS: 1.9 10*3/uL (ref 0.7–3.1)
Lymphs: 26 %
MCH: 28.5 pg (ref 26.6–33.0)
MCHC: 33.1 g/dL (ref 31.5–35.7)
MCV: 86 fL (ref 79–97)
MONOS ABS: 0.5 10*3/uL (ref 0.1–0.9)
Monocytes: 6 %
NEUTROS ABS: 4.6 10*3/uL (ref 1.4–7.0)
Neutrophils: 64 %
RBC: 4.39 x10E6/uL (ref 3.77–5.28)
RDW: 12.9 % (ref 12.3–15.4)
WBC: 7.4 10*3/uL (ref 3.4–10.8)

## 2016-07-11 LAB — TSH: TSH: 0.168 u[IU]/mL — ABNORMAL LOW (ref 0.450–4.500)

## 2016-07-11 LAB — BASIC METABOLIC PANEL
BUN/Creatinine Ratio: 23 (ref 12–28)
BUN: 18 mg/dL (ref 8–27)
CO2: 25 mmol/L (ref 18–29)
Calcium: 9.7 mg/dL (ref 8.7–10.3)
Chloride: 95 mmol/L — ABNORMAL LOW (ref 96–106)
Creatinine, Ser: 0.79 mg/dL (ref 0.57–1.00)
GFR, EST AFRICAN AMERICAN: 84 mL/min/{1.73_m2} (ref >59)
GFR, EST NON AFRICAN AMERICAN: 72 mL/min/{1.73_m2} (ref >59)
Glucose: 87 mg/dL (ref 65–99)
POTASSIUM: 4.9 mmol/L (ref 3.5–5.2)
Sodium: 137 mmol/L (ref 134–144)

## 2016-07-16 ENCOUNTER — Encounter: Payer: Self-pay | Admitting: Internal Medicine

## 2016-07-16 ENCOUNTER — Telehealth: Payer: Self-pay | Admitting: Cardiovascular Disease

## 2016-07-16 NOTE — Telephone Encounter (Signed)
Reviewed results w/ pt.  Advised of Dr. Olin Pia recommendation. Pt verbalizes understanding and will call back w/ any questions or concerns.

## 2016-07-16 NOTE — Telephone Encounter (Signed)
Patient is returning a call from last week regarding lab results.

## 2016-07-23 ENCOUNTER — Encounter
Admission: RE | Admit: 2016-07-23 | Discharge: 2016-07-23 | Disposition: A | Discharge status: Home / Self Care | Payer: PPO | Source: Ambulatory Visit | Attending: Internal Medicine | Admitting: Internal Medicine

## 2016-07-23 DIAGNOSIS — M25519 Pain in unspecified shoulder: Secondary | ICD-10-CM | POA: Diagnosis not present

## 2016-07-23 LAB — NM MYOCAR MULTI W/SPECT W/WALL MOTION / EF
CHL CUP MPHR: 143 {beats}/min
CHL CUP NUCLEAR SDS: 0
CSEPHR: 55 %
Estimated workload: 1 METS
Exercise duration (min): 0 min
Exercise duration (sec): 0 s
LVDIAVOL: 18 mL (ref 46–106)
LVSYSVOL: 54 mL
Peak HR: 79 {beats}/min
Rest HR: 64 {beats}/min
SRS: 0
SSS: 0
TID: 1.16

## 2016-07-23 MED ORDER — TECHNETIUM TC 99M TETROFOSMIN IV KIT
13.0000 | PACK | Freq: Once | INTRAVENOUS | Status: AC | PRN
  Administered 2016-07-23: 11.383 via INTRAVENOUS

## 2016-07-23 MED ORDER — TECHNETIUM TC 99M TETROFOSMIN IV KIT
30.0000 | PACK | Freq: Once | INTRAVENOUS | Status: AC | PRN
  Administered 2016-07-23: 29.59 via INTRAVENOUS

## 2016-07-23 MED ORDER — REGADENOSON 0.4 MG/5ML IV SOLN
0.4000 mg | Freq: Once | INTRAVENOUS | Status: AC
  Administered 2016-07-23: 0.4 mg via INTRAVENOUS

## 2016-08-02 ENCOUNTER — Encounter: Payer: Self-pay | Admitting: Internal Medicine

## 2016-08-04 ENCOUNTER — Encounter: Payer: Self-pay | Admitting: Cardiovascular Disease

## 2016-08-07 ENCOUNTER — Encounter: Payer: Self-pay | Admitting: Cardiovascular Disease

## 2016-08-09 LAB — CUP PACEART INCLINIC DEVICE CHECK
Date Time Interrogation Session: 20170907175449
Implantable Lead Location: 753859
Implantable Lead Location: 753860
Implantable Lead Model: 5076
Implantable Lead Model: 5076
MDC IDC LEAD IMPLANT DT: 20160720
MDC IDC LEAD IMPLANT DT: 20160720

## 2016-09-10 ENCOUNTER — Encounter: Payer: Self-pay | Admitting: Cardiovascular Disease

## 2016-10-04 ENCOUNTER — Encounter: Payer: Self-pay | Admitting: Cardiovascular Disease

## 2016-10-15 ENCOUNTER — Ambulatory Visit (INDEPENDENT_AMBULATORY_CARE_PROVIDER_SITE_OTHER): Payer: PPO | Admitting: *Deleted

## 2016-10-15 ENCOUNTER — Telehealth: Payer: Self-pay | Admitting: Cardiology

## 2016-10-15 DIAGNOSIS — I495 Sick sinus syndrome: Secondary | ICD-10-CM | POA: Diagnosis not present

## 2016-10-15 NOTE — Telephone Encounter (Signed)
LMOVM reminding pt to send remote transmission.   

## 2016-10-16 NOTE — Progress Notes (Signed)
Remote pacemaker transmission.   

## 2016-10-26 LAB — CUP PACEART REMOTE DEVICE CHECK
Battery Voltage: 3.02 V
Brady Statistic AP VS Percent: 73.18 %
Brady Statistic AS VS Percent: 26.77 %
Brady Statistic RA Percent Paced: 73.22 %
Date Time Interrogation Session: 20171113221214
Implantable Lead Implant Date: 20160720
Implantable Lead Location: 753860
Implantable Lead Model: 5076
Implantable Pulse Generator Implant Date: 20160720
Lead Channel Impedance Value: 342 Ohm
Lead Channel Impedance Value: 475 Ohm
Lead Channel Impedance Value: 513 Ohm
Lead Channel Pacing Threshold Amplitude: 1.125 V
Lead Channel Pacing Threshold Pulse Width: 0.4 ms
Lead Channel Pacing Threshold Pulse Width: 0.4 ms
Lead Channel Sensing Intrinsic Amplitude: 1.375 mV
Lead Channel Setting Pacing Amplitude: 2.25 V
Lead Channel Setting Pacing Amplitude: 2.5 V
Lead Channel Setting Sensing Sensitivity: 1.2 mV
MDC IDC LEAD IMPLANT DT: 20160720
MDC IDC LEAD LOCATION: 753859
MDC IDC MSMT BATTERY REMAINING LONGEVITY: 109 mo
MDC IDC MSMT LEADCHNL RA IMPEDANCE VALUE: 399 Ohm
MDC IDC MSMT LEADCHNL RA SENSING INTR AMPL: 1.375 mV
MDC IDC MSMT LEADCHNL RV PACING THRESHOLD AMPLITUDE: 0.75 V
MDC IDC MSMT LEADCHNL RV SENSING INTR AMPL: 9.875 mV
MDC IDC MSMT LEADCHNL RV SENSING INTR AMPL: 9.875 mV
MDC IDC SET LEADCHNL RV PACING PULSEWIDTH: 0.4 ms
MDC IDC STAT BRADY AP VP PERCENT: 0.04 %
MDC IDC STAT BRADY AS VP PERCENT: 0.02 %
MDC IDC STAT BRADY RV PERCENT PACED: 0.06 %

## 2016-11-05 ENCOUNTER — Encounter: Payer: Self-pay | Admitting: Cardiovascular Disease

## 2016-11-06 ENCOUNTER — Encounter: Payer: Self-pay | Admitting: *Deleted

## 2016-11-06 ENCOUNTER — Encounter: Payer: Self-pay | Admitting: Cardiovascular Disease

## 2016-11-06 ENCOUNTER — Telehealth: Payer: Self-pay | Admitting: Cardiovascular Disease

## 2016-11-06 NOTE — Telephone Encounter (Signed)
Patient sent in [place] message requesting samples. Medication Samples have been provided to the patient.  Drug name: Eliquis       Strength: 5 mg        Qty: 4 boxes LOT: [id number]  Exp.Date: [facility] 2020

## 2016-12-05 ENCOUNTER — Encounter: Payer: Self-pay | Admitting: Cardiovascular Disease

## 2016-12-05 ENCOUNTER — Telehealth: Payer: Self-pay | Admitting: *Deleted

## 2016-12-05 NOTE — Telephone Encounter (Signed)
Medication Samples have been left at front desk for patient to pick up at earliest convenience. Patient was notified of pick up via My Chart email response.  Drug name: Eliquis       Strength: 5mg         Qty: 4 boxes (56 tablets)   LOT: [id number]  Exp.Date: march 2020  Dosing instructions: Take 1 tablet by mouth twice a day.

## 2017-01-02 ENCOUNTER — Encounter: Payer: Self-pay | Admitting: Cardiovascular Disease

## 2017-01-02 ENCOUNTER — Telehealth: Payer: Self-pay | Admitting: Cardiovascular Disease

## 2017-01-02 NOTE — Telephone Encounter (Signed)
Patient sent in message for samples of medication:  Medication Samples have been provided to the patient.  Drug name: Eliquis       Strength: 5mg         Qty: 4 boxes  LOT: [id number]  Exp.Date: Mar 2020  Dosing instructions: Take one tablet twice a day  Samples placed up front for patient to pick up and reply sent via mychart.

## 2017-01-07 ENCOUNTER — Other Ambulatory Visit: Payer: Self-pay | Admitting: *Deleted

## 2017-01-07 MED ORDER — ATORVASTATIN CALCIUM 10 MG PO TABS: ORAL_TABLET | ORAL | 3 refills | Status: DC

## 2017-01-07 MED ORDER — ATORVASTATIN CALCIUM 10 MG PO TABS: ORAL_TABLET | ORAL | 3 refills | Status: AC

## 2017-01-10 DIAGNOSIS — Z0000 Encounter for general adult medical examination without abnormal findings: Secondary | ICD-10-CM | POA: Diagnosis not present

## 2017-01-14 ENCOUNTER — Ambulatory Visit (INDEPENDENT_AMBULATORY_CARE_PROVIDER_SITE_OTHER): Payer: PPO | Admitting: *Deleted

## 2017-01-14 ENCOUNTER — Telehealth: Payer: Self-pay | Admitting: Cardiology

## 2017-01-14 DIAGNOSIS — I495 Sick sinus syndrome: Secondary | ICD-10-CM | POA: Diagnosis not present

## 2017-01-14 NOTE — Telephone Encounter (Signed)
Spoke with pt and reminded pt of remote transmission that is due today. Pt verbalized understanding.   

## 2017-01-15 ENCOUNTER — Encounter: Payer: Self-pay | Admitting: Cardiology

## 2017-01-15 LAB — CUP PACEART REMOTE DEVICE CHECK
Battery Remaining Longevity: 103 mo
Battery Voltage: 3.02 V
Brady Statistic AP VP Percent: 0.03 %
Brady Statistic AP VS Percent: 63.32 %
Brady Statistic AS VS Percent: 36.62 %
Implantable Lead Implant Date: 20160720
Implantable Lead Implant Date: 20160720
Implantable Lead Location: 753859
Implantable Lead Location: 753860
Implantable Lead Model: 5076
Implantable Pulse Generator Implant Date: 20160720
Lead Channel Impedance Value: 342 Ohm
Lead Channel Impedance Value: 399 Ohm
Lead Channel Impedance Value: 475 Ohm
Lead Channel Pacing Threshold Amplitude: 0.75 V
Lead Channel Pacing Threshold Amplitude: 1 V
Lead Channel Pacing Threshold Pulse Width: 0.4 ms
Lead Channel Sensing Intrinsic Amplitude: 2 mV
Lead Channel Sensing Intrinsic Amplitude: 9 mV
Lead Channel Setting Pacing Amplitude: 2 V
MDC IDC MSMT LEADCHNL RA PACING THRESHOLD PULSEWIDTH: 0.4 ms
MDC IDC MSMT LEADCHNL RA SENSING INTR AMPL: 2 mV
MDC IDC MSMT LEADCHNL RV IMPEDANCE VALUE: 494 Ohm
MDC IDC MSMT LEADCHNL RV SENSING INTR AMPL: 9 mV
MDC IDC SESS DTM: 20180212195002
MDC IDC SET LEADCHNL RV PACING AMPLITUDE: 2.5 V
MDC IDC SET LEADCHNL RV PACING PULSEWIDTH: 0.4 ms
MDC IDC SET LEADCHNL RV SENSING SENSITIVITY: 1.2 mV
MDC IDC STAT BRADY AS VP PERCENT: 0.03 %
MDC IDC STAT BRADY RA PERCENT PACED: 63.34 %
MDC IDC STAT BRADY RV PERCENT PACED: 0.06 %

## 2017-01-15 NOTE — Progress Notes (Signed)
Remote pacemaker transmission.   

## 2017-01-17 DIAGNOSIS — Z0000 Encounter for general adult medical examination without abnormal findings: Secondary | ICD-10-CM | POA: Diagnosis not present

## 2017-01-17 DIAGNOSIS — I480 Paroxysmal atrial fibrillation: Secondary | ICD-10-CM | POA: Diagnosis not present

## 2017-01-22 DIAGNOSIS — H25813 Combined forms of age-related cataract, bilateral: Secondary | ICD-10-CM | POA: Diagnosis not present

## 2017-02-26 ENCOUNTER — Encounter: Payer: Self-pay | Admitting: Cardiovascular Disease

## 2017-02-27 ENCOUNTER — Encounter: Payer: Self-pay | Admitting: Cardiovascular Disease

## 2017-02-27 ENCOUNTER — Other Ambulatory Visit: Payer: Self-pay | Admitting: Internal Medicine

## 2017-02-27 DIAGNOSIS — Z1231 Encounter for screening mammogram for malignant neoplasm of breast: Secondary | ICD-10-CM

## 2017-03-04 ENCOUNTER — Encounter: Payer: Self-pay | Admitting: Cardiovascular Disease

## 2017-03-07 DIAGNOSIS — L900 Lichen sclerosus et atrophicus: Secondary | ICD-10-CM | POA: Diagnosis not present

## 2017-03-07 DIAGNOSIS — I788 Other diseases of capillaries: Secondary | ICD-10-CM | POA: Diagnosis not present

## 2017-03-07 DIAGNOSIS — Z85828 Personal history of other malignant neoplasm of skin: Secondary | ICD-10-CM | POA: Diagnosis not present

## 2017-03-07 DIAGNOSIS — Z08 Encounter for follow-up examination after completed treatment for malignant neoplasm: Secondary | ICD-10-CM | POA: Diagnosis not present

## 2017-03-26 ENCOUNTER — Ambulatory Visit
Admission: RE | Admit: 2017-03-26 | Discharge: 2017-03-26 | Disposition: A | Discharge status: Home / Self Care | Payer: PPO | Source: Ambulatory Visit | Attending: Internal Medicine | Admitting: Internal Medicine

## 2017-03-26 DIAGNOSIS — Z1231 Encounter for screening mammogram for malignant neoplasm of breast: Secondary | ICD-10-CM | POA: Diagnosis not present

## 2017-04-15 ENCOUNTER — Telehealth: Payer: Self-pay | Admitting: *Deleted

## 2017-04-15 ENCOUNTER — Ambulatory Visit (INDEPENDENT_AMBULATORY_CARE_PROVIDER_SITE_OTHER): Payer: PPO | Admitting: *Deleted

## 2017-04-15 ENCOUNTER — Encounter: Payer: Self-pay | Admitting: Cardiovascular Disease

## 2017-04-15 DIAGNOSIS — I495 Sick sinus syndrome: Secondary | ICD-10-CM

## 2017-04-15 NOTE — Progress Notes (Signed)
Remote pacemaker transmission.   

## 2017-04-15 NOTE — Telephone Encounter (Signed)
LVM stating that remote transmission was received.

## 2017-04-15 NOTE — Telephone Encounter (Signed)
Patient calling, states that she was trying to send transmission for pacer check but her grandson had unplugged machine and so she would like to know how long it will take to charge device. Please call to discuss, thanks.

## 2017-04-16 ENCOUNTER — Encounter: Payer: Self-pay | Admitting: Cardiology

## 2017-04-16 ENCOUNTER — Encounter: Payer: Self-pay | Admitting: Cardiovascular Disease

## 2017-04-17 LAB — CUP PACEART REMOTE DEVICE CHECK
Battery Remaining Longevity: 101 mo
Battery Voltage: 3.02 V
Brady Statistic AP VS Percent: 68.72 %
Brady Statistic AS VP Percent: 0.02 %
Brady Statistic AS VS Percent: 31.24 %
Brady Statistic RV Percent Paced: 0.05 %
Date Time Interrogation Session: 20180514143246
Implantable Lead Implant Date: 20160720
Implantable Lead Implant Date: 20160720
Implantable Lead Location: 753859
Implantable Lead Model: 5076
Implantable Pulse Generator Implant Date: 20160720
Lead Channel Impedance Value: 361 Ohm
Lead Channel Impedance Value: 475 Ohm
Lead Channel Impedance Value: 532 Ohm
Lead Channel Pacing Threshold Amplitude: 0.625 V
Lead Channel Pacing Threshold Pulse Width: 0.4 ms
Lead Channel Sensing Intrinsic Amplitude: 1.875 mV
Lead Channel Sensing Intrinsic Amplitude: 1.875 mV
Lead Channel Sensing Intrinsic Amplitude: 10.125 mV
Lead Channel Setting Pacing Amplitude: 2.5 V
MDC IDC LEAD LOCATION: 753860
MDC IDC MSMT LEADCHNL RA PACING THRESHOLD AMPLITUDE: 1 V
MDC IDC MSMT LEADCHNL RA PACING THRESHOLD PULSEWIDTH: 0.4 ms
MDC IDC MSMT LEADCHNL RV IMPEDANCE VALUE: 513 Ohm
MDC IDC MSMT LEADCHNL RV SENSING INTR AMPL: 10.125 mV
MDC IDC SET LEADCHNL RA PACING AMPLITUDE: 2 V
MDC IDC SET LEADCHNL RV PACING PULSEWIDTH: 0.4 ms
MDC IDC SET LEADCHNL RV SENSING SENSITIVITY: 1.2 mV
MDC IDC STAT BRADY AP VP PERCENT: 0.03 %
MDC IDC STAT BRADY RA PERCENT PACED: 68.71 %

## 2017-05-11 NOTE — Progress Notes (Signed)
InPatient ID: Hailey Simmons, female   DOB: [birth year], 81 y.o.   MRN: [id number] Cardiology Office Note  Date:  05/13/2017   ID:  Hailey Simmons, DOB 1936/03/07, MRN [id number]  PCP:  Hailey Aus, MD   Chief Complaint  Patient presents with  . other    12 month follow up. Patient denies chest pain and SOB. Mecds reviewed verbally with patient.     HPI:  Hailey Simmons is a very pleasant 81 year old woman with history of hypothyroidism,  hyperlipidemia ,  paroxysmal atrial fibrillation January 2013 Pacer for sinus node dysfunction Echocardiogram showed normal ejection fraction On diltiazem and flecainide CHA2DS2-VASc Score 4 She presents today for follow-up of her atrial fibrillation  In follow up today, she has no tachycardia or symptoms concerning for arrhythmia Pacer download reviewed showing minimal episodes of AT/AF Typically she can feel them when they happen  Lab work reviewed with her in detail Lipid Panel  Total chol 142, LDL 65  Chronic neck ache Stress test ordered by Dr. Caryl Simmons  No ischemia 07/23/2016  Recommended by primary care to move her losartan to the evening  Muscles in the neck are very tender to palpation, spasms on the left greater than the right Poor posture, kyphosis Has never seen chiropractor before  EKG on today's visit shows normal sinus rhythm with rate 67 bpm, right bundle branch block  Other past medical history  periodic urinary tract infections  She is typically very active, takes care of 3 grandchildren several days per week.   short episode of atrial fibrillation in January and possibly April 2014, lasting less than 30 minutes. atrial fibrillation June 3 in 05/23/2014. First episode was 4 hours, second episode lasted much longer.   strong family history of coronary artery disease, father 29 mg twice a day of heart attack at age 79, mother at age 79 both from cardiac disease. Tolerating Lipitor 10 mg daily  PMH:   has a past  medical history of Asthma (1970's - 1990's); Basal cell carcinoma of skin of nose; Fibrocystic breast disease; Goiter; Hypertension; Hypothyroidism; Menopause; PAF (paroxysmal atrial fibrillation) ([place]); Sinus node dysfunction ([place]); and Small bowel obstruction ([place]) (08/2009).  PSH:    Past Surgical History:  Procedure Laterality Date  . EP IMPLANTABLE DEVICE N/A 06/22/2015   MDT MRI compatible dual chamber PPM implanted by Dr Hailey Simmons for symptomatic bradycardia    Current Outpatient Prescriptions  Medication Sig Dispense Refill  . apixaban (ELIQUIS) 5 MG TABS tablet Take 1 tablet (5 mg total) by mouth 2 (two) times daily. Resume 06/26/15 60 tablet 6  . atorvastatin (LIPITOR) 10 MG tablet TAKE ONE (1) TABLET EACH DAY 90 tablet 3  . calcium carbonate (OS-CAL) 600 MG TABS Take 600 mg by mouth 2 (two) times daily with a meal.    . Cholecalciferol (VITAMIN D3) 2000 UNITS TABS Take 1 tablet by mouth daily.    . Cranberry 1000 MG CAPS Take 1 capsule by mouth daily.     Marland Kitchen diltiazem (CARDIZEM) 30 MG tablet Take 1 tablet (30 mg total) by mouth 4 (four) times daily as needed. 120 tablet 6  . diltiazem (DILACOR XR) 180 MG 24 hr capsule Take 180 mg by mouth daily as needed. For  afib per patient    . ESTRACE VAGINAL 0.1 MG/GM vaginal cream Place 1 Applicatorful vaginally as needed.     . flecainide (TAMBOCOR) 100 MG tablet TAKE ONE TABLET TWICE DAILY 180 tablet 3  . hydrochlorothiazide (MICROZIDE) 12.5  MG capsule Take one tablet by mouth daily as needed 90 capsule 3  . levothyroxine (SYNTHROID, LEVOTHROID) 100 MCG tablet Take 100 mcg by mouth daily.    Marland Kitchen losartan (COZAAR) 100 MG tablet Take 100 mg by mouth daily.     No current facility-administered medications for this visit.      Allergies:   Patient has no known allergies.   Social History:  The patient  reports that she has never smoked. She has never used smokeless tobacco. She reports that she does not drink alcohol or use drugs.   Family  History:   family history includes Heart attack in her father; Heart failure in her mother.    Review of Systems: Review of Systems  Constitutional: Negative.   Respiratory: Negative.   Cardiovascular: Negative.   Gastrointestinal: Negative.   Musculoskeletal: Positive for neck pain.  Neurological: Negative.   Psychiatric/Behavioral: Negative.   All other systems reviewed and are negative.    PHYSICAL EXAM: VS:  BP 140/60 (BP Location: Left Arm, Patient Position: Sitting, Cuff Size: Normal)   Pulse 67   Ht 5\' 4"  (1.626 m)   Wt 170 lb 8 oz (77.3 kg)   BMI 29.27 kg/m  , BMI Body mass index is 29.27 kg/m. GEN: Well nourished, well developed, in no acute distress  HEENT: normal  Neck: no JVD, carotid bruits, or masses,  head is very forward, paravertebral muscles are firm, tender Cardiac: RRR; no murmurs, rubs, or gallops,no edema  Respiratory:  clear to auscultation bilaterally, normal work of breathing GI: soft, nontender, nondistended, + BS MS: no deformity or atrophy  Skin: warm and dry, no rash Neuro:  Strength and sensation are intact Psych: euthymic mood, full affect    Recent Labs: 07/10/2016: BUN 18; Creatinine, Ser 0.79; Hemoglobin 12.5; Potassium 4.9; Sodium 137; TSH 0.168    Lipid Panel No results found for: CHOL, HDL, LDLCALC, TRIG    Wt Readings from Last 3 Encounters:  05/13/17 170 lb 8 oz (77.3 kg)  07/10/16 175 lb 12 oz (79.7 kg)  05/16/16 173 lb (78.5 kg)       ASSESSMENT AND PLAN:  PAF (paroxysmal atrial fibrillation) (HCC) - No significant arrhythmia on pacer download, details reviewed with her No medication changes made  Sinus node dysfunction ([place]) Tolerating pacemaker, followed by Dr. Caryl Simmons  Hyperlipidemia Currently on Lipitor Cholesterol is at goal on the current lipid regimen. No changes to the medications were made.  Neck pain Recommended icing, stretching exercises, massage For severe symptoms consider chiropractor   Total  encounter time more than 25 minutes  Greater than 50% was spent in counseling and coordination of care with the patient  Disposition:   F/U  12 months   Orders Placed This Encounter  Procedures  . EKG 12-Lead    Signed, Esmond Plants, M.D., Ph.D. 05/13/2017  [place], [place]

## 2017-05-13 ENCOUNTER — Encounter: Payer: Self-pay | Admitting: Cardiovascular Disease

## 2017-05-13 ENCOUNTER — Ambulatory Visit (INDEPENDENT_AMBULATORY_CARE_PROVIDER_SITE_OTHER): Payer: PPO | Admitting: Cardiovascular Disease

## 2017-05-13 VITALS — BP 140/60 | HR 67 | Ht 64.0 in | Wt 170.5 lb

## 2017-05-13 DIAGNOSIS — E782 Mixed hyperlipidemia: Secondary | ICD-10-CM

## 2017-05-13 DIAGNOSIS — I495 Sick sinus syndrome: Secondary | ICD-10-CM

## 2017-05-13 DIAGNOSIS — I480 Paroxysmal atrial fibrillation: Secondary | ICD-10-CM

## 2017-05-13 DIAGNOSIS — M542 Cervicalgia: Secondary | ICD-10-CM | POA: Diagnosis not present

## 2017-05-13 MED ORDER — DILTIAZEM HCL ER 180 MG PO CP24: 180.0000 mg | ORAL_CAPSULE | Freq: Every day | ORAL | 3 refills | Status: DC

## 2017-05-13 NOTE — Patient Instructions (Signed)
Look up treatment for : ParaVertebral muscle spasms   Medication Instructions:   No medication changes made  Labwork:  No new labs needed  Testing/Procedures:  No further testing at this time   I recommend watching educational videos on topics of interest to you at:       [email or web address]  Enter code: [email or web address]    Follow-Up: It was a pleasure seeing you in the office today. Please call us if you have new issues that need to be addressed before your next appt.  [phone]  Your physician wants you to follow-up in: 12 months.  You will receive a reminder letter in the mail two months in advance. If you don't receive a letter, please call our office to schedule the follow-up appointment.  If you need a refill on your cardiac medications before your next appointment, please call your pharmacy.

## 2017-06-12 ENCOUNTER — Encounter: Payer: Self-pay | Admitting: Cardiovascular Disease

## 2017-06-12 NOTE — Telephone Encounter (Signed)
Medication Samples have been provided to the patient.  Drug name: Eliquis       Strength: 5 mg        Qty: 4 boxes  LOT: [id number]  Exp.Date: Oct 20  Samples provided to patient.

## 2017-07-08 ENCOUNTER — Telehealth: Payer: Self-pay | Admitting: Internal Medicine

## 2017-07-08 ENCOUNTER — Encounter: Payer: Self-pay | Admitting: Cardiovascular Disease

## 2017-07-08 NOTE — Telephone Encounter (Signed)
Samples of Eliquis were left for the patient, quantity 4 boxes, Lot Number [id number] Exp: 12/20 She will pick up tomorrow during appt with Dr. Caryl Comes.

## 2017-07-09 ENCOUNTER — Ambulatory Visit (INDEPENDENT_AMBULATORY_CARE_PROVIDER_SITE_OTHER): Payer: PPO | Admitting: Internal Medicine

## 2017-07-09 ENCOUNTER — Encounter: Payer: Self-pay | Admitting: Internal Medicine

## 2017-07-09 VITALS — BP 110/56 | HR 71 | Ht 64.0 in | Wt 165.0 lb

## 2017-07-09 DIAGNOSIS — Z950 Presence of cardiac pacemaker: Secondary | ICD-10-CM

## 2017-07-09 DIAGNOSIS — I495 Sick sinus syndrome: Secondary | ICD-10-CM | POA: Diagnosis not present

## 2017-07-09 DIAGNOSIS — I480 Paroxysmal atrial fibrillation: Secondary | ICD-10-CM

## 2017-07-09 LAB — CUP PACEART INCLINIC DEVICE CHECK
Battery Remaining Longevity: 98 mo
Battery Voltage: 3.02 V
Brady Statistic AS VP Percent: 0.02 %
Brady Statistic AS VS Percent: 28.65 %
Brady Statistic RA Percent Paced: 71.3 %
Date Time Interrogation Session: 20180807144826
Implantable Lead Implant Date: 20160720
Implantable Lead Location: 753859
Implantable Lead Model: 5076
Implantable Pulse Generator Implant Date: 20160720
Lead Channel Impedance Value: 380 Ohm
Lead Channel Pacing Threshold Amplitude: 0.75 V
Lead Channel Pacing Threshold Amplitude: 1 V
Lead Channel Pacing Threshold Pulse Width: 0.4 ms
Lead Channel Sensing Intrinsic Amplitude: 2.125 mV
Lead Channel Setting Pacing Amplitude: 2.5 V
MDC IDC LEAD IMPLANT DT: 20160720
MDC IDC LEAD LOCATION: 753860
MDC IDC MSMT LEADCHNL RA IMPEDANCE VALUE: 532 Ohm
MDC IDC MSMT LEADCHNL RA PACING THRESHOLD PULSEWIDTH: 0.4 ms
MDC IDC MSMT LEADCHNL RV IMPEDANCE VALUE: 513 Ohm
MDC IDC MSMT LEADCHNL RV IMPEDANCE VALUE: 532 Ohm
MDC IDC MSMT LEADCHNL RV SENSING INTR AMPL: 9.25 mV
MDC IDC SET LEADCHNL RA PACING AMPLITUDE: 2 V
MDC IDC SET LEADCHNL RV PACING PULSEWIDTH: 0.4 ms
MDC IDC SET LEADCHNL RV SENSING SENSITIVITY: 1.2 mV
MDC IDC STAT BRADY AP VP PERCENT: 0.04 %
MDC IDC STAT BRADY AP VS PERCENT: 71.29 %
MDC IDC STAT BRADY RV PERCENT PACED: 0.05 %

## 2017-07-09 NOTE — Patient Instructions (Signed)
Medication Instructions: - Your physician recommends that you continue on your current medications as directed. Please refer to the Current Medication list given to you today  Labwork: - Your physician recommends that you have lab work today: BMP/ CBC  Procedures/Testing: - none ordered  Follow-Up: - Remote monitoring is used to monitor your Pacemaker of ICD from home. This monitoring reduces the number of office visits required to check your device to one time per year. It allows [place] to keep an eye on the functioning of your device to ensure it is working properly. You are scheduled for a device check from home on 10/08/17. You may send your transmission at any time that day. If you have a wireless device, the transmission will be sent automatically. After your physician reviews your transmission, you will receive a postcard with your next transmission date.  - Your physician wants you to follow-up in: 1 year with Dr. Caryl Comes. You will receive a reminder letter in the mail two months in advance. If you don't receive a letter, please call our office to schedule the follow-up appointment.   Any Additional Special Instructions Will Be Listed Below (If Applicable).     If you need a refill on your cardiac medications before your next appointment, please call your pharmacy.

## 2017-07-09 NOTE — Progress Notes (Signed)
Patient Care Team: Rusty Aus, MD as PCP - General (Unknown Physician Specialty) [name], MD as Consulting Physician (Cardiology)   HPI  Hailey Simmons is a 81 y.o. female Seen in followup for pacer implanted for sinus node dysfunction and PAF  This patients CHA2DS2-VASc Score and unadjusted Ischemic Stroke Rate (% per year) is equal to 4.8 % stroke rate/year from a score of 4  She is tolerating her medications well without GI symptoms or bleeding.   The patient denies chest pain, shortness of breath, nocturnal dyspnea, orthopnea or peripheral edema.  There have been no palpitations, lightheadedness or syncope.  She and her husband continue to care for their three grandchildren      DATE TEST    2/13    Echo   EF 60 %   8/17    Myoview   EF 55-60 % No Ischemia        Date Cr Hgb  2/18 0.9 12.6          DATE PR duration QRSduration Dose-flecianide  3/13 160 134 0  7/15  158 50  10/16 180 154 100  6/17`  146 100  7/18 (A paced) 280 140 100          Past Medical History:  Diagnosis Date  . Asthma 1970's - 1990's   a. ? 2/2 wood stove usage.  . Basal cell carcinoma of skin of nose   . Fibrocystic breast disease   . Goiter   . Hypertension   . Hypothyroidism   . Menopause   . PAF (paroxysmal atrial fibrillation) ([place])    on apixoban  . Sinus node dysfunction (HCC)    a. s/p MDT dual chamber pacemaker  . Small bowel obstruction ([place]) 08/2009    Past Surgical History:  Procedure Laterality Date  . EP IMPLANTABLE DEVICE N/A 06/22/2015   MDT MRI compatible dual chamber PPM implanted by Dr Caryl Comes for symptomatic bradycardia    Current Outpatient Prescriptions  Medication Sig Dispense Refill  . apixaban (ELIQUIS) 5 MG TABS tablet Take 1 tablet (5 mg total) by mouth 2 (two) times daily. Resume 06/26/15 60 tablet 6  . atorvastatin (LIPITOR) 10 MG tablet TAKE ONE (1) TABLET EACH DAY 90 tablet 3  . calcium carbonate (OS-CAL) 600 MG TABS Take 600  mg by mouth 2 (two) times daily with a meal.    . Cholecalciferol (VITAMIN D3) 2000 UNITS TABS Take 1 tablet by mouth daily.    . Cranberry 1000 MG CAPS Take 1 capsule by mouth daily.     Marland Kitchen diltiazem (CARDIZEM) 30 MG tablet Take 1 tablet (30 mg total) by mouth 4 (four) times daily as needed. 120 tablet 6  . diltiazem (DILACOR XR) 180 MG 24 hr capsule Take 1 capsule (180 mg total) by mouth daily. For  afib per patient 90 capsule 3  . ESTRACE VAGINAL 0.1 MG/GM vaginal cream Place 1 Applicatorful vaginally as needed.     . flecainide (TAMBOCOR) 100 MG tablet TAKE ONE TABLET TWICE DAILY 180 tablet 3  . hydrochlorothiazide (MICROZIDE) 12.5 MG capsule Take one tablet by mouth daily as needed 90 capsule 3  . levothyroxine (SYNTHROID, LEVOTHROID) 100 MCG tablet Take 100 mcg by mouth daily.    Marland Kitchen losartan (COZAAR) 100 MG tablet Take 100 mg by mouth daily.     No current facility-administered medications for this visit.     No Known Allergies    Review of Systems  negative except from HPI and PMH  Physical Exam BP (!) 110/56 (BP Location: Left Arm, Patient Position: Sitting, Cuff Size: Normal)   Pulse 71   Ht 5\' 4"  (1.626 m)   Wt 165 lb (74.8 kg)   BMI 28.32 kg/m  Well developed and well nourished in no acute distress HENT normal E scleral and icterus clear Neck Supple JVP flat; carotids brisk and full Clear to ausculation Device pocket well healed; without hematoma or erythema.  There is no tethering  Regular rate and rhythm, no murmurs gallops or rub Soft with active bowel sounds No clubbing cyanosis  Edema Alert and oriented, grossly normal motor and sensory function Skin Warm and Dry  ECG demonstrates atrial pacing at 68 Intervals 20/15/45 Right bundle branch block  Assessment and  Plan  Atrial fibrillation-paroxysmal  Sinus node dysfunction  Pacemaker-Medtronic  The patient's device was interrogated.  The information was reviewed. No changes were made in the programming.       Right bundle branch block  Hypertension     We will continue her on anticoagulation  No bleeding    She is tolerating her 1C agent in the context of her modest QRS duration prolongation; PR interval longer but this with A pacing   BP well controlled

## 2017-07-10 LAB — CBC WITH DIFFERENTIAL/PLATELET
Basophils Absolute: 0 10*3/uL (ref 0.0–0.2)
Basos: 0 %
EOS (ABSOLUTE): 0.2 10*3/uL (ref 0.0–0.4)
EOS: 3 %
HEMATOCRIT: 38.3 % (ref 34.0–46.6)
HEMOGLOBIN: 12.5 g/dL (ref 11.1–15.9)
IMMATURE GRANULOCYTES: 0 %
Immature Grans (Abs): 0 10*3/uL (ref 0.0–0.1)
Lymphocytes Absolute: 1.5 10*3/uL (ref 0.7–3.1)
Lymphs: 20 %
MCH: 28.5 pg (ref 26.6–33.0)
MCHC: 32.6 g/dL (ref 31.5–35.7)
MCV: 87 fL (ref 79–97)
MONOCYTES: 6 %
Monocytes Absolute: 0.5 10*3/uL (ref 0.1–0.9)
NEUTROS PCT: 71 %
Neutrophils Absolute: 5.5 10*3/uL (ref 1.4–7.0)
Platelets: 158 10*3/uL (ref 150–379)
RBC: 4.38 x10E6/uL (ref 3.77–5.28)
RDW: 12.9 % (ref 12.3–15.4)
WBC: 7.8 10*3/uL (ref 3.4–10.8)

## 2017-07-10 LAB — BASIC METABOLIC PANEL
BUN/Creatinine Ratio: 19 (ref 12–28)
BUN: 14 mg/dL (ref 8–27)
CALCIUM: 9.6 mg/dL (ref 8.7–10.3)
CO2: 23 mmol/L (ref 20–29)
CREATININE: 0.75 mg/dL (ref 0.57–1.00)
Chloride: 98 mmol/L (ref 96–106)
GFR calc Af Amer: 88 mL/min/{1.73_m2} (ref >59)
GFR calc non Af Amer: 77 mL/min/{1.73_m2} (ref >59)
GLUCOSE: 90 mg/dL (ref 65–99)
Potassium: 4.6 mmol/L (ref 3.5–5.2)
Sodium: 137 mmol/L (ref 134–144)

## 2017-08-08 ENCOUNTER — Encounter: Payer: Self-pay | Admitting: Cardiovascular Disease

## 2017-09-05 DIAGNOSIS — L900 Lichen sclerosus et atrophicus: Secondary | ICD-10-CM | POA: Diagnosis not present

## 2017-09-05 DIAGNOSIS — L821 Other seborrheic keratosis: Secondary | ICD-10-CM | POA: Diagnosis not present

## 2017-09-11 ENCOUNTER — Encounter: Payer: Self-pay | Admitting: Cardiovascular Disease

## 2017-09-11 NOTE — Telephone Encounter (Signed)
Medication Samples have been provided to the patient.  Drug name: Eliquis       Strength: 5 mg        Qty: 2 boxes  LOT: [id number]  Exp.Date: Jan 2021  Samples placed up front for her to pick up.

## 2017-09-24 ENCOUNTER — Encounter: Payer: Self-pay | Admitting: Cardiovascular Disease

## 2017-09-25 ENCOUNTER — Encounter: Payer: Self-pay | Admitting: Cardiovascular Disease

## 2017-09-26 ENCOUNTER — Encounter: Payer: Self-pay | Admitting: Cardiovascular Disease

## 2017-09-26 NOTE — Telephone Encounter (Signed)
Medication Samples have been placed up front for patient to pick up.   Drug name: Eliquis       Strength: 5 mg        Qty: 2 boxes  LOT: [id number]  Exp.Date: 11/20

## 2017-10-07 ENCOUNTER — Encounter: Payer: Self-pay | Admitting: Cardiovascular Disease

## 2017-10-08 ENCOUNTER — Ambulatory Visit (INDEPENDENT_AMBULATORY_CARE_PROVIDER_SITE_OTHER): Payer: PPO | Admitting: *Deleted

## 2017-10-08 ENCOUNTER — Telehealth: Payer: Self-pay | Admitting: Cardiology

## 2017-10-08 DIAGNOSIS — I495 Sick sinus syndrome: Secondary | ICD-10-CM | POA: Diagnosis not present

## 2017-10-08 NOTE — Telephone Encounter (Signed)
LMOVM reminding pt to send remote transmission.   

## 2017-10-09 NOTE — Progress Notes (Signed)
Remote pacemaker transmission.   

## 2017-10-10 ENCOUNTER — Encounter: Payer: Self-pay | Admitting: Cardiology

## 2017-10-12 LAB — CUP PACEART REMOTE DEVICE CHECK
Brady Statistic AP VS Percent: 75.87 %
Brady Statistic AS VP Percent: 0.02 %
Brady Statistic RV Percent Paced: 0.06 %
Date Time Interrogation Session: 20181106214248
Implantable Lead Location: 753859
Implantable Lead Model: 5076
Implantable Lead Model: 5076
Lead Channel Impedance Value: 475 Ohm
Lead Channel Sensing Intrinsic Amplitude: 1.375 mV
Lead Channel Sensing Intrinsic Amplitude: 8.5 mV
Lead Channel Sensing Intrinsic Amplitude: 8.5 mV
Lead Channel Setting Pacing Amplitude: 2.25 V
Lead Channel Setting Pacing Amplitude: 2.5 V
Lead Channel Setting Pacing Pulse Width: 0.4 ms
MDC IDC LEAD IMPLANT DT: 20160720
MDC IDC LEAD IMPLANT DT: 20160720
MDC IDC LEAD LOCATION: 753860
MDC IDC MSMT BATTERY REMAINING LONGEVITY: 97 mo
MDC IDC MSMT BATTERY VOLTAGE: 3.02 V
MDC IDC MSMT LEADCHNL RA IMPEDANCE VALUE: 342 Ohm
MDC IDC MSMT LEADCHNL RA IMPEDANCE VALUE: 456 Ohm
MDC IDC MSMT LEADCHNL RA PACING THRESHOLD AMPLITUDE: 1 V
MDC IDC MSMT LEADCHNL RA PACING THRESHOLD PULSEWIDTH: 0.4 ms
MDC IDC MSMT LEADCHNL RA SENSING INTR AMPL: 1.375 mV
MDC IDC MSMT LEADCHNL RV IMPEDANCE VALUE: 513 Ohm
MDC IDC MSMT LEADCHNL RV PACING THRESHOLD AMPLITUDE: 0.625 V
MDC IDC MSMT LEADCHNL RV PACING THRESHOLD PULSEWIDTH: 0.4 ms
MDC IDC PG IMPLANT DT: 20160720
MDC IDC SET LEADCHNL RV SENSING SENSITIVITY: 1.2 mV
MDC IDC STAT BRADY AP VP PERCENT: 0.04 %
MDC IDC STAT BRADY AS VS PERCENT: 24.07 %
MDC IDC STAT BRADY RA PERCENT PACED: 75.85 %

## 2017-11-21 ENCOUNTER — Encounter: Payer: Self-pay | Admitting: Cardiovascular Disease

## 2017-11-22 ENCOUNTER — Encounter: Payer: Self-pay | Admitting: Cardiovascular Disease

## 2017-11-23 ENCOUNTER — Encounter: Payer: Self-pay | Admitting: Cardiovascular Disease

## 2018-01-02 DIAGNOSIS — L900 Lichen sclerosus et atrophicus: Secondary | ICD-10-CM | POA: Diagnosis not present

## 2018-01-02 DIAGNOSIS — L853 Xerosis cutis: Secondary | ICD-10-CM | POA: Diagnosis not present

## 2018-01-02 DIAGNOSIS — Z85828 Personal history of other malignant neoplasm of skin: Secondary | ICD-10-CM | POA: Diagnosis not present

## 2018-01-02 DIAGNOSIS — D1801 Hemangioma of skin and subcutaneous tissue: Secondary | ICD-10-CM | POA: Diagnosis not present

## 2018-01-07 ENCOUNTER — Ambulatory Visit (INDEPENDENT_AMBULATORY_CARE_PROVIDER_SITE_OTHER): Payer: PPO | Admitting: *Deleted

## 2018-01-07 ENCOUNTER — Telehealth: Payer: Self-pay | Admitting: Cardiology

## 2018-01-07 DIAGNOSIS — I495 Sick sinus syndrome: Secondary | ICD-10-CM | POA: Diagnosis not present

## 2018-01-07 NOTE — Telephone Encounter (Signed)
Spoke with pt and reminded pt of remote transmission that is due today. Pt verbalized understanding.   

## 2018-01-07 NOTE — Progress Notes (Signed)
Remote pacemaker transmission.   

## 2018-01-08 ENCOUNTER — Encounter: Payer: Self-pay | Admitting: Cardiology

## 2018-01-14 DIAGNOSIS — I480 Paroxysmal atrial fibrillation: Secondary | ICD-10-CM | POA: Diagnosis not present

## 2018-01-21 DIAGNOSIS — I480 Paroxysmal atrial fibrillation: Secondary | ICD-10-CM | POA: Diagnosis not present

## 2018-01-21 DIAGNOSIS — E039 Hypothyroidism, unspecified: Secondary | ICD-10-CM | POA: Diagnosis not present

## 2018-01-21 DIAGNOSIS — Z0000 Encounter for general adult medical examination without abnormal findings: Secondary | ICD-10-CM | POA: Diagnosis not present

## 2018-01-21 DIAGNOSIS — E782 Mixed hyperlipidemia: Secondary | ICD-10-CM | POA: Diagnosis not present

## 2018-01-21 DIAGNOSIS — R739 Hyperglycemia, unspecified: Secondary | ICD-10-CM | POA: Diagnosis not present

## 2018-01-29 LAB — CUP PACEART REMOTE DEVICE CHECK
Battery Remaining Longevity: 95 mo
Brady Statistic AP VS Percent: 63.83 %
Brady Statistic AS VS Percent: 36.08 %
Date Time Interrogation Session: 20190205195425
Implantable Lead Implant Date: 20160720
Implantable Lead Location: 753859
Lead Channel Impedance Value: 475 Ohm
Lead Channel Pacing Threshold Amplitude: 0.625 V
Lead Channel Sensing Intrinsic Amplitude: 2 mV
Lead Channel Sensing Intrinsic Amplitude: 2 mV
Lead Channel Sensing Intrinsic Amplitude: 9.125 mV
Lead Channel Setting Pacing Amplitude: 2.5 V
MDC IDC LEAD IMPLANT DT: 20160720
MDC IDC LEAD LOCATION: 753860
MDC IDC MSMT BATTERY VOLTAGE: 3.02 V
MDC IDC MSMT LEADCHNL RA IMPEDANCE VALUE: 342 Ohm
MDC IDC MSMT LEADCHNL RA PACING THRESHOLD AMPLITUDE: 1.125 V
MDC IDC MSMT LEADCHNL RA PACING THRESHOLD PULSEWIDTH: 0.4 ms
MDC IDC MSMT LEADCHNL RV IMPEDANCE VALUE: 494 Ohm
MDC IDC MSMT LEADCHNL RV IMPEDANCE VALUE: 513 Ohm
MDC IDC MSMT LEADCHNL RV PACING THRESHOLD PULSEWIDTH: 0.4 ms
MDC IDC MSMT LEADCHNL RV SENSING INTR AMPL: 9.125 mV
MDC IDC PG IMPLANT DT: 20160720
MDC IDC SET LEADCHNL RA PACING AMPLITUDE: 2.25 V
MDC IDC SET LEADCHNL RV PACING PULSEWIDTH: 0.4 ms
MDC IDC SET LEADCHNL RV SENSING SENSITIVITY: 1.2 mV
MDC IDC STAT BRADY AP VP PERCENT: 0.03 %
MDC IDC STAT BRADY AS VP PERCENT: 0.06 %
MDC IDC STAT BRADY RA PERCENT PACED: 63.48 %
MDC IDC STAT BRADY RV PERCENT PACED: 0.09 %

## 2018-02-18 ENCOUNTER — Encounter: Payer: Self-pay | Admitting: Cardiovascular Disease

## 2018-04-08 ENCOUNTER — Ambulatory Visit (INDEPENDENT_AMBULATORY_CARE_PROVIDER_SITE_OTHER): Payer: PPO | Admitting: *Deleted

## 2018-04-08 ENCOUNTER — Telehealth: Payer: Self-pay | Admitting: Cardiology

## 2018-04-08 DIAGNOSIS — I495 Sick sinus syndrome: Secondary | ICD-10-CM

## 2018-04-08 NOTE — Telephone Encounter (Signed)
LMOVM reminding pt to send remote transmission.   

## 2018-04-09 NOTE — Progress Notes (Signed)
Remote pacemaker transmission.   

## 2018-04-10 ENCOUNTER — Encounter: Payer: Self-pay | Admitting: Cardiology

## 2018-04-23 DIAGNOSIS — H25813 Combined forms of age-related cataract, bilateral: Secondary | ICD-10-CM | POA: Diagnosis not present

## 2018-04-25 LAB — CUP PACEART REMOTE DEVICE CHECK
Battery Remaining Longevity: 94 mo
Battery Voltage: 3.02 V
Brady Statistic AS VS Percent: 32.88 %
Implantable Lead Implant Date: 20160720
Implantable Lead Implant Date: 20160720
Implantable Lead Location: 753859
Implantable Lead Model: 5076
Implantable Pulse Generator Implant Date: 20160720
Lead Channel Impedance Value: 342 Ohm
Lead Channel Impedance Value: 456 Ohm
Lead Channel Impedance Value: 513 Ohm
Lead Channel Pacing Threshold Amplitude: 0.625 V
Lead Channel Pacing Threshold Amplitude: 1.125 V
Lead Channel Pacing Threshold Pulse Width: 0.4 ms
Lead Channel Sensing Intrinsic Amplitude: 2 mV
Lead Channel Sensing Intrinsic Amplitude: 2 mV
Lead Channel Setting Pacing Pulse Width: 0.4 ms
MDC IDC LEAD LOCATION: 753860
MDC IDC MSMT LEADCHNL RA PACING THRESHOLD PULSEWIDTH: 0.4 ms
MDC IDC MSMT LEADCHNL RV IMPEDANCE VALUE: 494 Ohm
MDC IDC MSMT LEADCHNL RV SENSING INTR AMPL: 8.375 mV
MDC IDC MSMT LEADCHNL RV SENSING INTR AMPL: 8.375 mV
MDC IDC SESS DTM: 20190507195400
MDC IDC SET LEADCHNL RA PACING AMPLITUDE: 2.25 V
MDC IDC SET LEADCHNL RV PACING AMPLITUDE: 2.5 V
MDC IDC SET LEADCHNL RV SENSING SENSITIVITY: 1.2 mV
MDC IDC STAT BRADY AP VP PERCENT: 0.03 %
MDC IDC STAT BRADY AP VS PERCENT: 67.07 %
MDC IDC STAT BRADY AS VP PERCENT: 0.02 %
MDC IDC STAT BRADY RA PERCENT PACED: 66.64 %
MDC IDC STAT BRADY RV PERCENT PACED: 0.05 %

## 2018-05-13 ENCOUNTER — Other Ambulatory Visit: Payer: Self-pay | Admitting: Cardiovascular Disease

## 2018-05-20 ENCOUNTER — Encounter: Payer: Self-pay | Admitting: Cardiovascular Disease

## 2018-05-28 ENCOUNTER — Other Ambulatory Visit: Payer: Self-pay | Admitting: Internal Medicine

## 2018-05-28 DIAGNOSIS — Z1231 Encounter for screening mammogram for malignant neoplasm of breast: Secondary | ICD-10-CM

## 2018-06-10 NOTE — Progress Notes (Signed)
InPatient ID: Hailey Simmons, female   DOB: 03/07/1936, 82 y.o.   MRN: [id number] Cardiology Office Note  Date:  06/12/2018   ID:  Hailey Simmons, DOB 15-Aug-1936, MRN [id number]  PCP:  Rusty Aus, MD   Chief Complaint  Patient presents with  . other    12 month follow up. Meds reviewed by the pt. verbally. "doing well."     HPI:  Hailey Simmons is a very pleasant 82 year old woman with history of hypothyroidism,  hyperlipidemia ,  paroxysmal atrial fibrillation January 2013 Pacer for sinus node dysfunction Echocardiogram showed normal ejection fraction On diltiazem and flecainide CHA2DS2-VASc Score 4 She presents today for follow-up of her atrial fibrillation  Most recent pacer download reviewed showing minimal atrial tachycardia atrial fibrillation Less than 0.1%  she does not appreciate any Tachycardia or palpitations  Very active with her grandchildren No regular exercise program  Lipid Panel  Total chol 142, LDL 65 Mildly elevated alkaline phosphatase chronic stable  Denies any chest pain or shortness of breath Medication list has HCTZ 12.5 as needed She thinks she is taking this every day Blood pressure well controlled Stable electrolytes  Chronic neck ache  Stress test  No ischemia 07/23/2016  EKG personally reviewed by myself on todays visit Shows normal sinus rhythm rate 65 bpm right bundle branch block  Other past medical history  periodic urinary tract infections  She is typically very active, takes care of 3 grandchildren several days per week.   short episode of atrial fibrillation in January and possibly April 2014, lasting less than 30 minutes. atrial fibrillation June 3 in 05/23/2014. First episode was 4 hours, second episode lasted much longer.   strong family history of coronary artery disease, father 80 mg twice a day of heart attack at age 54, mother at age 68 both from cardiac disease. Tolerating Lipitor 10 mg daily  PMH:   has a past  medical history of Asthma (1970's - 1990's), Basal cell carcinoma of skin of nose, Fibrocystic breast disease, Goiter, Hypertension, Hypothyroidism, Menopause, PAF (paroxysmal atrial fibrillation) ([place]), Sinus node dysfunction ([place]), and Small bowel obstruction ([place]) (08/2009).  PSH:    Past Surgical History:  Procedure Laterality Date  . EP IMPLANTABLE DEVICE N/A 06/22/2015   MDT MRI compatible dual chamber PPM implanted by Dr Caryl Comes for symptomatic bradycardia    Current Outpatient Medications  Medication Sig Dispense Refill  . apixaban (ELIQUIS) 5 MG TABS tablet Take 1 tablet (5 mg total) by mouth 2 (two) times daily. Resume 06/26/15 60 tablet 6  . atorvastatin (LIPITOR) 10 MG tablet TAKE ONE (1) TABLET EACH DAY 90 tablet 3  . calcium carbonate (OS-CAL) 600 MG TABS Take 600 mg by mouth 2 (two) times daily with a meal.    . Cholecalciferol (VITAMIN D3) 2000 UNITS TABS Take 1 tablet by mouth daily.    . Cranberry 1000 MG CAPS Take 1 capsule by mouth daily.     Marland Kitchen diltiazem (CARDIZEM) 30 MG tablet Take 1 tablet (30 mg total) by mouth 4 (four) times daily as needed. 120 tablet 6  . ESTRACE VAGINAL 0.1 MG/GM vaginal cream Place 1 Applicatorful vaginally as needed.     . flecainide (TAMBOCOR) 100 MG tablet TAKE ONE TABLET TWICE DAILY 180 tablet 3  . hydrochlorothiazide (MICROZIDE) 12.5 MG capsule Take one tablet by mouth daily as needed 90 capsule 3  . levothyroxine (SYNTHROID, LEVOTHROID) 100 MCG tablet Take 100 mcg by mouth daily.    Marland Kitchen losartan (COZAAR)  100 MG tablet Take 100 mg by mouth daily.    Marland Kitchen diltiazem (CARDIZEM CD) 180 MG 24 hr capsule Take 1 capsule (180 mg total) by mouth daily. 90 capsule 3   No current facility-administered medications for this visit.      Allergies:   Patient has no known allergies.   Social History:  The patient  reports that she has never smoked. She has never used smokeless tobacco. She reports that she does not drink alcohol or use drugs.   Family History:    family history includes Heart attack in her father; Heart failure in her mother.    Review of Systems: Review of Systems  Constitutional: Negative.   Respiratory: Negative.   Cardiovascular: Negative.   Gastrointestinal: Negative.   Musculoskeletal: Positive for neck pain.  Neurological: Negative.   Psychiatric/Behavioral: Negative.   All other systems reviewed and are negative.    PHYSICAL EXAM: VS:  BP 136/60 (BP Location: Left Arm, Patient Position: Sitting, Cuff Size: Normal)   Pulse 65   Ht 5\' 4"  (1.626 m)   Wt 168 lb 8 oz (76.4 kg)   BMI 28.92 kg/m  , BMI Body mass index is 28.92 kg/m. Constitutional:  oriented to person, place, and time. No distress.  HENT:  Head: Normocephalic and atraumatic.  Eyes:  no discharge. No scleral icterus.  Neck: Normal range of motion. Neck supple. No JVD present.  Cardiovascular: Normal rate, regular rhythm, normal heart sounds and intact distal pulses. Exam reveals no gallop and no friction rub. No edema No murmur heard. Pulmonary/Chest: Effort normal and breath sounds normal. No stridor. No respiratory distress.  no wheezes.  no rales.  no tenderness.  Abdominal: Soft.  no distension.  no tenderness.  Musculoskeletal: Normal range of motion.  no  tenderness or deformity.  Neurological:  normal muscle tone. Coordination normal. No atrophy Skin: Skin is warm and dry. No rash noted. not diaphoretic.  Psychiatric:  normal mood and affect. behavior is normal. Thought content normal.    Recent Labs: 07/09/2017: BUN 14; Creatinine, Ser 0.75; Hemoglobin 12.5; Platelets 158; Potassium 4.6; Sodium 137    Lipid Panel No results found for: CHOL, HDL, LDLCALC, TRIG    Wt Readings from Last 3 Encounters:  06/12/18 168 lb 8 oz (76.4 kg)  07/09/17 165 lb (74.8 kg)  05/13/17 170 lb 8 oz (77.3 kg)      ASSESSMENT AND PLAN:  PAF (paroxysmal atrial fibrillation) (HCC) - No significant arrhythmia on pacer download, details reviewed with  her No medication changes made She denies any significant symptoms overall feels well  Sinus node dysfunction ([place]) Tolerating pacemaker, followed by Dr. Caryl Comes  Hyperlipidemia Currently on Lipitor Cholesterol at goal, denies anginal symptoms Previous negative stress test  Neck pain Not an active issue on today's visit   Total encounter time more than 25 minutes  Greater than 50% was spent in counseling and coordination of care with the patient  Disposition:   F/U  12 months    No orders of the defined types were placed in this encounter.   Signed, Esmond Plants, M.D., Ph.D. 06/12/2018  [place], [place]

## 2018-06-12 ENCOUNTER — Encounter: Payer: Self-pay | Admitting: Cardiovascular Disease

## 2018-06-12 ENCOUNTER — Telehealth: Payer: Self-pay | Admitting: Cardiovascular Disease

## 2018-06-12 ENCOUNTER — Ambulatory Visit: Payer: PPO | Admitting: Cardiovascular Disease

## 2018-06-12 VITALS — BP 136/60 | HR 65 | Ht 64.0 in | Wt 168.5 lb

## 2018-06-12 DIAGNOSIS — Z950 Presence of cardiac pacemaker: Secondary | ICD-10-CM

## 2018-06-12 DIAGNOSIS — I495 Sick sinus syndrome: Secondary | ICD-10-CM | POA: Diagnosis not present

## 2018-06-12 DIAGNOSIS — I480 Paroxysmal atrial fibrillation: Secondary | ICD-10-CM | POA: Diagnosis not present

## 2018-06-12 DIAGNOSIS — M542 Cervicalgia: Secondary | ICD-10-CM | POA: Diagnosis not present

## 2018-06-12 DIAGNOSIS — E782 Mixed hyperlipidemia: Secondary | ICD-10-CM

## 2018-06-12 MED ORDER — HYDROCHLOROTHIAZIDE 12.5 MG PO CAPS: 12.5000 mg | ORAL_CAPSULE | Freq: Every day | ORAL | 3 refills | Status: DC

## 2018-06-12 MED ORDER — DILTIAZEM HCL ER COATED BEADS 180 MG PO CP24: 180.0000 mg | ORAL_CAPSULE | Freq: Every day | ORAL | 3 refills | Status: DC

## 2018-06-12 NOTE — Patient Instructions (Addendum)
Medication Instructions:   No medication changes made  Labwork:  No new labs needed  Testing/Procedures:  No further testing at this time   Follow-Up: It was a pleasure seeing you in the office today. Please call us if you have new issues that need to be addressed before your next appt.  [phone]  Your physician wants you to follow-up in: 12 months.  You will receive a reminder letter in the mail two months in advance. If you don't receive a letter, please call our office to schedule the follow-up appointment.  If you need a refill on your cardiac medications before your next appointment, please call your pharmacy.  For educational health videos Log in to : [email or web address] Or : [email or web address], password : [email or web address]    Medication Samples have been provided to the patient.  Drug name: Eliquis       Strength: 5 mg        Qty: 4 boxes  LOT: [id number]  Exp.Date: Jun 2021

## 2018-06-12 NOTE — Telephone Encounter (Signed)
Pt states she was to call and let us know about her medications   She states she's been taking the Hydrochlorothiazide 12.5 mg once a day  Please advise

## 2018-06-12 NOTE — Telephone Encounter (Signed)
Spoke with patient and pharmacy and prescription sent in for HCTZ. Patient verbalized understanding to continue current medications and to please call if any questions.

## 2018-06-12 NOTE — Telephone Encounter (Signed)
Left voicemail message that I would make Dr. Rockey Situ aware and would give her a call back.

## 2018-06-16 NOTE — Addendum Note (Signed)
Addended by: Anselm Pancoast on: 06/16/2018 11:28 AM   Modules accepted: Orders

## 2018-06-17 ENCOUNTER — Ambulatory Visit
Admission: RE | Admit: 2018-06-17 | Discharge: 2018-06-17 | Disposition: A | Discharge status: Home / Self Care | Payer: PPO | Source: Ambulatory Visit | Attending: Internal Medicine | Admitting: Internal Medicine

## 2018-06-17 DIAGNOSIS — Z1231 Encounter for screening mammogram for malignant neoplasm of breast: Secondary | ICD-10-CM | POA: Insufficient documentation

## 2018-07-08 ENCOUNTER — Encounter: Payer: PPO | Admitting: *Deleted

## 2018-07-10 ENCOUNTER — Telehealth: Payer: Self-pay | Admitting: Cardiovascular Disease

## 2018-07-10 DIAGNOSIS — R933 Abnormal findings on diagnostic imaging of other parts of digestive tract: Secondary | ICD-10-CM | POA: Diagnosis not present

## 2018-07-10 DIAGNOSIS — K5909 Other constipation: Secondary | ICD-10-CM | POA: Diagnosis not present

## 2018-07-10 NOTE — Telephone Encounter (Signed)
° °  [facility] Pre-operative Risk Assessment    Request for surgical clearance:  1. What type of surgery is being performed? Colonoscopy    2. When is this surgery scheduled? 08/26/18   3. What type of clearance is required (medical clearance vs. Pharmacy clearance to hold med vs. Both)? both  4. Are there any medications that need to be held prior to surgery and how long?Eliquis    5. Practice name and name of physician performing surgery? Dr Gustavo Lah [facility]  6. What is your office phone number [phone]   7.   What is your office fax number [phone]  8.   Anesthesia type (None, local, MAC, general)? Monitored

## 2018-07-10 NOTE — Telephone Encounter (Signed)
Routing to Dr Gollan. 

## 2018-07-11 NOTE — Telephone Encounter (Signed)
Acceptable risk for surgery She can stop the eliquis 2 to 3 days prior to GI procedure Restart when surgeon agrees thx TG

## 2018-07-14 NOTE — Telephone Encounter (Signed)
Clearance routed to surgeons office at number listed.

## 2018-07-15 ENCOUNTER — Encounter: Payer: Self-pay | Admitting: Cardiology

## 2018-07-17 ENCOUNTER — Ambulatory Visit (INDEPENDENT_AMBULATORY_CARE_PROVIDER_SITE_OTHER): Payer: PPO | Admitting: *Deleted

## 2018-07-17 DIAGNOSIS — I495 Sick sinus syndrome: Secondary | ICD-10-CM

## 2018-07-18 NOTE — Progress Notes (Signed)
Remote pacemaker transmission.   

## 2018-07-29 ENCOUNTER — Ambulatory Visit: Payer: PPO | Admitting: Internal Medicine

## 2018-07-29 ENCOUNTER — Encounter: Payer: Self-pay | Admitting: Internal Medicine

## 2018-07-29 VITALS — BP 140/70 | HR 68 | Ht 64.0 in | Wt 170.0 lb

## 2018-07-29 DIAGNOSIS — Z950 Presence of cardiac pacemaker: Secondary | ICD-10-CM

## 2018-07-29 DIAGNOSIS — I495 Sick sinus syndrome: Secondary | ICD-10-CM | POA: Diagnosis not present

## 2018-07-29 DIAGNOSIS — Z79899 Other long term (current) drug therapy: Secondary | ICD-10-CM

## 2018-07-29 DIAGNOSIS — I480 Paroxysmal atrial fibrillation: Secondary | ICD-10-CM | POA: Diagnosis not present

## 2018-07-29 NOTE — Patient Instructions (Signed)
Medication Instructions: - Your physician recommends that you continue on your current medications as directed. Please refer to the Current Medication list given to you today.  Labwork: - Your physician recommends that you have lab work today: BMP/ CBC  Procedures/Testing: - none ordered  Follow-Up: - Remote monitoring is used to monitor your Pacemaker of ICD from home. This monitoring reduces the number of office visits required to check your device to one time per year. It allows [place] to keep an eye on the functioning of your device to ensure it is working properly. You are scheduled for a device check from home on 10/26/18. You may send your transmission at any time that day. If you have a wireless device, the transmission will be sent automatically. After your physician reviews your transmission, you will receive a postcard with your next transmission date.  - Your physician wants you to follow-up in: 1 year with Dr. Caryl Comes. You will receive a reminder letter in the mail/ call two months in advance. If you don't receive a letter/ call, please call our office to schedule the follow-up appointment.   Any Additional Special Instructions Will Be Listed Below (If Applicable).     If you need a refill on your cardiac medications before your next appointment, please call your pharmacy.

## 2018-07-29 NOTE — Progress Notes (Signed)
Patient Care Team: Rusty Aus, MD as PCP - General (Unknown Physician Specialty) [name], MD as Consulting Physician (Cardiology)   HPI  Hailey Simmons is a 82 y.o. female Seen in followup for pacer implanted for sinus node dysfunction and PAF  This patients CHA2DS2-VASc Score and unadjusted Ischemic Stroke Rate (% per year) is equal to 4.8 % stroke rate/year from a score of 4  The patient denies chest pain, shortness of breath, nocturnal dyspnea, orthopnea or peripheral edema.  There have been no palpitations, lightheadedness or syncope.    Tolerating meds      DATE TEST    2/13    Echo   EF 60 %   8/17    Myoview   EF 55-60 % No Ischemia        Date Cr Hgb  2/18 0.9 12.6  8/18  0.75 12.5  2/19 0.8 12.8     DATE PR duration QRSduration Dose-flecianide  3/13 160 134 0  7/15  158 50  10/16 180 154 100  6/17`  146 100  7/18 (A paced) 280 140 100  8/19 Apaced 240 142 100      Past Medical History:  Diagnosis Date  . Asthma 1970's - 1990's   a. ? 2/2 wood stove usage.  . Basal cell carcinoma of skin of nose   . Fibrocystic breast disease   . Goiter   . Hypertension   . Hypothyroidism   . Menopause   . PAF (paroxysmal atrial fibrillation) ([place])    on apixoban  . Sinus node dysfunction (HCC)    a. s/p MDT dual chamber pacemaker  . Small bowel obstruction ([place]) 08/2009    Past Surgical History:  Procedure Laterality Date  . EP IMPLANTABLE DEVICE N/A 06/22/2015   MDT MRI compatible dual chamber PPM implanted by Dr Caryl Comes for symptomatic bradycardia    Current Outpatient Medications  Medication Sig Dispense Refill  . apixaban (ELIQUIS) 5 MG TABS tablet Take 1 tablet (5 mg total) by mouth 2 (two) times daily. Resume 06/26/15 60 tablet 6  . atorvastatin (LIPITOR) 10 MG tablet TAKE ONE (1) TABLET EACH DAY 90 tablet 3  . calcium carbonate (OS-CAL) 600 MG TABS Take 600 mg by mouth 2 (two) times daily with a meal.    . Cholecalciferol (VITAMIN  D3) 2000 UNITS TABS Take 1 tablet by mouth daily.    . Cranberry 1000 MG CAPS Take 1 capsule by mouth daily.     Marland Kitchen diltiazem (CARDIZEM CD) 180 MG 24 hr capsule Take 1 capsule (180 mg total) by mouth daily. 90 capsule 3  . diltiazem (CARDIZEM) 30 MG tablet Take 1 tablet (30 mg total) by mouth 4 (four) times daily as needed. 120 tablet 6  . ESTRACE VAGINAL 0.1 MG/GM vaginal cream Place 1 Applicatorful vaginally as needed.     . flecainide (TAMBOCOR) 100 MG tablet TAKE ONE TABLET TWICE DAILY 180 tablet 3  . hydrochlorothiazide (MICROZIDE) 12.5 MG capsule Take 1 capsule (12.5 mg total) by mouth daily. 90 capsule 3  . levothyroxine (SYNTHROID, LEVOTHROID) 100 MCG tablet Take 100 mcg by mouth daily.    Marland Kitchen losartan (COZAAR) 100 MG tablet Take 100 mg by mouth daily.     No current facility-administered medications for this visit.     No Known Allergies    Review of Systems negative except from HPI and PMH  Physical Exam BP 140/70 (BP Location: Left Arm, Patient Position: Sitting,  Cuff Size: Normal)   Pulse 68   Ht 5\' 4"  (1.626 m)   Wt 170 lb (77.1 kg)   BMI 29.18 kg/m  Well developed and nourished in no acute distress HENT normal Neck supple with JVP-flat Clear Regular rate and rhythm, no murmurs or gallops Abd-soft with active BS No Clubbing cyanosis edema Skin-warm and dry A & Oriented  Grossly normal sensory and motor function Device pocket well healed; without hematoma or erythema.  There is no tethering   ECG demonstrates atrial pacing at 67 20/14/43 Right bundle branch block    Assessment and  Plan  Atrial fibrillation-paroxysmal  Sinus node dysfunction  Pacemaker-[employer]tronic  The patient's device was interrogated.  The information was reviewed. No changes were made in the programming.     Right bundle branch block  Hypertension   Blood pressure well controlled.  We will have her check it at home to see if we could back off on some of her medications.  On  Anticoagulation;  No bleeding issues   Tolerating flecainide without evidence of excessive electrical effects  Minimal interval atrial fibrillation  We spent more than 50% of our >25 min visit in face to face counseling regarding the above

## 2018-07-30 LAB — CBC WITH DIFFERENTIAL/PLATELET
BASOS: 0 %
Basophils Absolute: 0 10*3/uL (ref 0.0–0.2)
EOS (ABSOLUTE): 0 10*3/uL (ref 0.0–0.4)
EOS: 0 %
HEMATOCRIT: 38.9 % (ref 34.0–46.6)
Hemoglobin: 12.7 g/dL (ref 11.1–15.9)
IMMATURE GRANS (ABS): 0 10*3/uL (ref 0.0–0.1)
IMMATURE GRANULOCYTES: 0 %
LYMPHS: 26 %
Lymphocytes Absolute: 1.8 10*3/uL (ref 0.7–3.1)
MCH: 28.4 pg (ref 26.6–33.0)
MCHC: 32.6 g/dL (ref 31.5–35.7)
MCV: 87 fL (ref 79–97)
MONOS ABS: 0.4 10*3/uL (ref 0.1–0.9)
Monocytes: 6 %
NEUTROS PCT: 68 %
Neutrophils Absolute: 4.7 10*3/uL (ref 1.4–7.0)
Platelets: 164 10*3/uL (ref 150–450)
RBC: 4.47 x10E6/uL (ref 3.77–5.28)
RDW: 12.8 % (ref 12.3–15.4)
WBC: 6.9 10*3/uL (ref 3.4–10.8)

## 2018-07-30 LAB — BASIC METABOLIC PANEL
BUN/Creatinine Ratio: 17 (ref 12–28)
BUN: 13 mg/dL (ref 8–27)
CO2: 25 mmol/L (ref 20–29)
CREATININE: 0.77 mg/dL (ref 0.57–1.00)
Calcium: 9.7 mg/dL (ref 8.7–10.3)
Chloride: 96 mmol/L (ref 96–106)
GFR, EST AFRICAN AMERICAN: 85 mL/min/{1.73_m2} (ref >59)
GFR, EST NON AFRICAN AMERICAN: 74 mL/min/{1.73_m2} (ref >59)
Glucose: 86 mg/dL (ref 65–99)
Potassium: 4.7 mmol/L (ref 3.5–5.2)
SODIUM: 133 mmol/L — AB (ref 134–144)

## 2018-08-06 LAB — CUP PACEART INCLINIC DEVICE CHECK
Brady Statistic AP VP Percent: 0.04 %
Brady Statistic AP VS Percent: 70.5 %
Brady Statistic AS VP Percent: 0.03 %
Brady Statistic RV Percent Paced: 0.06 %
Implantable Lead Implant Date: 20160720
Implantable Lead Location: 753860
Implantable Lead Model: 5076
Implantable Lead Model: 5076
Lead Channel Impedance Value: 342 Ohm
Lead Channel Impedance Value: 475 Ohm
Lead Channel Impedance Value: 513 Ohm
Lead Channel Pacing Threshold Amplitude: 1 V
Lead Channel Pacing Threshold Pulse Width: 0.4 ms
Lead Channel Sensing Intrinsic Amplitude: 8.75 mV
Lead Channel Setting Pacing Amplitude: 2.5 V
MDC IDC LEAD IMPLANT DT: 20160720
MDC IDC LEAD LOCATION: 753859
MDC IDC MSMT BATTERY REMAINING LONGEVITY: 92 mo
MDC IDC MSMT BATTERY VOLTAGE: 3.02 V
MDC IDC MSMT LEADCHNL RA SENSING INTR AMPL: 1.5 mV
MDC IDC MSMT LEADCHNL RV IMPEDANCE VALUE: 494 Ohm
MDC IDC MSMT LEADCHNL RV PACING THRESHOLD AMPLITUDE: 0.75 V
MDC IDC MSMT LEADCHNL RV PACING THRESHOLD PULSEWIDTH: 0.4 ms
MDC IDC PG IMPLANT DT: 20160720
MDC IDC SESS DTM: 20190827143313
MDC IDC SET LEADCHNL RA PACING AMPLITUDE: 2.25 V
MDC IDC SET LEADCHNL RV PACING PULSEWIDTH: 0.4 ms
MDC IDC SET LEADCHNL RV SENSING SENSITIVITY: 1.2 mV
MDC IDC STAT BRADY AS VS PERCENT: 29.43 %
MDC IDC STAT BRADY RA PERCENT PACED: 70.25 %

## 2018-08-18 ENCOUNTER — Other Ambulatory Visit: Payer: Self-pay | Admitting: *Deleted

## 2018-08-18 ENCOUNTER — Telehealth: Payer: Self-pay | Admitting: Internal Medicine

## 2018-08-18 DIAGNOSIS — E871 Hypo-osmolality and hyponatremia: Secondary | ICD-10-CM

## 2018-08-18 NOTE — Telephone Encounter (Signed)
Will call when staff schedule available

## 2018-08-18 NOTE — Telephone Encounter (Signed)
-----   Message from Emily Filbert, RN sent at 08/18/2018 11:25 AM EDT ----- Patient needs to come in around 11/27 for a repeat BMP per Dr. Caryl Comes.  Please call to schedule.  Results sent to the patient via MyChart stating she would be contacted to schedule a lab appt.   Order has been placed.   Thanks!

## 2018-08-22 NOTE — Telephone Encounter (Signed)
Attempted to call and schedule Patient line was busy Will try again at a later time

## 2018-08-22 NOTE — Telephone Encounter (Signed)
Scheduled 12/4 BMP

## 2018-08-23 DIAGNOSIS — I10 Essential (primary) hypertension: Secondary | ICD-10-CM | POA: Diagnosis not present

## 2018-08-25 ENCOUNTER — Telehealth: Payer: Self-pay | Admitting: Cardiovascular Disease

## 2018-08-25 LAB — CUP PACEART REMOTE DEVICE CHECK
Battery Remaining Longevity: 92 mo
Brady Statistic AP VS Percent: 75.5 %
Brady Statistic AS VS Percent: 24.45 %
Brady Statistic RV Percent Paced: 0.05 %
Implantable Lead Implant Date: 20160720
Implantable Lead Location: 753859
Implantable Lead Model: 5076
Implantable Pulse Generator Implant Date: 20160720
Lead Channel Impedance Value: 361 Ohm
Lead Channel Impedance Value: 475 Ohm
Lead Channel Pacing Threshold Amplitude: 0.625 V
Lead Channel Pacing Threshold Pulse Width: 0.4 ms
Lead Channel Sensing Intrinsic Amplitude: 1.25 mV
Lead Channel Sensing Intrinsic Amplitude: 1.25 mV
Lead Channel Sensing Intrinsic Amplitude: 9 mV
Lead Channel Setting Pacing Amplitude: 2.5 V
MDC IDC LEAD IMPLANT DT: 20160720
MDC IDC LEAD LOCATION: 753860
MDC IDC MSMT BATTERY VOLTAGE: 3.02 V
MDC IDC MSMT LEADCHNL RA PACING THRESHOLD AMPLITUDE: 0.875 V
MDC IDC MSMT LEADCHNL RA PACING THRESHOLD PULSEWIDTH: 0.4 ms
MDC IDC MSMT LEADCHNL RV IMPEDANCE VALUE: 494 Ohm
MDC IDC MSMT LEADCHNL RV IMPEDANCE VALUE: 532 Ohm
MDC IDC MSMT LEADCHNL RV SENSING INTR AMPL: 9 mV
MDC IDC SESS DTM: 20190815194233
MDC IDC SET LEADCHNL RA PACING AMPLITUDE: 2 V
MDC IDC SET LEADCHNL RV PACING PULSEWIDTH: 0.4 ms
MDC IDC SET LEADCHNL RV SENSING SENSITIVITY: 1.2 mV
MDC IDC STAT BRADY AP VP PERCENT: 0.04 %
MDC IDC STAT BRADY AS VP PERCENT: 0.01 %
MDC IDC STAT BRADY RA PERCENT PACED: 75.29 %

## 2018-08-25 NOTE — Telephone Encounter (Signed)
S/w patient. She last say Dr Caryl Comes in 07/29/18, who said she should check her BP periodically. She decided to check it on Friday and through the weekend. She is concerned because some of the readings were elevated. 08/25/18 168/71 HR 71 08/24/18 730 am 142/68 HR 87              130  pm 118/54 HR 80                 11 pm 165/80 HR 85 08/23/18 715 am 184/73 HR 93              11:30 am 190/73 HR 80               4 pm       162/70 HR 85 08/22/18  840 AM 155/71 HR 87                1025 pm 167/72 HR 89   Med list verified.  Denies headache, blurred vision, chest pain, SOB or swelling. Advised patient to continue to monitor BP/HR before taking BP medications and about 2 hours after over the next several days.  Advised against taking BP too often unless having symptoms.  Then to call us with the readings. She verbalized understanding. Routing to Dr Rockey Situ to review.

## 2018-08-25 NOTE — Telephone Encounter (Signed)
Pt c/o BP issue: STAT if pt c/o blurred vision, one-sided weakness or slurred speech  1. What are your last 5 BP readings?   08/25/18 168/71 HR 71 08/24/18 730 am 142/68 HR 87              130  pm 118/54 HR 80                 11 pm 165/80 HR 85 08/23/18 715 am 184/73 HR 93              11:30 am 190/73 HR 80               4 pm       162/70 HR 85 08/22/18  840 AM 155/71 HR 87                1025 pm 167/72 HR 89   2. Are you having any other symptoms (ex. Dizziness, headache, blurred vision, passed out)?   No 3. What is your BP issue?  It higher than normal. She is scheduled for a colonoscopy tomorrow but cancelled it  She did go to [facility] walk in Saturday, they told her to go back on Eliquis for she was off for the surgery.   She stated back on her Eliquis around noon on Saturday  Would like advise on this  Please call back

## 2018-08-26 ENCOUNTER — Ambulatory Visit: Admission: RE | Admit: 2018-08-26 | Payer: PPO | Source: Ambulatory Visit | Admitting: Gastroenterology

## 2018-08-26 ENCOUNTER — Encounter: Admission: RE | Payer: Self-pay | Source: Ambulatory Visit

## 2018-08-26 SURGERY — COLONOSCOPY WITH PROPOFOL
Anesthesia: General

## 2018-08-28 NOTE — Telephone Encounter (Signed)
To Dr. Gollan to review.  

## 2018-08-28 NOTE — Telephone Encounter (Signed)
Would consider increasing diltiazem extended release up to 240 mg daily  Would also take the HCTZ 12.5 mg daily that is on her last Change from as needed to daily

## 2018-08-28 NOTE — Telephone Encounter (Signed)
Pt c/o BP issue: STAT if pt c/o blurred vision, one-sided weakness or slurred speech  1. What are your last 5 BP readings?  9/23  8:00a 168/71 HR 71 12:15p 159/66 HR 92 11:15p 166/63 HR 80 9/24 7:15a 156/66 HR 86 12:00p 143/65 HR 87 11:00p 136/62 HR 72 9/25 8:30a 155/75 HR 86 12:00p 155/60 HR 91 10:00p 162/65 HR 87 9/26 7:45a 149/67 HR 79 12:00p 150/67 HR 81  2. Are you having any other symptoms (ex. Dizziness, headache, blurred vision, passed out)? no  3. What is your BP issue? Patient was told to call with update BP readings for nurse and Dr. Rockey Situ to review

## 2018-08-29 MED ORDER — DILTIAZEM HCL ER COATED BEADS 240 MG PO CP24: 240.0000 mg | ORAL_CAPSULE | Freq: Every day | ORAL | 1 refills | Status: DC

## 2018-08-29 NOTE — Telephone Encounter (Signed)
Patient made aware to increase her Diltiazem extended release to 240 mg daily. A new prescription has been sent in for her.  She stated that she has already been taking the HCTZ 12.5 mg daily.   She will check her blood pressure and call back if it does not decrease. She has also requested a nurse visit to have her blood pressure machine calibrated to make sure it is reading correct. Scheduling has been notified.

## 2018-09-01 ENCOUNTER — Telehealth: Payer: Self-pay

## 2018-09-01 NOTE — Telephone Encounter (Signed)
Pt states she no longer needs to bring BP monitor in. Alvis Lemmings, RN is going to calibrate it for her.

## 2018-09-01 NOTE — Telephone Encounter (Signed)
-----   Message from Ricci Barker, RN sent at 08/29/2018  1:12 PM EDT ----- Regarding: Nurse visit Patient needs a nurse visit for blood pressure machine calibration. Thank you!

## 2018-09-03 DIAGNOSIS — I480 Paroxysmal atrial fibrillation: Secondary | ICD-10-CM | POA: Diagnosis not present

## 2018-09-03 DIAGNOSIS — I10 Essential (primary) hypertension: Secondary | ICD-10-CM | POA: Diagnosis not present

## 2018-09-21 ENCOUNTER — Other Ambulatory Visit: Payer: Self-pay | Admitting: Cardiovascular Disease

## 2018-09-23 ENCOUNTER — Other Ambulatory Visit: Payer: Self-pay | Admitting: *Deleted

## 2018-09-23 MED ORDER — OLMESARTAN MEDOXOMIL-HCTZ 40-25 MG PO TABS: 1.0000 | ORAL_TABLET | Freq: Every day | ORAL | 2 refills | Status: DC

## 2018-10-16 ENCOUNTER — Ambulatory Visit (INDEPENDENT_AMBULATORY_CARE_PROVIDER_SITE_OTHER): Payer: PPO | Admitting: *Deleted

## 2018-10-16 ENCOUNTER — Telehealth: Payer: Self-pay | Admitting: Cardiology

## 2018-10-16 DIAGNOSIS — I495 Sick sinus syndrome: Secondary | ICD-10-CM

## 2018-10-16 NOTE — Telephone Encounter (Signed)
Attempted to confirm remote transmission with pt. No answer and was unable to leave a message.   

## 2018-10-16 NOTE — Progress Notes (Signed)
Remote pacemaker transmission.   

## 2018-10-22 DIAGNOSIS — Z79899 Other long term (current) drug therapy: Secondary | ICD-10-CM

## 2018-10-22 DIAGNOSIS — I480 Paroxysmal atrial fibrillation: Secondary | ICD-10-CM

## 2018-10-26 ENCOUNTER — Other Ambulatory Visit: Payer: Self-pay | Admitting: Cardiovascular Disease

## 2018-10-27 MED ORDER — SPIRONOLACTONE 25 MG PO TABS: 25.0000 mg | ORAL_TABLET | Freq: Every day | ORAL | 3 refills | Status: DC

## 2018-10-27 NOTE — Telephone Encounter (Signed)
Clarified with Dr Rockey Situ that spironolactone dose should be 25 mg daily.  Called and spoke with patient. She verbalized understanding of Dr Donivan Scull recommendations to take spironolactone 25 mg once a day. Confirmed with patient she is not taking losartan or hydrochlorothiazide pills and updated med list.  She is aware to go to the [facility] sometime the week of December 9th for BMET.  Rx sent to pharmacy.

## 2018-11-05 ENCOUNTER — Other Ambulatory Visit: Payer: PPO

## 2018-11-10 DIAGNOSIS — M542 Cervicalgia: Secondary | ICD-10-CM | POA: Diagnosis not present

## 2018-11-10 DIAGNOSIS — M5116 Intervertebral disc disorders with radiculopathy, lumbar region: Secondary | ICD-10-CM | POA: Diagnosis not present

## 2018-11-13 ENCOUNTER — Telehealth: Payer: Self-pay | Admitting: Cardiovascular Disease

## 2018-11-13 ENCOUNTER — Other Ambulatory Visit
Admission: RE | Admit: 2018-11-13 | Discharge: 2018-11-13 | Disposition: A | Discharge status: Home / Self Care | Payer: PPO | Source: Ambulatory Visit | Attending: Cardiovascular Disease | Admitting: Cardiovascular Disease

## 2018-11-13 DIAGNOSIS — Z79899 Other long term (current) drug therapy: Secondary | ICD-10-CM | POA: Insufficient documentation

## 2018-11-13 DIAGNOSIS — I480 Paroxysmal atrial fibrillation: Secondary | ICD-10-CM

## 2018-11-13 LAB — BASIC METABOLIC PANEL
ANION GAP: 9 (ref 5–15)
BUN: 40 mg/dL — ABNORMAL HIGH (ref 8–23)
CALCIUM: 9.8 mg/dL (ref 8.9–10.3)
CO2: 23 mmol/L (ref 22–32)
CREATININE: 1.13 mg/dL — AB (ref 0.44–1.00)
Chloride: 96 mmol/L — ABNORMAL LOW (ref 98–111)
GFR, EST AFRICAN AMERICAN: 53 mL/min — AB (ref >60)
GFR, EST NON AFRICAN AMERICAN: 46 mL/min — AB (ref >60)
GLUCOSE: 118 mg/dL — AB (ref 70–99)
Potassium: 5.9 mmol/L — ABNORMAL HIGH (ref 3.5–5.1)
Sodium: 128 mmol/L — ABNORMAL LOW (ref 135–145)

## 2018-11-13 NOTE — Telephone Encounter (Signed)
Spoke with patient and reviewed that her lab results came back abnormal. She reports that she has not been feeling well these last couple of days. Instructed her to stop taking the spironolactone and to try and increase hydration as well. Requested that she please go and have repeat labs done in one week over at the [facility] Entrance. She was agreeable with this plan and had no further questions at this time. Orders entered and medication discontinued.

## 2018-11-20 ENCOUNTER — Other Ambulatory Visit
Admission: RE | Admit: 2018-11-20 | Discharge: 2018-11-20 | Disposition: A | Discharge status: Home / Self Care | Payer: PPO | Attending: Cardiovascular Disease | Admitting: Cardiovascular Disease

## 2018-11-20 DIAGNOSIS — I480 Paroxysmal atrial fibrillation: Secondary | ICD-10-CM | POA: Diagnosis not present

## 2018-11-20 LAB — BASIC METABOLIC PANEL
Anion gap: 5 (ref 5–15)
BUN: 46 mg/dL — ABNORMAL HIGH (ref 8–23)
CO2: 23 mmol/L (ref 22–32)
Calcium: 9.5 mg/dL (ref 8.9–10.3)
Chloride: 102 mmol/L (ref 98–111)
Creatinine, Ser: 1.09 mg/dL — ABNORMAL HIGH (ref 0.44–1.00)
GFR calc Af Amer: 56 mL/min — ABNORMAL LOW (ref >60)
GFR calc non Af Amer: 48 mL/min — ABNORMAL LOW (ref >60)
Glucose, Bld: 106 mg/dL — ABNORMAL HIGH (ref 70–99)
Potassium: 5.7 mmol/L — ABNORMAL HIGH (ref 3.5–5.1)
Sodium: 130 mmol/L — ABNORMAL LOW (ref 135–145)

## 2018-11-21 ENCOUNTER — Telehealth: Payer: Self-pay | Admitting: Cardiovascular Disease

## 2018-11-21 DIAGNOSIS — I480 Paroxysmal atrial fibrillation: Secondary | ICD-10-CM

## 2018-11-21 MED ORDER — HYDROCHLOROTHIAZIDE 25 MG PO TABS: 25.0000 mg | ORAL_TABLET | Freq: Every day | ORAL | 3 refills | Status: DC

## 2018-11-21 MED ORDER — APIXABAN 5 MG PO TABS: 5.0000 mg | ORAL_TABLET | Freq: Two times a day (BID) | ORAL | 6 refills | Status: DC

## 2018-11-21 NOTE — Telephone Encounter (Signed)
Reviewed lab results with Ignacia Bayley NP here in office and recommendations were to hold benicar/HCTZ and start just HCTZ 25 mg once daily and then repeat BMET in one week then check with primary provider for any further recommendations. Will make these changes, notify patient and route to providers for further review.

## 2018-11-21 NOTE — Telephone Encounter (Signed)
Spoke with patient and advised that I would be checking with provider and would give her a call back with further recommendations before the end of today.

## 2018-11-21 NOTE — Telephone Encounter (Signed)
Patient calling to discuss recent lab testing results   Please call asap as patient husband is upset about delay in call back

## 2018-11-21 NOTE — Telephone Encounter (Signed)
Reviewed recommendations with patient to stop benicar/hctz, start HCTZ 25 mg and orders placed for her to have repeat labs next week. She verbalized understanding of changes with no further questions at this time.

## 2018-11-28 ENCOUNTER — Other Ambulatory Visit
Admission: RE | Admit: 2018-11-28 | Discharge: 2018-11-28 | Disposition: A | Discharge status: Home / Self Care | Payer: PPO | Source: Ambulatory Visit | Attending: Nurse Practitioner | Admitting: Nurse Practitioner

## 2018-11-28 DIAGNOSIS — I480 Paroxysmal atrial fibrillation: Secondary | ICD-10-CM | POA: Diagnosis not present

## 2018-11-28 LAB — BASIC METABOLIC PANEL
Anion gap: 8 (ref 5–15)
BUN: 19 mg/dL (ref 8–23)
CALCIUM: 9.1 mg/dL (ref 8.9–10.3)
CO2: 25 mmol/L (ref 22–32)
Chloride: 102 mmol/L (ref 98–111)
Creatinine, Ser: 0.89 mg/dL (ref 0.44–1.00)
GFR calc Af Amer: >60 mL/min (ref >60)
GFR calc non Af Amer: >60 mL/min (ref >60)
Glucose, Bld: 103 mg/dL — ABNORMAL HIGH (ref 70–99)
Potassium: 4 mmol/L (ref 3.5–5.1)
Sodium: 135 mmol/L (ref 135–145)

## 2018-12-01 ENCOUNTER — Encounter: Payer: Self-pay | Admitting: *Deleted

## 2018-12-02 ENCOUNTER — Ambulatory Visit: Payer: PPO | Admitting: Anesthesiology

## 2018-12-02 ENCOUNTER — Other Ambulatory Visit: Payer: Self-pay

## 2018-12-02 ENCOUNTER — Encounter
Admission: RE | Disposition: A | Discharge status: Home / Self Care | Payer: Self-pay | Source: Home / Self Care | Attending: Gastroenterology

## 2018-12-02 ENCOUNTER — Ambulatory Visit
Admission: RE | Admit: 2018-12-02 | Discharge: 2018-12-02 | Disposition: A | Discharge status: Home / Self Care | Payer: PPO | Attending: Gastroenterology | Admitting: Gastroenterology

## 2018-12-02 ENCOUNTER — Encounter: Payer: Self-pay | Admitting: *Deleted

## 2018-12-02 DIAGNOSIS — D123 Benign neoplasm of transverse colon: Secondary | ICD-10-CM | POA: Insufficient documentation

## 2018-12-02 DIAGNOSIS — J45909 Unspecified asthma, uncomplicated: Secondary | ICD-10-CM | POA: Insufficient documentation

## 2018-12-02 DIAGNOSIS — K6389 Other specified diseases of intestine: Secondary | ICD-10-CM | POA: Diagnosis not present

## 2018-12-02 DIAGNOSIS — K5900 Constipation, unspecified: Secondary | ICD-10-CM | POA: Diagnosis not present

## 2018-12-02 DIAGNOSIS — I495 Sick sinus syndrome: Secondary | ICD-10-CM | POA: Insufficient documentation

## 2018-12-02 DIAGNOSIS — I480 Paroxysmal atrial fibrillation: Secondary | ICD-10-CM | POA: Insufficient documentation

## 2018-12-02 DIAGNOSIS — D125 Benign neoplasm of sigmoid colon: Secondary | ICD-10-CM | POA: Diagnosis not present

## 2018-12-02 DIAGNOSIS — K635 Polyp of colon: Secondary | ICD-10-CM | POA: Diagnosis not present

## 2018-12-02 DIAGNOSIS — R194 Change in bowel habit: Secondary | ICD-10-CM | POA: Diagnosis not present

## 2018-12-02 DIAGNOSIS — K621 Rectal polyp: Secondary | ICD-10-CM | POA: Diagnosis not present

## 2018-12-02 DIAGNOSIS — Z86010 Personal history of colonic polyps: Secondary | ICD-10-CM | POA: Diagnosis not present

## 2018-12-02 DIAGNOSIS — Z79899 Other long term (current) drug therapy: Secondary | ICD-10-CM | POA: Diagnosis not present

## 2018-12-02 DIAGNOSIS — I10 Essential (primary) hypertension: Secondary | ICD-10-CM | POA: Insufficient documentation

## 2018-12-02 DIAGNOSIS — E049 Nontoxic goiter, unspecified: Secondary | ICD-10-CM | POA: Insufficient documentation

## 2018-12-02 DIAGNOSIS — K5730 Diverticulosis of large intestine without perforation or abscess without bleeding: Secondary | ICD-10-CM | POA: Insufficient documentation

## 2018-12-02 DIAGNOSIS — E039 Hypothyroidism, unspecified: Secondary | ICD-10-CM | POA: Insufficient documentation

## 2018-12-02 DIAGNOSIS — N6019 Diffuse cystic mastopathy of unspecified breast: Secondary | ICD-10-CM | POA: Insufficient documentation

## 2018-12-02 DIAGNOSIS — D126 Benign neoplasm of colon, unspecified: Secondary | ICD-10-CM | POA: Diagnosis not present

## 2018-12-02 DIAGNOSIS — Z7901 Long term (current) use of anticoagulants: Secondary | ICD-10-CM | POA: Insufficient documentation

## 2018-12-02 DIAGNOSIS — D124 Benign neoplasm of descending colon: Secondary | ICD-10-CM | POA: Insufficient documentation

## 2018-12-02 HISTORY — DX: Diverticulosis of intestine, part unspecified, without perforation or abscess without bleeding: K57.90

## 2018-12-02 HISTORY — DX: Personal history of other infectious and parasitic diseases: Z86.19

## 2018-12-02 HISTORY — PX: COLONOSCOPY WITH PROPOFOL: SHX5780

## 2018-12-02 HISTORY — DX: Cardiac arrhythmia, unspecified: I49.9

## 2018-12-02 HISTORY — DX: Personal history of colonic polyps: Z86.010

## 2018-12-02 HISTORY — DX: Personal history of colon polyps, unspecified: Z86.0100

## 2018-12-02 SURGERY — COLONOSCOPY WITH PROPOFOL
Anesthesia: General

## 2018-12-02 MED ORDER — PROPOFOL 500 MG/50ML IV EMUL
INTRAVENOUS | Status: DC | PRN
  Administered 2018-12-02: 140 ug/kg/min via INTRAVENOUS

## 2018-12-02 MED ORDER — PROPOFOL 10 MG/ML IV BOLUS
INTRAVENOUS | Status: AC
  Filled 2018-12-02: qty 20

## 2018-12-02 MED ORDER — PROPOFOL 10 MG/ML IV BOLUS
INTRAVENOUS | Status: DC | PRN
  Administered 2018-12-02: 50 mg via INTRAVENOUS
  Administered 2018-12-02: 20 mg via INTRAVENOUS

## 2018-12-02 MED ORDER — SODIUM CHLORIDE 0.9 % IV SOLN
INTRAVENOUS | Status: DC
  Administered 2018-12-02: 08:00:00 via INTRAVENOUS

## 2018-12-02 MED ORDER — PROPOFOL 10 MG/ML IV BOLUS
INTRAVENOUS | Status: AC
  Filled 2018-12-02: qty 40

## 2018-12-02 NOTE — Op Note (Signed)
[facility] Gastroenterology Patient Name: Hailey Simmons Procedure Date: 12/02/2018 7:41 AM MRN: [id number] Account #: [id number] Date of Birth: 1936-06-16 Admit Type: Outpatient Age: 82 Room: [facility] ENDO ROOM 3 Gender: Female Note Status: Finalized Procedure:            Colonoscopy Indications:          Constipation, follow up anomally on previous colonoscopy Providers:            Lollie Sails, MD Referring MD:         Rusty Aus, MD (Referring MD) Medicines:            Monitored Anesthesia Care Complications:        No immediate complications. Procedure:            Pre-Anesthesia Assessment:                       - ASA Grade Assessment: III - A patient with severe                        systemic disease.                       After obtaining informed consent, the colonoscope was                        passed under direct vision. Throughout the procedure,                        the patient's blood pressure, pulse, and oxygen                        saturations were monitored continuously. The                        Colonoscope was introduced through the anus and                        advanced to the the cecum, identified by appendiceal                        orifice and ileocecal valve. The colonoscopy was                        unusually difficult due to a redundant colon.                        Successful completion of the procedure was aided by                        changing the patient to a supine position, changing the                        patient to a prone position and using manual pressure.                        The quality of the bowel preparation was good. Findings:      Two sessile polyps were found in the rectum. The polyps were 1 to 2 mm       in size. These polyps were removed with a cold biopsy forceps. Resection  and retrieval were complete.      A 2 mm polyp was found in the distal sigmoid colon. The polyp was       sessile.  The polyp was removed with a cold biopsy forceps. Resection and       retrieval were complete.      A 4 mm polyp was found in the descending colon. The polyp was sessile.       The polyp was removed with a cold biopsy forceps. Resection and       retrieval were complete.      A 4 mm polyp was found in the transverse colon. The polyp was sessile.       The polyp was removed with a cold biopsy forceps. Resection and       retrieval were complete.      Biopsies for histology were taken with a cold forceps from the right       colon and left colon for evaluation of microscopic colitis.      A 3 mm polyp was found in the transverse colon. The polyp was sessile.       The polyp was removed with a cold biopsy forceps. Resection and       retrieval were complete.      A 1 mm polyp was found in the rectum. The polyp was sessile. The polyp       was removed with a cold biopsy forceps. Resection and retrieval were       complete.      Multiple medium-mouthed diverticula were found in the sigmoid colon and       distal descending colon.      The digital rectal exam was normal.      The retroflexed view of the distal rectum and anal verge was normal and       showed no anal or rectal abnormalities.      A diffuse area of moderate melanosis was found in the entire colon. Impression:           - Two 1 to 2 mm polyps in the rectum, removed with a                        cold biopsy forceps. Resected and retrieved.                       - One 2 mm polyp in the distal sigmoid colon, removed                        with a cold biopsy forceps. Resected and retrieved.                       - One 4 mm polyp in the descending colon, removed with                        a cold biopsy forceps. Resected and retrieved.                       - One 4 mm polyp in the transverse colon, removed with                        a cold biopsy forceps. Resected and retrieved.                       -  One 3 mm polyp in the  transverse colon, removed with                        a cold biopsy forceps. Resected and retrieved.                       - One 1 mm polyp in the rectum, removed with a cold                        biopsy forceps. Resected and retrieved.                       - Diverticulosis in the sigmoid colon and in the distal                        descending colon.                       - The distal rectum and anal verge are normal on                        retroflexion view.                       - Biopsies were taken with a cold forceps from the                        right colon and left colon for evaluation of                        microscopic colitis. Recommendation:       - Discharge patient to home. Procedure Code(s):    --- Professional ---                       [phone], Colonoscopy, flexible; with biopsy, single or                        multiple Diagnosis Code(s):    --- Professional ---                       K62.1, Rectal polyp                       D12.5, Benign neoplasm of sigmoid colon                       D12.4, Benign neoplasm of descending colon                       D12.3, Benign neoplasm of transverse colon (hepatic                        flexure or splenic flexure)                       K59.00, Constipation, unspecified                       K57.30, Diverticulosis of large intestine without                        perforation  or abscess without bleeding CPT copyright 2018 American Medical Association. All rights reserved. The codes documented in this report are preliminary and upon coder review may  be revised to meet current compliance requirements. Lollie Sails, MD 12/02/2018 8:40:49 AM This report has been signed electronically. Number of Addenda: 0 Note Initiated On: 12/02/2018 7:41 AM Scope Withdrawal Time: 0 hours 15 minutes 19 seconds  Total Procedure Duration: 0 hours 45 minutes 20 seconds       [facility]

## 2018-12-02 NOTE — Anesthesia Post-op Follow-up Note (Signed)
Anesthesia QCDR form completed.        

## 2018-12-02 NOTE — Anesthesia Postprocedure Evaluation (Signed)
Anesthesia Post Note  Patient: Hailey Simmons  Procedure(s) Performed: COLONOSCOPY WITH PROPOFOL (N/A )  Anesthesia Type: General     Last Vitals:  Vitals:   12/02/18 0712 12/02/18 0838  BP: (!) 182/70 (!) 118/46  Pulse: 85 62  Resp: 16 19  Temp: (!) 36.3 C (!) 36.2 C  SpO2: 100% 100%    Last Pain:  Vitals:   12/02/18 0838  TempSrc: Tympanic  PainSc: Asleep                 Lenzie Montesano Ozzie Hoyle

## 2018-12-02 NOTE — Anesthesia Postprocedure Evaluation (Signed)
Anesthesia Post Note  Patient: Hailey Simmons  Procedure(s) Performed: COLONOSCOPY WITH PROPOFOL (N/A )  Patient location during evaluation: Endoscopy Anesthesia Type: General Level of consciousness: awake and alert and oriented Pain management: pain level controlled Vital Signs Assessment: post-procedure vital signs reviewed and stable Respiratory status: spontaneous breathing, nonlabored ventilation and respiratory function stable Cardiovascular status: blood pressure returned to baseline and stable Postop Assessment: no signs of nausea or vomiting Anesthetic complications: no     Last Vitals:  Vitals:   12/02/18 0712 12/02/18 0838  BP: (!) 182/70 (!) 118/46  Pulse: 85 62  Resp: 16 19  Temp: (!) 36.3 C (!) 36.2 C  SpO2: 100% 100%    Last Pain:  Vitals:   12/02/18 0918  TempSrc:   PainSc: 0-No pain                 Izack Hoogland

## 2018-12-02 NOTE — Transfer of Care (Signed)
Immediate Anesthesia Transfer of Care Note  Patient: Hailey Simmons  Procedure(s) Performed: COLONOSCOPY WITH PROPOFOL (N/A )  Patient Location: PACU  Anesthesia Type:MAC  Level of Consciousness: drowsy  Airway & Oxygen Therapy: Patient Spontanous Breathing and Patient connected to nasal cannula oxygen  Post-op Assessment: Report given to RN  Post vital signs: stable  Last Vitals:  Vitals Value Taken Time  BP 118/46 12/02/2018  8:39 AM  Temp 36.2 C 12/02/2018  8:38 AM  Pulse 64 12/02/2018  8:43 AM  Resp 17 12/02/2018  8:43 AM  SpO2 100 % 12/02/2018  8:43 AM  Vitals shown include unvalidated device data.  Last Pain:  Vitals:   12/02/18 0838  TempSrc: Tympanic  PainSc: Asleep         Complications: No apparent anesthesia complications

## 2018-12-02 NOTE — H&P (Signed)
Outpatient short stay form Pre-procedure 12/02/2018 7:32 AM Hailey Sails MD  Primary Physician: Dr. Emily Filbert  Reason for visit: Colonoscopy  History of present illness: Patient is a 82 year old female presenting today for colonoscopy.  She has a history of chronic constipation.  Several years ago she had her last colonoscopy on 03/29/2015.  There is a small area of elevated mucosa in the sigmoid colon about 25 cm from the rectum.  A biopsy of this showed only some hyperplastic tissue however she is presenting today for recheck on this area as well.  She denies any problems with rectal bleeding abdominal pain or diarrhea.  She does take Eliquis and has held that for over 48 hours.    Current Facility-Administered Medications:  .  0.9 %  sodium chloride infusion, , Intravenous, Continuous, Hailey Sails, MD  Medications Prior to Admission  Medication Sig Dispense Refill Last Dose  . apixaban (ELIQUIS) 5 MG TABS tablet Take 1 tablet (5 mg total) by mouth 2 (two) times daily. Resume 06/26/15 60 tablet 6 11/29/2018 at Unknown time  . atorvastatin (LIPITOR) 10 MG tablet TAKE ONE (1) TABLET EACH DAY 90 tablet 3 12/01/2018 at 2130  . calcium carbonate (OS-CAL) 600 MG TABS Take 600 mg by mouth 2 (two) times daily with a meal.   Past Week at Unknown time  . Cholecalciferol (VITAMIN D3) 2000 UNITS TABS Take 1 tablet by mouth daily.   Past Week at Unknown time  . Cranberry 1000 MG CAPS Take 1 capsule by mouth daily.    Past Week at Unknown time  . diltiazem (CARDIZEM CD) 240 MG 24 hr capsule Take 1 capsule (240 mg total) by mouth daily. 90 capsule 1 12/01/2018 at 2130  . diltiazem (CARDIZEM) 30 MG tablet Take 1 tablet (30 mg total) by mouth 4 (four) times daily as needed. 120 tablet 6 12/01/2018 at 2130  . diltiazem (TIAZAC) 180 MG 24 hr capsule Take 180 mg by mouth daily.   12/01/2018 at 2130  . ESTRACE VAGINAL 0.1 MG/GM vaginal cream Place 1 Applicatorful vaginally as needed.    Past Week  at Unknown time  . flecainide (TAMBOCOR) 100 MG tablet TAKE ONE TABLET TWICE DAILY 180 tablet 3 12/01/2018 at 0500  . hydrochlorothiazide (HYDRODIURIL) 25 MG tablet Take 1 tablet (25 mg total) by mouth daily. 90 tablet 3 12/01/2018 at 0800  . levothyroxine (SYNTHROID, LEVOTHROID) 100 MCG tablet Take 100 mcg by mouth daily.   12/01/2018 at 0800  . losartan (COZAAR) 100 MG tablet Take 100 mg by mouth daily.        No Known Allergies   Past Medical History:  Diagnosis Date  . Asthma 1970's - 1990's   a. ? 2/2 wood stove usage.  . Basal cell carcinoma of skin of nose   . Diverticulosis   . Dysrhythmia    A-FIB  . Fibrocystic breast disease   . Goiter   . History of chicken pox   . Hx of colonic polyps   . Hypertension   . Hypothyroidism   . Menopause   . PAF (paroxysmal atrial fibrillation) ([place])    on apixoban  . Sinus node dysfunction ([facility])    a. s/p MDT dual chamber pacemaker  . Small bowel obstruction ([place]) 08/2009    Review of systems:      Physical Exam    Heart and lungs: Rhythm without rub or gallop, lungs are bilaterally clear.    HEENT: Cephalic atraumatic eyes are anicteric  Other:    Pertinant exam for procedure: Soft nontender nondistended bowel sounds positive normoactive.    Planned proceedures: Colonoscopy and indicated procedures. I have discussed the risks benefits and complications of procedures to include not limited to bleeding, infection, perforation and the risk of sedation and the patient wishes to proceed.    Hailey Sails, MD Gastroenterology 12/02/2018  7:32 AM

## 2018-12-02 NOTE — Anesthesia Preprocedure Evaluation (Addendum)
Anesthesia Evaluation  Patient identified by MRN, date of birth, ID band Patient awake    Reviewed: Allergy & Precautions, NPO status , Patient's Chart, lab work & pertinent test results  History of Anesthesia Complications Negative for: history of anesthetic complications  Airway Mallampati: II  TM Distance: >3 FB Neck ROM: Full    Dental no notable dental hx.    Pulmonary asthma , neg sleep apnea,    breath sounds clear to auscultation- rhonchi (-) wheezing      Cardiovascular hypertension, Pt. on medications (-) CAD, (-) Past MI, (-) Cardiac Stents and (-) CABG + dysrhythmias (PAF) Atrial Fibrillation + pacemaker  Rhythm:Regular Rate:Normal - Systolic murmurs and - Diastolic murmurs    Neuro/Psych neg Seizures negative neurological ROS  negative psych ROS   GI/Hepatic negative GI ROS, Neg liver ROS,   Endo/Other  neg diabetesHypothyroidism   Renal/GU negative Renal ROS     Musculoskeletal negative musculoskeletal ROS (+)   Abdominal (+) - obese,   Peds  Hematology negative hematology ROS (+)   Anesthesia Other Findings Past Medical History: 1970's - 1990's: Asthma     Comment:  a. ? 2/2 wood stove usage. No date: Basal cell carcinoma of skin of nose No date: Diverticulosis No date: Dysrhythmia     Comment:  A-FIB No date: Fibrocystic breast disease No date: Goiter No date: History of chicken pox No date: Hx of colonic polyps No date: Hypertension No date: Hypothyroidism No date: Menopause No date: PAF (paroxysmal atrial fibrillation) (HCC)     Comment:  on apixoban No date: Sinus node dysfunction (HCC)     Comment:  a. s/p MDT dual chamber pacemaker 08/2009: Small bowel obstruction (HCC)   Reproductive/Obstetrics                            Anesthesia Physical Anesthesia Plan  ASA: III  Anesthesia Plan: General   Post-op Pain Management:    Induction:  Intravenous  PONV Risk Score and Plan: 2 and Propofol infusion  Airway Management Planned: Natural Airway  Additional Equipment:   Intra-op Plan:   Post-operative Plan:   Informed Consent: I have reviewed the patients History and Physical, chart, labs and discussed the procedure including the risks, benefits and alternatives for the proposed anesthesia with the patient or authorized representative who has indicated his/her understanding and acceptance.   Dental advisory given  Plan Discussed with: CRNA and Anesthesiologist  Anesthesia Plan Comments:         Anesthesia Quick Evaluation

## 2018-12-04 ENCOUNTER — Encounter: Payer: Self-pay | Admitting: Gastroenterology

## 2018-12-04 LAB — SURGICAL PATHOLOGY

## 2018-12-06 ENCOUNTER — Other Ambulatory Visit: Payer: Self-pay

## 2018-12-06 ENCOUNTER — Emergency Department: Payer: PPO

## 2018-12-06 ENCOUNTER — Observation Stay
Admission: EM | Admit: 2018-12-06 | Discharge: 2018-12-08 | Disposition: A | Discharge status: Home / Self Care | Payer: PPO | Attending: Internal Medicine | Admitting: Internal Medicine

## 2018-12-06 DIAGNOSIS — E876 Hypokalemia: Secondary | ICD-10-CM | POA: Diagnosis not present

## 2018-12-06 DIAGNOSIS — E871 Hypo-osmolality and hyponatremia: Secondary | ICD-10-CM | POA: Diagnosis not present

## 2018-12-06 DIAGNOSIS — I480 Paroxysmal atrial fibrillation: Secondary | ICD-10-CM | POA: Insufficient documentation

## 2018-12-06 DIAGNOSIS — Z7901 Long term (current) use of anticoagulants: Secondary | ICD-10-CM | POA: Diagnosis not present

## 2018-12-06 DIAGNOSIS — I495 Sick sinus syndrome: Secondary | ICD-10-CM | POA: Diagnosis not present

## 2018-12-06 DIAGNOSIS — E039 Hypothyroidism, unspecified: Secondary | ICD-10-CM | POA: Diagnosis not present

## 2018-12-06 DIAGNOSIS — Z79890 Hormone replacement therapy (postmenopausal): Secondary | ICD-10-CM | POA: Insufficient documentation

## 2018-12-06 DIAGNOSIS — R531 Weakness: Principal | ICD-10-CM

## 2018-12-06 DIAGNOSIS — I10 Essential (primary) hypertension: Secondary | ICD-10-CM | POA: Diagnosis not present

## 2018-12-06 DIAGNOSIS — Z79899 Other long term (current) drug therapy: Secondary | ICD-10-CM | POA: Diagnosis not present

## 2018-12-06 LAB — CBC WITH DIFFERENTIAL/PLATELET
Abs Immature Granulocytes: 0.05 10*3/uL (ref 0.00–0.07)
BASOS ABS: 0 10*3/uL (ref 0.0–0.1)
Basophils Relative: 0 %
Eosinophils Absolute: 0 10*3/uL (ref 0.0–0.5)
Eosinophils Relative: 0 %
HCT: 35.3 % — ABNORMAL LOW (ref 36.0–46.0)
Hemoglobin: 11.8 g/dL — ABNORMAL LOW (ref 12.0–15.0)
Immature Granulocytes: 1 %
LYMPHS ABS: 1.6 10*3/uL (ref 0.7–4.0)
LYMPHS PCT: 18 %
MCH: 28.7 pg (ref 26.0–34.0)
MCHC: 33.4 g/dL (ref 30.0–36.0)
MCV: 85.9 fL (ref 80.0–100.0)
Monocytes Absolute: 0.7 10*3/uL (ref 0.1–1.0)
Monocytes Relative: 8 %
Neutro Abs: 6.6 10*3/uL (ref 1.7–7.7)
Neutrophils Relative %: 73 %
Platelets: 179 10*3/uL (ref 150–400)
RBC: 4.11 MIL/uL (ref 3.87–5.11)
RDW: 12.9 % (ref 11.5–15.5)
WBC: 9 10*3/uL (ref 4.0–10.5)
nRBC: 0 % (ref 0.0–0.2)

## 2018-12-06 NOTE — ED Triage Notes (Signed)
Pt arrived from home via EMS with complaints of generalized weakness since yesterday.  Pt states that she just feels "not right". Pt is alert and oriented x 4. Pt has Hx of HTN an A-Fib. EMS stated that 12-Lead shows possible right bundle branch block. VS per EMS BP-172/65 HR-69-82 O2sat-98%RA. Pt denies any pain at this time.

## 2018-12-07 ENCOUNTER — Encounter: Payer: Self-pay | Admitting: Internal Medicine

## 2018-12-07 ENCOUNTER — Other Ambulatory Visit: Payer: Self-pay

## 2018-12-07 DIAGNOSIS — E860 Dehydration: Secondary | ICD-10-CM | POA: Diagnosis not present

## 2018-12-07 DIAGNOSIS — R531 Weakness: Secondary | ICD-10-CM | POA: Diagnosis not present

## 2018-12-07 DIAGNOSIS — E871 Hypo-osmolality and hyponatremia: Secondary | ICD-10-CM | POA: Diagnosis not present

## 2018-12-07 DIAGNOSIS — E878 Other disorders of electrolyte and fluid balance, not elsewhere classified: Secondary | ICD-10-CM | POA: Diagnosis not present

## 2018-12-07 DIAGNOSIS — E876 Hypokalemia: Secondary | ICD-10-CM | POA: Diagnosis not present

## 2018-12-07 LAB — BASIC METABOLIC PANEL
Anion gap: 5 (ref 5–15)
BUN: 10 mg/dL (ref 8–23)
CO2: 23 mmol/L (ref 22–32)
Calcium: 8.1 mg/dL — ABNORMAL LOW (ref 8.9–10.3)
Chloride: 103 mmol/L (ref 98–111)
Creatinine, Ser: 0.65 mg/dL (ref 0.44–1.00)
GFR calc Af Amer: >60 mL/min (ref >60)
GFR calc non Af Amer: >60 mL/min (ref >60)
Glucose, Bld: 103 mg/dL — ABNORMAL HIGH (ref 70–99)
Potassium: 3.8 mmol/L (ref 3.5–5.1)
Sodium: 131 mmol/L — ABNORMAL LOW (ref 135–145)

## 2018-12-07 LAB — COMPREHENSIVE METABOLIC PANEL
ALT: 29 U/L (ref 0–44)
AST: 31 U/L (ref 15–41)
Albumin: 3.9 g/dL (ref 3.5–5.0)
Alkaline Phosphatase: 125 U/L (ref 38–126)
Anion gap: 8 (ref 5–15)
BUN: 18 mg/dL (ref 8–23)
CO2: 24 mmol/L (ref 22–32)
Calcium: 8.4 mg/dL — ABNORMAL LOW (ref 8.9–10.3)
Chloride: 90 mmol/L — ABNORMAL LOW (ref 98–111)
Creatinine, Ser: 0.72 mg/dL (ref 0.44–1.00)
GFR calc Af Amer: >60 mL/min (ref >60)
GFR calc non Af Amer: >60 mL/min (ref >60)
Glucose, Bld: 119 mg/dL — ABNORMAL HIGH (ref 70–99)
POTASSIUM: 3.3 mmol/L — AB (ref 3.5–5.1)
Sodium: 122 mmol/L — ABNORMAL LOW (ref 135–145)
Total Bilirubin: 0.5 mg/dL (ref 0.3–1.2)
Total Protein: 6.7 g/dL (ref 6.5–8.1)

## 2018-12-07 LAB — URINALYSIS, COMPLETE (UACMP) WITH MICROSCOPIC
Bilirubin Urine: NEGATIVE
Glucose, UA: NEGATIVE mg/dL
Hgb urine dipstick: NEGATIVE
Ketones, ur: NEGATIVE mg/dL
Leukocytes, UA: NEGATIVE
Nitrite: NEGATIVE
PH: 7 (ref 5.0–8.0)
Protein, ur: NEGATIVE mg/dL
Specific Gravity, Urine: 1.009 (ref 1.005–1.030)

## 2018-12-07 LAB — TSH: TSH: 1.672 u[IU]/mL (ref 0.350–4.500)

## 2018-12-07 LAB — PHOSPHORUS: Phosphorus: 2.5 mg/dL (ref 2.5–4.6)

## 2018-12-07 LAB — MAGNESIUM: Magnesium: 1.9 mg/dL (ref 1.7–2.4)

## 2018-12-07 LAB — TROPONIN I

## 2018-12-07 MED ORDER — DILTIAZEM HCL ER COATED BEADS 240 MG PO CP24
240.0000 mg | ORAL_CAPSULE | Freq: Every day | ORAL | Status: DC
  Administered 2018-12-07 – 2018-12-08 (×2): 240 mg via ORAL
  Filled 2018-12-07 (×2): qty 1

## 2018-12-07 MED ORDER — ACETAMINOPHEN 325 MG PO TABS: 650.0000 mg | ORAL_TABLET | Freq: Four times a day (QID) | ORAL | Status: DC | PRN

## 2018-12-07 MED ORDER — SODIUM CHLORIDE 0.9 % IV BOLUS
1000.0000 mL | Freq: Once | INTRAVENOUS | Status: AC
  Administered 2018-12-07: 1000 mL via INTRAVENOUS

## 2018-12-07 MED ORDER — APIXABAN 5 MG PO TABS
5.0000 mg | ORAL_TABLET | Freq: Two times a day (BID) | ORAL | Status: DC
  Administered 2018-12-07 – 2018-12-08 (×3): 5 mg via ORAL
  Filled 2018-12-07 (×3): qty 1

## 2018-12-07 MED ORDER — FLECAINIDE ACETATE 100 MG PO TABS
100.0000 mg | ORAL_TABLET | Freq: Two times a day (BID) | ORAL | Status: DC
  Administered 2018-12-07 – 2018-12-08 (×3): 100 mg via ORAL
  Filled 2018-12-07 (×5): qty 1

## 2018-12-07 MED ORDER — SODIUM CHLORIDE 0.9 % IV SOLN
INTRAVENOUS | Status: AC
  Administered 2018-12-07: 04:00:00 via INTRAVENOUS

## 2018-12-07 MED ORDER — SENNOSIDES-DOCUSATE SODIUM 8.6-50 MG PO TABS: 1.0000 | ORAL_TABLET | Freq: Every evening | ORAL | Status: DC | PRN

## 2018-12-07 MED ORDER — ONDANSETRON HCL 4 MG/2ML IJ SOLN: 4.0000 mg | Freq: Four times a day (QID) | INTRAMUSCULAR | Status: DC | PRN

## 2018-12-07 MED ORDER — ATORVASTATIN CALCIUM 20 MG PO TABS
10.0000 mg | ORAL_TABLET | Freq: Every day | ORAL | Status: DC
  Administered 2018-12-07: 18:00:00 10 mg via ORAL
  Filled 2018-12-07: qty 1

## 2018-12-07 MED ORDER — POTASSIUM CHLORIDE CRYS ER 20 MEQ PO TBCR
40.0000 meq | EXTENDED_RELEASE_TABLET | Freq: Once | ORAL | Status: AC
  Administered 2018-12-07: 40 meq via ORAL
  Filled 2018-12-07: qty 2

## 2018-12-07 MED ORDER — BISACODYL 5 MG PO TBEC: 5.0000 mg | DELAYED_RELEASE_TABLET | Freq: Every day | ORAL | Status: DC | PRN

## 2018-12-07 MED ORDER — AMLODIPINE BESYLATE 5 MG PO TABS
5.0000 mg | ORAL_TABLET | Freq: Every day | ORAL | Status: DC
  Administered 2018-12-07: 5 mg via ORAL
  Filled 2018-12-07 (×2): qty 1

## 2018-12-07 MED ORDER — LOSARTAN POTASSIUM 50 MG PO TABS: 100.0000 mg | ORAL_TABLET | Freq: Every day | ORAL | Status: DC

## 2018-12-07 MED ORDER — SODIUM CHLORIDE 0.9% FLUSH
3.0000 mL | Freq: Two times a day (BID) | INTRAVENOUS | Status: DC
  Administered 2018-12-07: 22:00:00 3 mL via INTRAVENOUS

## 2018-12-07 MED ORDER — SODIUM CHLORIDE 0.9 % IV BOLUS
1000.0000 mL | Freq: Once | INTRAVENOUS | Status: AC
  Administered 2018-12-07: 03:00:00 1000 mL via INTRAVENOUS

## 2018-12-07 MED ORDER — ACETAMINOPHEN 650 MG RE SUPP: 650.0000 mg | Freq: Four times a day (QID) | RECTAL | Status: DC | PRN

## 2018-12-07 MED ORDER — LEVOTHYROXINE SODIUM 100 MCG PO TABS
100.0000 ug | ORAL_TABLET | Freq: Every day | ORAL | Status: DC
  Administered 2018-12-07 – 2018-12-08 (×2): 100 ug via ORAL
  Filled 2018-12-07 (×2): qty 1

## 2018-12-07 MED ORDER — ONDANSETRON HCL 4 MG PO TABS: 4.0000 mg | ORAL_TABLET | Freq: Four times a day (QID) | ORAL | Status: DC | PRN

## 2018-12-07 NOTE — Care Management Obs Status (Signed)
[facility] NOTIFICATION   Patient Details  Name: Hailey Simmons MRN: [id number] Date of Birth: Jun 29, 1936   Medicare Observation Status Notification Given:  Yes    Jeneva Schweizer A Kortney Potvin, RN 12/07/2018, 1:35 PM

## 2018-12-07 NOTE — Progress Notes (Signed)
Reports generalized weakness, but feels some better. Sodium level up to 131. K+ po supplement given. Tolerating diet well. IVF's discontinued.

## 2018-12-07 NOTE — Progress Notes (Signed)
[facility] at [place] NAME: Hailey Simmons    MR#:  [id number]  DATE OF BIRTH:  [birth year]  SUBJECTIVE:   Patient presented to the hospital due to weakness and malaise and noted to be hyponatremic.  Hyponatremia improved with some gentle IV fluids.  Patient feels a bit better.  REVIEW OF SYSTEMS:    Review of Systems  Constitutional: Negative for chills and fever.  HENT: Negative for congestion and tinnitus.   Eyes: Negative for blurred vision and double vision.  Respiratory: Negative for cough, shortness of breath and wheezing.   Cardiovascular: Negative for chest pain, orthopnea and PND.  Gastrointestinal: Negative for abdominal pain, diarrhea, nausea and vomiting.  Genitourinary: Negative for dysuria and hematuria.  Neurological: Positive for weakness (generalized). Negative for dizziness, sensory change and focal weakness.  All other systems reviewed and are negative.   Nutrition: Heart Healthy Tolerating Diet: Yes Tolerating PT: Await Eval.    DRUG ALLERGIES:  No Known Allergies  VITALS:  Blood pressure (!) 133/49, pulse 63, temperature 98.2 F (36.8 C), temperature source Oral, resp. rate 20, height 5\' 4"  (1.626 m), weight 74.4 kg, SpO2 96 %.  PHYSICAL EXAMINATION:   Physical Exam  GENERAL:  83 y.o.-year-old patient lying in bed in no acute distress.  EYES: Pupils equal, round, reactive to light and accommodation. No scleral icterus. Extraocular muscles intact.  HEENT: Head atraumatic, normocephalic. Oropharynx and nasopharynx clear.  NECK:  Supple, no jugular venous distention. No thyroid enlargement, no tenderness.  LUNGS: Normal breath sounds bilaterally, no wheezing, rales, rhonchi. No use of accessory muscles of respiration.  CARDIOVASCULAR: S1, S2 normal. No murmurs, rubs, or gallops.  ABDOMEN: Soft, nontender, nondistended. Bowel sounds present. No organomegaly or mass.  EXTREMITIES: No cyanosis, clubbing or  edema b/l.    NEUROLOGIC: Cranial nerves II through XII are intact. No focal Motor or sensory deficits b/l. Globally weak.  PSYCHIATRIC: The patient is alert and oriented x 3.  SKIN: No obvious rash, lesion, or ulcer.    LABORATORY PANEL:   CBC Recent Labs  Lab 12/06/18 2344  WBC 9.0  HGB 11.8*  HCT 35.3*  PLT 179   ------------------------------------------------------------------------------------------------------------------  Chemistries  Recent Labs  Lab 12/06/18 2344 12/07/18 0140 12/07/18 0901  NA 122*  --  131*  K 3.3*  --  3.8  CL 90*  --  103  CO2 24  --  23  GLUCOSE 119*  --  103*  BUN 18  --  10  CREATININE 0.72  --  0.65  CALCIUM 8.4*  --  8.1*  MG  --  1.9  --   AST 31  --   --   ALT 29  --   --   ALKPHOS 125  --   --   BILITOT 0.5  --   --    ------------------------------------------------------------------------------------------------------------------  Cardiac Enzymes Recent Labs  Lab 12/06/18 2344  TROPONINI <0.03   ------------------------------------------------------------------------------------------------------------------  RADIOLOGY:  Dg Chest 2 View  Result Date: 12/07/2018 CLINICAL DATA:  Acute onset of generalized weakness. Patient not feeling well. EXAM: CHEST - 2 VIEW COMPARISON:  Chest radiograph performed 06/22/2015 FINDINGS: The lungs are well-aerated and clear. There is no evidence of focal opacification, pleural effusion or pneumothorax. The heart is normal in size; a pacemaker is noted at the left chest wall, with leads ending at the right atrium and right ventricle. No acute osseous abnormalities are seen. IMPRESSION: No acute cardiopulmonary process  seen. Electronically Signed   By: Garald Balding M.D.   On: 12/07/2018 00:24     ASSESSMENT AND PLAN:   83 year old female with past medical history of paroxysmal atrial fibrillation hypertension, hypothyroidism, history of small bowel obstruction, fibrocystic breast disease  who presented to the hospital due to generalized weakness and lethargy.  1.  Generalized weakness/lethargy-secondary to mild dehydration and hyponatremia. - Patient after her colonoscopy apparently was not eating and drinking well and was on hydrochlorothiazide which led to her hyponatremia.  Clinically improved with some gentle IV fluids.  We will get physical therapy to assess mobility.  2.  Hyponatremia-mild, secondary to patient being on hydrochlorothiazide with poor p.o. intake. -Improved with IV fluid hydration we will continue to monitor.  Hold HCTZ for now.  3.  History of paroxysmal atrial fibrillation-rate controlled.  Continue flecainide, Cardizem.  Continue Eliquis.  4.  Hypokalemia-secondary to the dehydration.  Improved with supplementation will continue to monitor.    5.  Hypothyroidism-continue Synthroid.  6.  Essential hypertension-continue Cardizem, patient apparently was on Aldactone and losartan but they were discontinued due to some hyperkalemia and acute kidney injury.  I will start some low-dose Norvasc.   Await PT eval.   All the records are reviewed and case discussed with Care Management/Social Worker. Management plans discussed with the patient, family and they are in agreement.  CODE STATUS: Full code  DVT Prophylaxis: Eliquis  TOTAL TIME TAKING CARE OF THIS PATIENT: 30 minutes.   POSSIBLE D/C IN 1-2 DAYS, DEPENDING ON CLINICAL CONDITION.   Henreitta Leber M.D on 12/07/2018 at 12:02 PM  Between 7am to 6pm - Pager - [phone]  After 6pm go to [email or web address] - Proofreader  [facility]  Office  [phone]  CC: Primary care physician; Rusty Aus, MD

## 2018-12-07 NOTE — H&P (Addendum)
[facility] at [place] NAME: Hailey Simmons    MR#:  [id number]  DATE OF BIRTH:  1936-05-23  DATE OF ADMISSION:  12/06/2018  PRIMARY CARE PHYSICIAN: Rusty Aus, MD   REQUESTING/REFERRING PHYSICIAN: Paulette Blanch, MD  CHIEF COMPLAINT:  No chief complaint on file.  HISTORY OF PRESENT ILLNESS:  Hailey Simmons  is a 83 y.o. female with a known history of HTN, hypothyroidism, Afib (Eliquis), SSS (s/p MedTronic PPM; interrogation 07/29/2018 w/ estimated 6-46yrs generator life remaining) p/w generalized weakness. Pt is AAOx3 and is a good historian. She appears younger than stated age. She is comfortable, well-appearing and in no acute distress. She appears mildly ill, but does not appear toxic. She tells me that she had a colonoscopy with Dr. Gustavo Lah several days ago (12/02/2018). She states she has not felt completely "right" since the procedure, but states that she has felt progressively weaker. Her family states that she has had poor appetite, and has not eaten as much as usual in the past several days. She also endorses dehydration. She has continued to take her medications (including a diuretic, HCTZ). She states she has felt more weak, "rotten" and has had "no energy" since "suppertime" on Friday (12/05/2018), and throughout the day on Saturday (12/06/2018).  Pt's family at bedside notes recent medication adjustments. K+ was 5.9 as outpt (11/13/2018); ARB and Aldactone stopped. K+ improved to 4.0 (11/28/2018). Pt still on HCTZ and Diltiazem. BP 150s/50s at the time of my assessment. Na+ 122 (decreased from 135 on 11/28/2018), Cl- 90, K+ 3.3.  PAST MEDICAL HISTORY:   Past Medical History:  Diagnosis Date  . Asthma 1970's - 1990's   a. ? 2/2 wood stove usage.  . Basal cell carcinoma of skin of nose   . Diverticulosis   . Dysrhythmia    A-FIB  . Fibrocystic breast disease   . Goiter   . History of chicken pox   . Hx of colonic polyps   .  Hypertension   . Hypothyroidism   . Menopause   . PAF (paroxysmal atrial fibrillation) ([facility])    on apixoban  . Sinus node dysfunction (HCC)    a. s/p MDT dual chamber pacemaker  . Small bowel obstruction ([facility]) 08/2009    PAST SURGICAL HISTORY:   Past Surgical History:  Procedure Laterality Date  . COLONOSCOPY    . COLONOSCOPY WITH PROPOFOL N/A 12/02/2018   Procedure: COLONOSCOPY WITH PROPOFOL;  Surgeon: Lollie Sails, MD;  Location: [facility] ENDOSCOPY;  Service: Endoscopy;  Laterality: N/A;  . EP IMPLANTABLE DEVICE N/A 06/22/2015   MDT MRI compatible dual chamber PPM implanted by Dr Caryl Comes for symptomatic bradycardia  . INSERT / REPLACE / REMOVE PACEMAKER      SOCIAL HISTORY:   Social History   Tobacco Use  . Smoking status: Never Smoker  . Smokeless tobacco: Never Used  Substance Use Topics  . Alcohol use: No    FAMILY HISTORY:   Family History  Problem Relation Age of Onset  . Heart attack Father   . Heart failure Mother   . Breast cancer Neg Hx     DRUG ALLERGIES:  No Known Allergies  REVIEW OF SYSTEMS:   Review of Systems  Constitutional: Positive for malaise/fatigue. Negative for chills, diaphoresis, fever and weight loss.  HENT: Negative for congestion, ear pain, hearing loss, nosebleeds, sinus pain, sore throat and tinnitus.   Eyes: Negative for blurred vision, double vision and photophobia.  Respiratory:  Negative for cough, hemoptysis, sputum production, shortness of breath and wheezing.   Cardiovascular: Negative for chest pain, palpitations, orthopnea, claudication, leg swelling and PND.  Gastrointestinal: Negative for abdominal pain, blood in stool, constipation, diarrhea, heartburn, melena, nausea and vomiting.  Genitourinary: Negative for dysuria, frequency, hematuria and urgency.  Musculoskeletal: Negative for back pain, joint pain, myalgias and neck pain.  Skin: Negative for itching and rash.  Neurological: Positive for weakness. Negative for  dizziness, tingling, tremors, sensory change, speech change, focal weakness, seizures, loss of consciousness and headaches.  Psychiatric/Behavioral: Negative for depression and memory loss. The patient is not nervous/anxious and does not have insomnia.    MEDICATIONS AT HOME:   Prior to Admission medications   Medication Sig Start Date End Date Taking? Authorizing Provider  apixaban (ELIQUIS) 5 MG TABS tablet Take 1 tablet (5 mg total) by mouth 2 (two) times daily. Resume 06/26/15 11/21/18  Yes Deboraha Sprang, MD  atorvastatin (LIPITOR) 10 MG tablet TAKE ONE (1) TABLET EACH DAY 01/07/17  Yes Gollan, Kathlene November, MD  calcium carbonate (OS-CAL) 600 MG TABS Take 600 mg by mouth 2 (two) times daily with a meal.   Yes [provider]  Cholecalciferol (VITAMIN D3) 2000 UNITS TABS Take 1 tablet by mouth daily.   Yes [provider]  Cranberry 1000 MG CAPS Take 1 capsule by mouth daily.    Yes [provider]  diltiazem (CARDIZEM CD) 240 MG 24 hr capsule Take 1 capsule (240 mg total) by mouth daily. 08/29/18  Yes [name], MD  flecainide (TAMBOCOR) 100 MG tablet TAKE ONE TABLET TWICE DAILY 01/03/16  Yes Gollan, Kathlene November, MD  hydrochlorothiazide (HYDRODIURIL) 25 MG tablet Take 1 tablet (25 mg total) by mouth daily. 11/21/18 02/19/19 Yes Theora Gianotti, NP  levothyroxine (SYNTHROID, LEVOTHROID) 100 MCG tablet Take 100 mcg by mouth daily.   Yes [provider]  diltiazem (CARDIZEM) 30 MG tablet Take 1 tablet (30 mg total) by mouth 4 (four) times daily as needed. Patient not taking: Reported on 12/07/2018 10/21/13   [name], MD  diltiazem [facility]) 180 MG 24 hr capsule Take 180 mg by mouth daily.    [provider]  ESTRACE VAGINAL 0.1 MG/GM vaginal cream Place 1 Applicatorful vaginally as needed.  05/05/14   [provider]  losartan (COZAAR) 100 MG tablet Take 100 mg by mouth daily.    [provider]   VITAL SIGNS:  Blood  pressure (!) 151/52, pulse 60, temperature 97.6 F (36.4 C), temperature source Oral, resp. rate 17, height 5\' 4"  (1.626 m), weight 74.4 kg, SpO2 95 %.  PHYSICAL EXAMINATION:  Physical Exam Constitutional:      General: She is awake. She is not in acute distress.    Appearance: Normal appearance. She is well-developed and normal weight. She is ill-appearing. She is not toxic-appearing or diaphoretic.     Interventions: She is not intubated. HENT:     Head: Atraumatic.  Eyes:     General: Lids are normal. No scleral icterus.    Extraocular Movements: Extraocular movements intact.     Conjunctiva/sclera: Conjunctivae normal.  Neck:     Musculoskeletal: Neck supple.  Cardiovascular:     Rate and Rhythm: Normal rate and regular rhythm.  No extrasystoles are present.    Heart sounds: Normal heart sounds, S1 normal and S2 normal. Heart sounds not distant. No murmur. No friction rub. No gallop. No S3 or S4 sounds.   Pulmonary:  Effort: No tachypnea, bradypnea, accessory muscle usage, prolonged expiration, respiratory distress or retractions. She is not intubated.     Breath sounds: Normal breath sounds. No stridor, decreased air movement or transmitted upper airway sounds. No decreased breath sounds, wheezing, rhonchi or rales.     Comments: (+) PPM. Abdominal:     General: Bowel sounds are decreased. There is no distension.     Palpations: Abdomen is soft.     Tenderness: There is no abdominal tenderness. There is no guarding or rebound.  Musculoskeletal: Normal range of motion.        General: No swelling or tenderness.     Right lower leg: No edema.     Left lower leg: No edema.  Lymphadenopathy:     Cervical: No cervical adenopathy.  Skin:    General: Skin is warm and dry.     Findings: No erythema or rash.  Neurological:     General: No focal deficit present.     Mental Status: She is alert and oriented to person, place, and time. Mental status is at baseline.  Psychiatric:         Attention and Perception: Attention and perception normal.        Mood and Affect: Mood and affect normal.        Speech: Speech normal.        Behavior: Behavior normal. Behavior is cooperative.        Thought Content: Thought content normal.        Cognition and Memory: Cognition and memory normal.        Judgment: Judgment normal.    LABORATORY PANEL:   CBC Recent Labs  Lab 12/06/18 2344  WBC 9.0  HGB 11.8*  HCT 35.3*  PLT 179   ------------------------------------------------------------------------------------------------------------------  Chemistries  Recent Labs  Lab 12/06/18 2344  NA 122*  K 3.3*  CL 90*  CO2 24  GLUCOSE 119*  BUN 18  CREATININE 0.72  CALCIUM 8.4*  AST 31  ALT 29  ALKPHOS 125  BILITOT 0.5   ------------------------------------------------------------------------------------------------------------------  Cardiac Enzymes Recent Labs  Lab 12/06/18 2344  TROPONINI <0.03   ------------------------------------------------------------------------------------------------------------------  RADIOLOGY:  Dg Chest 2 View  Result Date: 12/07/2018 CLINICAL DATA:  Acute onset of generalized weakness. Patient not feeling well. EXAM: CHEST - 2 VIEW COMPARISON:  Chest radiograph performed 06/22/2015 FINDINGS: The lungs are well-aerated and clear. There is no evidence of focal opacification, pleural effusion or pneumothorax. The heart is normal in size; a pacemaker is noted at the left chest wall, with leads ending at the right atrium and right ventricle. No acute osseous abnormalities are seen. IMPRESSION: No acute cardiopulmonary process seen. Electronically Signed   By: Garald Balding M.D.   On: 12/07/2018 00:24   IMPRESSION AND PLAN:   A/P: 47F HTN, hypothyroidism, Afib (Eliquis), SSS (s/p MedTronic PPM; interrogation 07/29/2018 w/ estimated 6-15yrs generator life remaining) p/w generalized weakness, dehydration, hypovolemic hypochloremic  hyponatremia, hypokalemia. Hyperglycemia, hypocalcemia, normocytic anemia. -Generalized weakness, dehydration, hypovolemic hypochloremic hyponatremia, hypokalemia: Endorses poor PO intake, dehydration. On diuretic (HCTZ). Recent medication changes. Na+ 122 (decreased from 135 on 11/28/2018), Cl- 90, K+ 3.3. This is after ARB and Aldactone were stopped in the outpt setting. Hold Aldactone, restart ARB, D/C HCTZ. Continue Diltiazem. BUN/Cr ratio 18/0.72 = 25, intravascular volume depletion, IVF. Replete K+, monitor. Mag level pending. Other causes for hyponatremia are less likely but still considered. CXR (-) masses. TSH pending. -HTN: K+ was 5.9 as outpt (11/13/2018); ARB and Aldactone stopped.  K+ improved to 4.0 (11/28/2018). Pt still on HCTZ and Diltiazem. BP 150s/50s at the time of my assessment. As noted above, Na+, Cl- and K+ all low. Hold Aldactone, restart ARB, D/C HCTZ. Continue Diltiazem. -Hypocalcemia: Ionized calcium. -Normocytic anemia: Mild. Likely anemia of chronic disease. -c/w other home meds/formulary subs. -FEN/GI: Cardiac diet. -DVT PPx: Eliquis. -Code status: Full code. -Disposition: Observation, < 2 midnights.   Addendum: Mag, Phos, TSH, U/A normal.   All the records are reviewed and case discussed with ED provider. Management plans discussed with the patient, family and they are in agreement.  CODE STATUS: Full code.  TOTAL TIME TAKING CARE OF THIS PATIENT: 75 minutes.    Arta Silence M.D on 12/07/2018 at 1:58 AM  Between 7am to 6pm - Pager - [phone]  After 6pm go to [email or web address] - Proofreader  [facility]  Office  [phone]  CC: Primary care physician; Rusty Aus, MD   Note: This dictation was prepared with Dragon dictation along with smaller phrase technology. Any transcriptional errors that result from this process are unintentional.

## 2018-12-07 NOTE — Evaluation (Signed)
Physical Therapy Evaluation Patient Details Name: Hailey Simmons MRN: [id number] DOB: 1936/07/13 Today's Date: 12/07/2018   History of Present Illness  Hailey Simmons is an 83yo female who comes to [facility] on 12/06/18 after generalized weakness at home, noted to have electrolyte disturbance. PTA pt is a fully independent community dwelling [place] without limitation. Pt reports 1 fall several weeks ago while going to the bathroom at night.   Clinical Impression  Pt admitted with above diagnosis. Pt currently with functional limitations due to the deficits listed below (see "PT Problem List"). Upon entry, pt in bed, awake and agreeable to participate. The pt is alert and oriented x3, pleasant, conversational. Pt reports feeling better compared to PTA. Pt denies any pain at this time. All mobility performed at independent level without any instability, gait speed appropriate for exploring the community afoot. Pt denies any cute deficits or impairments. Gait quality reported as baseline. Patient is at baseline, all education completed, and time is given to address all questions/concerns. No additional skilled PT services needed at this time, PT signing off. PT recommends daily ambulation ad lib or with nursing staff as needed to prevent deconditioning.      Follow Up Recommendations No PT follow up    Equipment Recommendations  None recommended by PT    Recommendations for Other Services       Precautions / Restrictions Precautions Precautions: Fall Restrictions Weight Bearing Restrictions: No      Mobility  Bed Mobility Overal bed mobility: Independent                Transfers Overall transfer level: Independent                  Ambulation/Gait Ambulation/Gait assistance: Independent Gait Distance (Feet): 350 Feet Assistive device: None Gait Pattern/deviations: WFL(Within Functional Limits) Gait velocity: 1.61m/s       Stairs            Wheelchair Mobility     Modified Rankin (Stroke Patients Only)       Balance Overall balance assessment: No apparent balance deficits (not formally assessed);Independent;History of Falls                                           Pertinent Vitals/Pain Pain Assessment: No/denies pain    Home Living Family/patient expects to be discharged to:: Private residence Living Arrangements: Spouse/significant other Available Help at Discharge: Family Type of Home: House Home Access: Stairs to enter   Technical brewer of Steps: 1 Home Layout: One level Home Equipment: None      Prior Function Level of Independence: Independent         Comments: Still drive, cooks, etc. 1 fall in December going to the bathrom in the night. This was her first fall.      Hand Dominance   Dominant Hand: Right    Extremity/Trunk Assessment   Upper Extremity Assessment Upper Extremity Assessment: Overall WFL for tasks assessed    Lower Extremity Assessment Lower Extremity Assessment: Overall WFL for tasks assessed       Communication      Cognition Arousal/Alertness: Awake/alert Behavior During Therapy: WFL for tasks assessed/performed Overall Cognitive Status: Within Functional Limits for tasks assessed  General Comments      Exercises     Assessment/Plan    PT Assessment Patent does not need any further PT services  PT Problem List         PT Treatment Interventions      PT Goals (Current goals can be found in the Care Plan section)  Acute Rehab PT Goals PT Goal Formulation: All assessment and education complete, DC therapy    Frequency     Barriers to discharge        Co-evaluation               AM-PAC PT "6 Clicks" Mobility  Outcome Measure Help needed turning from your back to your side while in a flat bed without using bedrails?: None Help needed moving from lying on your back to sitting on the  side of a flat bed without using bedrails?: None Help needed moving to and from a bed to a chair (including a wheelchair)?: None Help needed standing up from a chair using your arms (e.g., wheelchair or bedside chair)?: None Help needed to walk in hospital room?: None Help needed climbing 3-5 steps with a railing? : A Little 6 Click Score: 23    End of Session   Activity Tolerance: Patient tolerated treatment well Patient left: in chair;with call bell/phone within reach Nurse Communication: Mobility status PT Visit Diagnosis: Unsteadiness on feet (R26.81);Other abnormalities of gait and mobility (R26.89)    Time: 0093-8182 PT Time Calculation (min) (ACUTE ONLY): 13 min   Charges:   PT Evaluation $PT Eval Low Complexity: 1 Low          4:55 PM, 12/07/18 Etta Grandchild, PT, DPT Physical Therapist - [facility]  [phone] ([place])    , C 12/07/2018, 4:52 PM

## 2018-12-07 NOTE — ED Provider Notes (Signed)
[facility] Emergency Department Provider Note   ____________________________________________   First MD Initiated Contact with Patient 12/07/18 [phone]     (approximate)  I have reviewed the triage vital signs and the nursing notes.   HISTORY  Chief Complaint Weakness   HPI VANA ARIF is a 83 y.o. female who presents to the ED via EMS from home with a chief complaint of generalized weakness since yesterday.  Patient reports her potassium has been high over the last several weeks and her doctors have been adjusting her medications, taking her off of Spironolactone and ARB.  Patient had a colonoscopy 4 days ago and states she has felt weak since but weakness increased yesterday and now she can barely stand.  Denies associated fever, chills, chest pain, shortness of breath, abdominal pain, nausea, vomiting, bloody stools, dysuria, diarrhea.  Denies recent travel or trauma.   Past Medical History:  Diagnosis Date  . Asthma 1970's - 1990's   a. ? 2/2 wood stove usage.  . Basal cell carcinoma of skin of nose   . Diverticulosis   . Dysrhythmia    A-FIB  . Fibrocystic breast disease   . Goiter   . History of chicken pox   . Hx of colonic polyps   . Hypertension   . Hypothyroidism   . Menopause   . PAF (paroxysmal atrial fibrillation) ([place])    on apixoban  . Sinus node dysfunction (H[facility])    a. s/p MDT dual chamber pacemaker  . Small bowel obstruction ([place]) 08/2009    Patient Active Problem List   Diagnosis Date Noted  . Neck pain 05/13/2017  . Sinus node dysfunction ([place]) 06/22/2015  . PAF (paroxysmal atrial fibrillation) ([place]) 06/11/2014  . URI (upper respiratory infection) 10/21/2013  . Mixed hyperlipidemia 02/06/2012    Past Surgical History:  Procedure Laterality Date  . COLONOSCOPY    . COLONOSCOPY WITH PROPOFOL N/A 12/02/2018   Procedure: COLONOSCOPY WITH PROPOFOL;  Surgeon: Lollie Sails, MD;  Location[facility]COPY;  Service:  Endoscopy;  Laterality: N/A;  . EP IMPLANTABLE DEVICE N/A 06/22/2015   MDT MRI compatible dual chamber PPM implanted by Dr Caryl Comes for symptomatic bradycardia  . INSERT / REPLACE / REMOVE PACEMAKER      Prior to Admission medications   Medication Sig Start Date End Date Taking? Authorizing Provider  apixaban (ELIQUIS) 5 MG TABS tablet Take 1 tablet (5 mg total) by mouth 2 (two) times daily. Resume 06/26/15 11/21/18   Deboraha Sprang, MD  atorvastatin (LIPITOR) 10 MG tablet TAKE ONE (1) TABLET EACH DAY 01/07/17   [name], MD  calcium carbonate (OS-CAL) 600 MG TABS Take 600 mg by mouth 2 (two) times daily with a meal.    [provider]  Cholecalciferol (VITAMIN D3) 2000 UNITS TABS Take 1 tablet by mouth daily.    [provider]  Cranberry 1000 MG CAPS Take 1 capsule by mouth daily.     [provider]  diltiazem (CARDIZEM CD) 240 MG 24 hr capsule Take 1 capsule (240 mg total) by mouth daily. 08/29/18   [name], MD  diltiazem (CARDIZEM) 30 MG tablet Take 1 tablet (30 mg total) by mouth 4 (four) times daily as needed. 10/21/13   [name], MD  diltiazem (TIAZAC) 180 MG 24 hr capsule Take 180 mg by mouth daily.    [provider]  ESTRACE VAGINAL 0.1 MG/GM vaginal cream Place 1 Applicatorful vaginally as needed.  05/05/14  [provider]  flecainide (TAMBOCOR) 100 MG tablet TAKE ONE TABLET TWICE DAILY 01/03/16   [name], MD  hydrochlorothiazide (HYDRODIURIL) 25 MG tablet Take 1 tablet (25 mg total) by mouth daily. 11/21/18 02/19/19  Theora Gianotti, NP  levothyroxine (SYNTHROID, LEVOTHROID) 100 MCG tablet Take 100 mcg by mouth daily.    [provider]  losartan (COZAAR) 100 MG tablet Take 100 mg by mouth daily.    [provider]    Allergies Patient has no known allergies.  Family History  Problem Relation Age of Onset  . Heart attack Father   . Heart failure Mother   . Breast cancer Neg  Hx     Social History Social History   Tobacco Use  . Smoking status: Never Smoker  . Smokeless tobacco: Never Used  Substance Use Topics  . Alcohol use: No  . Drug use: No    Review of Systems  Constitutional: Positive for generalized weakness.  No fever/chills Eyes: No visual changes. ENT: No sore throat. Cardiovascular: Denies chest pain. Respiratory: Denies shortness of breath. Gastrointestinal: No abdominal pain.  No nausea, no vomiting.  No diarrhea.  No constipation. Genitourinary: Negative for dysuria. Musculoskeletal: Negative for back pain. Skin: Negative for rash. Neurological: Negative for headaches, focal weakness or numbness.   ____________________________________________   PHYSICAL EXAM:  VITAL SIGNS: ED Triage Vitals  Enc Vitals Group     BP 12/06/18 2330 (!) 165/55     Pulse Rate 12/06/18 2333 66     Resp 12/06/18 2333 15     Temp 12/06/18 2333 97.6 F (36.4 C)     Temp Source 12/06/18 2333 Oral     SpO2 12/06/18 2333 97 %     Weight 12/06/18 2335 164 lb (74.4 kg)     Height 12/06/18 2335 5\' 4"  (1.626 m)     Head Circumference --      Peak Flow --      Pain Score 12/06/18 2334 0     Pain Loc --      Pain Edu? --      Excl. in [place]? --     Constitutional: Alert and oriented. Weak appearing and in no acute distress. Eyes: Conjunctivae are normal. PERRL. EOMI. Head: Atraumatic. Nose: No congestion/rhinnorhea. Mouth/Throat: Mucous membranes are moist.  Oropharynx non-erythematous. Neck: No stridor.   Cardiovascular: Normal rate, regular rhythm. Grossly normal heart sounds.  Good peripheral circulation. Respiratory: Normal respiratory effort.  No retractions. Lungs CTAB. Gastrointestinal: Soft and nontender to light or deep palpation. No distention. No abdominal bruits. No CVA tenderness. Musculoskeletal: No lower extremity tenderness nor edema.  No joint effusions. Neurologic:  Normal speech and language. No gross focal neurologic deficits are  appreciated.  Too weak to stand.   Skin:  Skin is warm, dry and intact. No rash noted. Psychiatric: Mood and affect are normal. Speech and behavior are normal.  ____________________________________________   LABS (all labs ordered are listed, but only abnormal results are displayed)  Labs Reviewed  CBC WITH DIFFERENTIAL/PLATELET - Abnormal; Notable for the following components:      Result Value   Hemoglobin 11.8 (*)    HCT 35.3 (*)    All other components within normal limits  COMPREHENSIVE METABOLIC PANEL - Abnormal; Notable for the following components:   Sodium 122 (*)    Potassium 3.3 (*)    Chloride 90 (*)    Glucose, Bld 119 (*)    Calcium 8.4 (*)    All  other components within normal limits  TROPONIN I  URINALYSIS, COMPLETE (UACMP) WITH MICROSCOPIC  MAGNESIUM   ____________________________________________  EKG  ED ECG REPORT I, SUNG,JADE J, the attending physician, personally viewed and interpreted this ECG.   Date: 12/07/2018  EKG Time: 2334  Rate: 66  Rhythm: normal EKG, normal sinus rhythm  Axis: Normal  Intervals:right bundle branch block  ST&T Change: Nonspecific  ____________________________________________  RADIOLOGY  ED MD interpretation: No acute cardiopulmonary process  Official radiology report(s): Dg Chest 2 View  Result Date: 12/07/2018 CLINICAL DATA:  Acute onset of generalized weakness. Patient not feeling well. EXAM: CHEST - 2 VIEW COMPARISON:  Chest radiograph performed 06/22/2015 FINDINGS: The lungs are well-aerated and clear. There is no evidence of focal opacification, pleural effusion or pneumothorax. The heart is normal in size; a pacemaker is noted at the left chest wall, with leads ending at the right atrium and right ventricle. No acute osseous abnormalities are seen. IMPRESSION: No acute cardiopulmonary process seen. Electronically Signed   By: Garald Balding M.D.   On: 12/07/2018 00:24     ____________________________________________   PROCEDURES  Procedure(s) performed: None  Procedures  Critical Care performed: No  ____________________________________________   INITIAL IMPRESSION / ASSESSMENT AND PLAN / ED COURSE  As part of my medical decision making, I reviewed the following data within the [place] History obtained from family, Nursing notes reviewed and incorporated, Labs reviewed, EKG interpreted, Old chart reviewed, Radiograph reviewed, Discussed with admitting physician and Notes from prior ED visits   83 year old female who presents with generalized weakness.  Differential diagnosis includes but is not limited to metabolic, electrolyte, infectious etiologies, etc.  Laboratory results remarkable for hyponatremia and mild hypokalemia.  Trending her lab work, her sodium was 128 3 weeks ago and that was the lowest it has been over the past several years.  Will administer IV fluids, replete potassium orally check, check magnesium.  Discussed with hospitalist to evaluate patient in the emergency department for admission.      ____________________________________________   FINAL CLINICAL IMPRESSION(S) / ED DIAGNOSES  Final diagnoses:  Weakness generalized  Hyponatremia  Hypokalemia     ED Discharge Orders    None       Note:  This document was prepared using Dragon voice recognition software and may include unintentional dictation errors.    Paulette Blanch, MD 12/07/18 [phone]

## 2018-12-08 DIAGNOSIS — E860 Dehydration: Secondary | ICD-10-CM | POA: Diagnosis not present

## 2018-12-08 DIAGNOSIS — E878 Other disorders of electrolyte and fluid balance, not elsewhere classified: Secondary | ICD-10-CM | POA: Diagnosis not present

## 2018-12-08 DIAGNOSIS — E871 Hypo-osmolality and hyponatremia: Secondary | ICD-10-CM | POA: Diagnosis not present

## 2018-12-08 DIAGNOSIS — E876 Hypokalemia: Secondary | ICD-10-CM | POA: Diagnosis not present

## 2018-12-08 LAB — BASIC METABOLIC PANEL
Anion gap: 6 (ref 5–15)
BUN: 11 mg/dL (ref 8–23)
CO2: 23 mmol/L (ref 22–32)
CREATININE: 0.79 mg/dL (ref 0.44–1.00)
Calcium: 8.3 mg/dL — ABNORMAL LOW (ref 8.9–10.3)
Chloride: 102 mmol/L (ref 98–111)
GFR calc Af Amer: >60 mL/min (ref >60)
GFR calc non Af Amer: >60 mL/min (ref >60)
Glucose, Bld: 96 mg/dL (ref 70–99)
Potassium: 4.3 mmol/L (ref 3.5–5.1)
Sodium: 131 mmol/L — ABNORMAL LOW (ref 135–145)

## 2018-12-08 LAB — CALCIUM, IONIZED: Calcium, Ionized, Serum: 4.4 mg/dL — ABNORMAL LOW (ref 4.5–5.6)

## 2018-12-08 MED ORDER — LISINOPRIL 5 MG PO TABS: 5.0000 mg | ORAL_TABLET | Freq: Every day | ORAL | 11 refills | Status: DC

## 2018-12-08 NOTE — Telephone Encounter (Signed)
I called and spoke with staff at [facility]. I advised that Benicar was stopped on 11/21/18 due to hyperkalemia.

## 2018-12-08 NOTE — Telephone Encounter (Signed)
Pt c/o medication issue:  1. Name of Medication: benicar   2. How are you currently taking this medication (dosage and times per day)? Please advise   3. Are you having a reaction (difficulty breathing--STAT)? no  4. What is your medication issue? [facility] calling to confirm dose

## 2018-12-08 NOTE — Progress Notes (Signed)
Received MD order to discharge patient to home, reviewed home meds, discharge instructions and follow up appointments with patient and husband and both verbalized understanding

## 2018-12-08 NOTE — Discharge Summary (Signed)
[facility] [place] NAME: Hailey Simmons    MR#:  [id number]  DATE OF BIRTH:  [birth year]  DATE OF ADMISSION:  12/06/2018 ADMITTING PHYSICIAN: Arta Silence, MD  DATE OF DISCHARGE: 12/08/2018   PRIMARY CARE PHYSICIAN: Rusty Aus, MD    ADMISSION DIAGNOSIS:  Hypokalemia [E87.6] Hyponatremia [E87.1] Weakness generalized [R53.1]  DISCHARGE DIAGNOSIS:  Active Problems:   Generalized weakness   SECONDARY DIAGNOSIS:   Past Medical History:  Diagnosis Date  . Asthma 1970's - 1990's   a. ? 2/2 wood stove usage.  . Basal cell carcinoma of skin of nose   . Diverticulosis   . Dysrhythmia    A-FIB  . Fibrocystic breast disease   . Goiter   . History of chicken pox   . Hx of colonic polyps   . Hypertension   . Hypothyroidism   . Menopause   . PAF (paroxysmal atrial fibrillation) ([place])    on apixoban  . Sinus node dysfunction (HCC)    a. s/p MDT dual chamber pacemaker  . Small bowel obstruction ([place]) 08/2009    HOSPITAL COURSE:   83 year old female with past medical history of paroxysmal atrial fibrillation hypertension, hypothyroidism, history of small bowel obstruction, fibrocystic breast disease who presented to the hospital due to generalized weakness and lethargy.  1.  Generalized weakness/lethargy-secondary to mild dehydration and hyponatremia. - Patient after her colonoscopy apparently was not eating and drinking well and was on hydrochlorothiazide which led to her hyponatremia.  Clinically improved with some gentle IV fluids.  We will get physical therapy to assess mobility.  She was able to walk fine.  2.  Hyponatremia-mild, secondary to patient being on hydrochlorothiazide with poor p.o. intake. -Improved with IV fluid hydration we will continue to monitor.  Hold HCTZ for now. - follow with PMD in 1 week.  3.  History of paroxysmal atrial fibrillation-rate controlled.  Continue flecainide, Cardizem.   Continue Eliquis.  4.  Hypokalemia-secondary to the dehydration.  Improved with supplementation will continue to monitor.    5.  Hypothyroidism-continue Synthroid. TSH normal.  6.  Essential hypertension-continue Cardizem, patient apparently was on Aldactone and losartan but they were discontinued due to some hyperkalemia and acute kidney injury. Added small dose Lisinopril to help BP control.  DISCHARGE CONDITIONS:   Stable.  CONSULTS OBTAINED:  Treatment Team:  Arta Silence, MD  DRUG ALLERGIES:  No Known Allergies  DISCHARGE MEDICATIONS:   Allergies as of 12/08/2018   No Known Allergies     Medication List    STOP taking these medications   diltiazem 180 MG 24 hr capsule Commonly known as:  TIAZAC   diltiazem 30 MG tablet Commonly known as:  CARDIZEM   hydrochlorothiazide 25 MG tablet Commonly known as:  HYDRODIURIL   losartan 100 MG tablet Commonly known as:  COZAAR     TAKE these medications   apixaban 5 MG Tabs tablet Commonly known as:  ELIQUIS Take 1 tablet (5 mg total) by mouth 2 (two) times daily. Resume 06/26/15   atorvastatin 10 MG tablet Commonly known as:  LIPITOR TAKE ONE (1) TABLET EACH DAY   calcium carbonate 600 MG Tabs tablet Commonly known as:  OS-CAL Take 600 mg by mouth 2 (two) times daily with a meal.   Cranberry 1000 MG Caps Take 1 capsule by mouth daily.   diltiazem 240 MG 24 hr capsule Commonly known as:  CARDIZEM CD Take 1 capsule (240 mg total) by mouth  daily.   ESTRACE VAGINAL 0.1 MG/GM vaginal cream Generic drug:  estradiol Place 1 Applicatorful vaginally as needed.   flecainide 100 MG tablet Commonly known as:  TAMBOCOR TAKE ONE TABLET TWICE DAILY   levothyroxine 100 MCG tablet Commonly known as:  SYNTHROID, LEVOTHROID Take 100 mcg by mouth daily.   lisinopril 5 MG tablet Commonly known as:  PRINIVIL,ZESTRIL Take 1 tablet (5 mg total) by mouth daily.   Vitamin D3 50 MCG (2000 UT) Tabs Take 1 tablet by  mouth daily.        DISCHARGE INSTRUCTIONS:   Follow with PMD in 1 week for Na, K and BP.  If you experience worsening of your admission symptoms, develop shortness of breath, life threatening emergency, suicidal or homicidal thoughts you must seek medical attention immediately by calling 911 or calling your MD immediately  if symptoms less severe.  You Must read complete instructions/literature along with all the possible adverse reactions/side effects for all the Medicines you take and that have been prescribed to you. Take any new Medicines after you have completely understood and accept all the possible adverse reactions/side effects.   Please note  You were cared for by a hospitalist during your hospital stay. If you have any questions about your discharge medications or the care you received while you were in the hospital after you are discharged, you can call the unit and asked to speak with the hospitalist on call if the hospitalist that took care of you is not available. Once you are discharged, your primary care physician will handle any further medical issues. Please note that NO REFILLS for any discharge medications will be authorized once you are discharged, as it is imperative that you return to your primary care physician (or establish a relationship with a primary care physician if you do not have one) for your aftercare needs so that they can reassess your need for medications and monitor your lab values.    Today   CHIEF COMPLAINT:  No chief complaint on file.   HISTORY OF PRESENT ILLNESS:  Hailey Simmons  is a 83 y.o. female with a known history of HTN, hypothyroidism, Afib (Eliqu[facility]), SSS (s/p MedTronic PPM; interrogation 07/29/2018 w/ estimated 6-63yrs generator life remaining) p/w generalized weakness. Pt is AAOx3 and is a good historian. She appears younger than stated age. She is comfortable, well-appearing and in no acute distress. She appears mildly ill, but does not  appear toxic. She tells me that she had a colonoscopy with Dr. Gustavo Lah several days ago (12/02/2018). She states she has not felt completely "right" since the procedure, but states that she has felt progressively weaker. Her family states that she has had poor appetite, and has not eaten as much as usual in the past several days. She also endorses dehydration. She has continued to take her medications (including a diuretic, HCTZ). She states she has felt more weak, "rotten" and has had "no energy" since "suppertime" on Friday (12/05/2018), and throughout the day on Saturday (12/06/2018).  Pt's family at bedside notes recent medication adjustments. K+ was 5.9 as outpt (11/13/2018); ARB and Aldactone stopped. K+ improved to 4.0 (11/28/2018). Pt still on HCTZ and Diltiazem. BP 150s/50s at the time of my assessment. Na+ 122 (decreased from 135 on 11/28/2018), Cl- 90, K+ 3.3.   VITAL SIGNS:  Blood pressure (!) 120/47, pulse 62, temperature 97.6 F (36.4 C), temperature source Oral, resp. rate 20, height 5\' 4"  (1.626 m), weight 74.4 kg, SpO2 97 %.  I/O:  Intake/Output Summary (Last 24 hours) at 12/08/2018 1315 Last data filed at 12/08/2018 0936 Gross per 24 hour  Intake 863 ml  Output 1 ml  Net 862 ml    PHYSICAL EXAMINATION:  GENERAL:  83 y.o.-year-old patient lying in the bed with no acute distress.  EYES: Pupils equal, round, reactive to light and accommodation. No scleral icterus. Extraocular muscles intact.  HEENT: Head atraumatic, normocephalic. Oropharynx and nasopharynx clear.  NECK:  Supple, no jugular venous distention. No thyroid enlargement, no tenderness.  LUNGS: Normal breath sounds bilaterally, no wheezing, rales,rhonchi or crepitation. No use of accessory muscles of respiration.  CARDIOVASCULAR: S1, S2 normal. No murmurs, rubs, or gallops.  ABDOMEN: Soft, non-tender, non-distended. Bowel sounds present. No organomegaly or mass.  EXTREMITIES: No pedal edema, cyanosis, or  clubbing.  NEUROLOGIC: Cranial nerves II through XII are intact. Muscle strength 5/5 in all extremities. Sensation intact. Gait not checked.  PSYCHIATRIC: The patient is alert and oriented x 3.  SKIN: No obvious rash, lesion, or ulcer.   DATA REVIEW:   CBC Recent Labs  Lab 12/06/18 2344  WBC 9.0  HGB 11.8*  HCT 35.3*  PLT 179    Chemistries  Recent Labs  Lab 12/06/18 2344 12/07/18 0140  12/08/18 0311  NA 122*  --    < > 131*  K 3.3*  --    < > 4.3  CL 90*  --    < > 102  CO2 24  --    < > 23  GLUCOSE 119*  --    < > 96  BUN 18  --    < > 11  CREATININE 0.72  --    < > 0.79  CALCIUM 8.4*  --    < > 8.3*  MG  --  1.9  --   --   AST 31  --   --   --   ALT 29  --   --   --   ALKPHOS 125  --   --   --   BILITOT 0.5  --   --   --    < > = values in this interval not displayed.    Cardiac Enzymes Recent Labs  Lab 12/06/18 2344  TROPONINI <0.03    Microbiology Results  Results for orders placed or performed during the hospital encounter of 06/22/15  Surgical pcr screen     Status: None   Collection Time: 06/22/15  6:57 AM  Result Value Ref Range Status   MRSA, PCR NEGATIVE NEGATIVE Final   Staphylococcus aureus NEGATIVE NEGATIVE Final    Comment:        The Xpert SA Assay (FDA approved for NASAL specimens in patients over 55 years of age), is one component of a comprehensive surveillance program.  Test performance has been validated by [facility] for patients greater than or equal to 52 year old. It is not intended to diagnose infection nor to guide or monitor treatment.     RADIOLOGY:  Dg Chest 2 View  Result Date: 12/07/2018 CLINICAL DATA:  Acute onset of generalized weakness. Patient not feeling well. EXAM: CHEST - 2 VIEW COMPARISON:  Chest radiograph performed 06/22/2015 FINDINGS: The lungs are well-aerated and clear. There is no evidence of focal opacification, pleural effusion or pneumothorax. The heart is normal in size; a pacemaker is noted at  the left chest wall, with leads ending at the right atrium and right ventricle. No acute osseous abnormalities are seen. IMPRESSION: No  acute cardiopulmonary process seen. Electronically Signed   By: Garald Balding M.D.   On: 12/07/2018 00:24    EKG:   Orders placed or performed during the hospital encounter of 12/06/18  . EKG 12-Lead  . EKG 12-Lead      Management plans discussed with the patient, family and they are in agreement.  CODE STATUS:     Code Status Orders  (From admission, onward)         Start     Ordered   12/07/18 0231  Full code  Continuous     12/07/18 0230        Code Status History    Date Active Date Inactive Code Status Order ID Comments User Context   06/22/2015 1224 06/23/2015 1312 Full Code [id number]  Deboraha Sprang, MD Inpatient      TOTAL TIME TAKING CARE OF THIS PATIENT: 35 minutes.    Vaughan Basta M.D on 12/08/2018 at 1:15 PM  Between 7am to 6pm - Pager - [phone]  After 6pm go to [email or web address] - password [email or web address] [facility]  Office  [phone]  CC: Primary care physician; Rusty Aus, MD   Note: This dictation was prepared with Dragon dictation along with smaller phrase technology. Any transcriptional errors that result from this process are unintentional.

## 2018-12-15 DIAGNOSIS — E871 Hypo-osmolality and hyponatremia: Secondary | ICD-10-CM | POA: Diagnosis not present

## 2018-12-15 DIAGNOSIS — D369 Benign neoplasm, unspecified site: Secondary | ICD-10-CM | POA: Diagnosis not present

## 2018-12-15 LAB — CUP PACEART REMOTE DEVICE CHECK
Battery Remaining Longevity: 91 mo
Battery Voltage: 3.02 V
Brady Statistic AP VP Percent: 0.05 %
Brady Statistic AS VP Percent: 0.02 %
Brady Statistic AS VS Percent: 21.53 %
Brady Statistic RA Percent Paced: 78.44 %
Brady Statistic RV Percent Paced: 0.07 %
Date Time Interrogation Session: 20191117210548
Implantable Lead Implant Date: 20160720
Implantable Lead Implant Date: 20160720
Implantable Lead Location: 753860
Implantable Lead Model: 5076
Implantable Lead Model: 5076
Implantable Pulse Generator Implant Date: 20160720
Lead Channel Impedance Value: 361 Ohm
Lead Channel Impedance Value: 494 Ohm
Lead Channel Impedance Value: 513 Ohm
Lead Channel Impedance Value: 551 Ohm
Lead Channel Pacing Threshold Amplitude: 0.75 V
Lead Channel Pacing Threshold Amplitude: 1 V
Lead Channel Pacing Threshold Pulse Width: 0.4 ms
Lead Channel Pacing Threshold Pulse Width: 0.4 ms
Lead Channel Sensing Intrinsic Amplitude: 1.375 mV
Lead Channel Sensing Intrinsic Amplitude: 1.375 mV
Lead Channel Sensing Intrinsic Amplitude: 8.375 mV
Lead Channel Setting Pacing Amplitude: 2 V
Lead Channel Setting Pacing Pulse Width: 0.4 ms
Lead Channel Setting Sensing Sensitivity: 1.2 mV
MDC IDC LEAD LOCATION: 753859
MDC IDC MSMT LEADCHNL RV SENSING INTR AMPL: 8.375 mV
MDC IDC SET LEADCHNL RV PACING AMPLITUDE: 2.5 V
MDC IDC STAT BRADY AP VS PERCENT: 78.41 %

## 2018-12-26 DIAGNOSIS — H2513 Age-related nuclear cataract, bilateral: Secondary | ICD-10-CM | POA: Diagnosis not present

## 2018-12-26 DIAGNOSIS — H02839 Dermatochalasis of unspecified eye, unspecified eyelid: Secondary | ICD-10-CM | POA: Diagnosis not present

## 2018-12-26 DIAGNOSIS — H18413 Arcus senilis, bilateral: Secondary | ICD-10-CM | POA: Diagnosis not present

## 2019-01-01 DIAGNOSIS — Z85828 Personal history of other malignant neoplasm of skin: Secondary | ICD-10-CM | POA: Diagnosis not present

## 2019-01-01 DIAGNOSIS — D1801 Hemangioma of skin and subcutaneous tissue: Secondary | ICD-10-CM | POA: Diagnosis not present

## 2019-01-01 DIAGNOSIS — L900 Lichen sclerosus et atrophicus: Secondary | ICD-10-CM | POA: Diagnosis not present

## 2019-01-01 DIAGNOSIS — Z08 Encounter for follow-up examination after completed treatment for malignant neoplasm: Secondary | ICD-10-CM | POA: Diagnosis not present

## 2019-01-01 DIAGNOSIS — L821 Other seborrheic keratosis: Secondary | ICD-10-CM | POA: Diagnosis not present

## 2019-01-09 DIAGNOSIS — Z961 Presence of intraocular lens: Secondary | ICD-10-CM | POA: Diagnosis not present

## 2019-01-15 ENCOUNTER — Ambulatory Visit (INDEPENDENT_AMBULATORY_CARE_PROVIDER_SITE_OTHER): Payer: PPO

## 2019-01-15 DIAGNOSIS — I495 Sick sinus syndrome: Secondary | ICD-10-CM

## 2019-01-16 DIAGNOSIS — R739 Hyperglycemia, unspecified: Secondary | ICD-10-CM | POA: Diagnosis not present

## 2019-01-16 DIAGNOSIS — E039 Hypothyroidism, unspecified: Secondary | ICD-10-CM | POA: Diagnosis not present

## 2019-01-16 DIAGNOSIS — E782 Mixed hyperlipidemia: Secondary | ICD-10-CM | POA: Diagnosis not present

## 2019-01-16 LAB — CUP PACEART REMOTE DEVICE CHECK
Battery Remaining Longevity: 91 mo
Battery Voltage: 3.02 V
Brady Statistic AP VS Percent: 57.33 %
Brady Statistic AS VP Percent: 0.02 %
Brady Statistic AS VS Percent: 42.62 %
Brady Statistic RV Percent Paced: 0.06 %
Date Time Interrogation Session: 20200214094118
Implantable Lead Implant Date: 20160720
Implantable Lead Implant Date: 20160720
Implantable Lead Location: 753859
Implantable Lead Model: 5076
Implantable Pulse Generator Implant Date: 20160720
Lead Channel Impedance Value: 361 Ohm
Lead Channel Impedance Value: 475 Ohm
Lead Channel Impedance Value: 494 Ohm
Lead Channel Impedance Value: 513 Ohm
Lead Channel Pacing Threshold Amplitude: 0.75 V
Lead Channel Pacing Threshold Amplitude: 0.875 V
Lead Channel Pacing Threshold Pulse Width: 0.4 ms
Lead Channel Pacing Threshold Pulse Width: 0.4 ms
Lead Channel Sensing Intrinsic Amplitude: 1.125 mV
Lead Channel Sensing Intrinsic Amplitude: 1.125 mV
Lead Channel Sensing Intrinsic Amplitude: 7.75 mV
Lead Channel Sensing Intrinsic Amplitude: 7.75 mV
Lead Channel Setting Pacing Amplitude: 2 V
Lead Channel Setting Pacing Amplitude: 2.5 V
Lead Channel Setting Sensing Sensitivity: 1.2 mV
MDC IDC LEAD LOCATION: 753860
MDC IDC SET LEADCHNL RV PACING PULSEWIDTH: 0.4 ms
MDC IDC STAT BRADY AP VP PERCENT: 0.04 %
MDC IDC STAT BRADY RA PERCENT PACED: 57.36 %

## 2019-01-19 DIAGNOSIS — J40 Bronchitis, not specified as acute or chronic: Secondary | ICD-10-CM | POA: Diagnosis not present

## 2019-01-19 DIAGNOSIS — J45902 Unspecified asthma with status asthmaticus: Secondary | ICD-10-CM | POA: Diagnosis not present

## 2019-01-21 DIAGNOSIS — H2513 Age-related nuclear cataract, bilateral: Secondary | ICD-10-CM | POA: Diagnosis not present

## 2019-01-21 DIAGNOSIS — H2511 Age-related nuclear cataract, right eye: Secondary | ICD-10-CM | POA: Diagnosis not present

## 2019-01-22 DIAGNOSIS — E782 Mixed hyperlipidemia: Secondary | ICD-10-CM | POA: Diagnosis not present

## 2019-01-22 DIAGNOSIS — I480 Paroxysmal atrial fibrillation: Secondary | ICD-10-CM | POA: Diagnosis not present

## 2019-01-22 DIAGNOSIS — Z0000 Encounter for general adult medical examination without abnormal findings: Secondary | ICD-10-CM | POA: Diagnosis not present

## 2019-01-27 NOTE — Progress Notes (Signed)
Remote pacemaker transmission.   

## 2019-02-01 HISTORY — PX: CATARACT EXTRACTION: SUR2

## 2019-02-09 DIAGNOSIS — H2511 Age-related nuclear cataract, right eye: Secondary | ICD-10-CM | POA: Diagnosis not present

## 2019-02-10 DIAGNOSIS — H25042 Posterior subcapsular polar age-related cataract, left eye: Secondary | ICD-10-CM | POA: Diagnosis not present

## 2019-02-10 DIAGNOSIS — H2512 Age-related nuclear cataract, left eye: Secondary | ICD-10-CM | POA: Diagnosis not present

## 2019-02-10 DIAGNOSIS — H02834 Dermatochalasis of left upper eyelid: Secondary | ICD-10-CM | POA: Diagnosis not present

## 2019-02-27 ENCOUNTER — Other Ambulatory Visit: Payer: Self-pay | Admitting: Cardiovascular Disease

## 2019-04-03 HISTORY — PX: CATARACT EXTRACTION: SUR2

## 2019-04-16 ENCOUNTER — Ambulatory Visit (INDEPENDENT_AMBULATORY_CARE_PROVIDER_SITE_OTHER): Payer: PPO | Admitting: *Deleted

## 2019-04-16 ENCOUNTER — Other Ambulatory Visit: Payer: Self-pay

## 2019-04-16 DIAGNOSIS — I495 Sick sinus syndrome: Secondary | ICD-10-CM

## 2019-04-16 LAB — CUP PACEART REMOTE DEVICE CHECK
Battery Remaining Longevity: 82 mo
Battery Voltage: 3.02 V
Brady Statistic AP VP Percent: 0.03 %
Brady Statistic AP VS Percent: 59.98 %
Brady Statistic AS VP Percent: 0.01 %
Brady Statistic AS VS Percent: 39.97 %
Brady Statistic RA Percent Paced: 60.01 %
Brady Statistic RV Percent Paced: 0.05 %
Date Time Interrogation Session: 20200514152625
Implantable Lead Implant Date: 20160720
Implantable Lead Implant Date: 20160720
Implantable Lead Location: 753859
Implantable Lead Location: 753860
Implantable Lead Model: 5076
Implantable Lead Model: 5076
Implantable Pulse Generator Implant Date: 20160720
Lead Channel Impedance Value: 361 Ohm
Lead Channel Impedance Value: 494 Ohm
Lead Channel Impedance Value: 532 Ohm
Lead Channel Impedance Value: 551 Ohm
Lead Channel Pacing Threshold Amplitude: 0.75 V
Lead Channel Pacing Threshold Amplitude: 1.125 V
Lead Channel Pacing Threshold Pulse Width: 0.4 ms
Lead Channel Pacing Threshold Pulse Width: 0.4 ms
Lead Channel Sensing Intrinsic Amplitude: 2.25 mV
Lead Channel Sensing Intrinsic Amplitude: 2.25 mV
Lead Channel Sensing Intrinsic Amplitude: 8.125 mV
Lead Channel Sensing Intrinsic Amplitude: 8.125 mV
Lead Channel Setting Pacing Amplitude: 2.25 V
Lead Channel Setting Pacing Amplitude: 2.5 V
Lead Channel Setting Pacing Pulse Width: 0.4 ms
Lead Channel Setting Sensing Sensitivity: 1.2 mV

## 2019-04-23 ENCOUNTER — Encounter: Payer: Self-pay | Admitting: Cardiology

## 2019-04-23 NOTE — Progress Notes (Signed)
Remote pacemaker transmission.   

## 2019-04-29 DIAGNOSIS — H2512 Age-related nuclear cataract, left eye: Secondary | ICD-10-CM | POA: Diagnosis not present

## 2019-06-26 ENCOUNTER — Inpatient Hospital Stay
Admission: EM | Admit: 2019-06-26 | Discharge: 2019-06-29 | DRG: 641 | Disposition: A | Discharge status: Home / Self Care | Payer: PPO | Source: Ambulatory Visit | Attending: Internal Medicine | Admitting: Internal Medicine

## 2019-06-26 ENCOUNTER — Encounter: Payer: Self-pay | Admitting: Emergency Medicine

## 2019-06-26 ENCOUNTER — Other Ambulatory Visit: Payer: Self-pay

## 2019-06-26 DIAGNOSIS — Z79890 Hormone replacement therapy (postmenopausal): Secondary | ICD-10-CM

## 2019-06-26 DIAGNOSIS — T502X5A Adverse effect of carbonic-anhydrase inhibitors, benzothiadiazides and other diuretics, initial encounter: Secondary | ICD-10-CM | POA: Diagnosis present

## 2019-06-26 DIAGNOSIS — Z85828 Personal history of other malignant neoplasm of skin: Secondary | ICD-10-CM

## 2019-06-26 DIAGNOSIS — E039 Hypothyroidism, unspecified: Secondary | ICD-10-CM | POA: Diagnosis present

## 2019-06-26 DIAGNOSIS — E861 Hypovolemia: Secondary | ICD-10-CM | POA: Diagnosis not present

## 2019-06-26 DIAGNOSIS — Z8249 Family history of ischemic heart disease and other diseases of the circulatory system: Secondary | ICD-10-CM | POA: Diagnosis not present

## 2019-06-26 DIAGNOSIS — I480 Paroxysmal atrial fibrillation: Secondary | ICD-10-CM | POA: Diagnosis present

## 2019-06-26 DIAGNOSIS — Z950 Presence of cardiac pacemaker: Secondary | ICD-10-CM

## 2019-06-26 DIAGNOSIS — Z20828 Contact with and (suspected) exposure to other viral communicable diseases: Secondary | ICD-10-CM | POA: Diagnosis not present

## 2019-06-26 DIAGNOSIS — Z86010 Personal history of colonic polyps: Secondary | ICD-10-CM | POA: Diagnosis not present

## 2019-06-26 DIAGNOSIS — I495 Sick sinus syndrome: Secondary | ICD-10-CM | POA: Diagnosis present

## 2019-06-26 DIAGNOSIS — E860 Dehydration: Secondary | ICD-10-CM | POA: Diagnosis present

## 2019-06-26 DIAGNOSIS — I10 Essential (primary) hypertension: Secondary | ICD-10-CM | POA: Diagnosis not present

## 2019-06-26 DIAGNOSIS — Z79899 Other long term (current) drug therapy: Secondary | ICD-10-CM

## 2019-06-26 DIAGNOSIS — J449 Chronic obstructive pulmonary disease, unspecified: Secondary | ICD-10-CM | POA: Diagnosis not present

## 2019-06-26 DIAGNOSIS — E871 Hypo-osmolality and hyponatremia: Secondary | ICD-10-CM | POA: Diagnosis not present

## 2019-06-26 DIAGNOSIS — E782 Mixed hyperlipidemia: Secondary | ICD-10-CM | POA: Diagnosis present

## 2019-06-26 DIAGNOSIS — Z7901 Long term (current) use of anticoagulants: Secondary | ICD-10-CM | POA: Diagnosis not present

## 2019-06-26 DIAGNOSIS — Y929 Unspecified place or not applicable: Secondary | ICD-10-CM

## 2019-06-26 DIAGNOSIS — R531 Weakness: Secondary | ICD-10-CM

## 2019-06-26 LAB — URINALYSIS, COMPLETE (UACMP) WITH MICROSCOPIC
Bacteria, UA: NONE SEEN
Bilirubin Urine: NEGATIVE
Glucose, UA: NEGATIVE mg/dL
Hgb urine dipstick: NEGATIVE
Ketones, ur: NEGATIVE mg/dL
Nitrite: NEGATIVE
Protein, ur: NEGATIVE mg/dL
Specific Gravity, Urine: 1.009 (ref 1.005–1.030)
Squamous Epithelial / HPF: NONE SEEN (ref 0–5)
pH: 5 (ref 5.0–8.0)

## 2019-06-26 LAB — BASIC METABOLIC PANEL
Anion gap: 13 (ref 5–15)
Anion gap: 11 (ref 5–15)
BUN: 8 mg/dL (ref 8–23)
BUN: 7 mg/dL — ABNORMAL LOW (ref 8–23)
CO2: 23 mmol/L (ref 22–32)
CO2: 24 mmol/L (ref 22–32)
Calcium: 9.1 mg/dL (ref 8.9–10.3)
Calcium: 8.7 mg/dL — ABNORMAL LOW (ref 8.9–10.3)
Chloride: 82 mmol/L — ABNORMAL LOW (ref 98–111)
Chloride: 85 mmol/L — ABNORMAL LOW (ref 98–111)
Creatinine, Ser: 0.61 mg/dL (ref 0.44–1.00)
Creatinine, Ser: 0.46 mg/dL (ref 0.44–1.00)
GFR calc Af Amer: >60 mL/min (ref >60)
GFR calc Af Amer: >60 mL/min (ref >60)
GFR calc non Af Amer: >60 mL/min (ref >60)
GFR calc non Af Amer: >60 mL/min (ref >60)
Glucose, Bld: 162 mg/dL — ABNORMAL HIGH (ref 70–99)
Glucose, Bld: 125 mg/dL — ABNORMAL HIGH (ref 70–99)
Potassium: 3.7 mmol/L (ref 3.5–5.1)
Potassium: 3.6 mmol/L (ref 3.5–5.1)
Sodium: 118 mmol/L — CL (ref 135–145)
Sodium: 120 mmol/L — ABNORMAL LOW (ref 135–145)

## 2019-06-26 LAB — CBC
HCT: 37.2 % (ref 36.0–46.0)
Hemoglobin: 12.9 g/dL (ref 12.0–15.0)
MCH: 29.1 pg (ref 26.0–34.0)
MCHC: 34.7 g/dL (ref 30.0–36.0)
MCV: 83.8 fL (ref 80.0–100.0)
Platelets: 220 10*3/uL (ref 150–400)
RBC: 4.44 MIL/uL (ref 3.87–5.11)
RDW: 11.7 % (ref 11.5–15.5)
WBC: 13.5 10*3/uL — ABNORMAL HIGH (ref 4.0–10.5)
nRBC: 0 % (ref 0.0–0.2)

## 2019-06-26 LAB — SARS CORONAVIRUS 2 BY RT PCR (HOSPITAL ORDER, PERFORMED IN ~~LOC~~ HOSPITAL LAB): SARS Coronavirus 2: NEGATIVE

## 2019-06-26 MED ORDER — VITAMIN D3 25 MCG (1000 UNIT) PO TABS
2000.0000 [IU] | ORAL_TABLET | Freq: Every day | ORAL | Status: DC
  Administered 2019-06-27 – 2019-06-29 (×3): 2000 [IU] via ORAL
  Filled 2019-06-26 (×6): qty 2

## 2019-06-26 MED ORDER — CALCIUM CARBONATE ANTACID 500 MG PO CHEW
500.0000 mg | CHEWABLE_TABLET | Freq: Two times a day (BID) | ORAL | Status: DC
  Administered 2019-06-27 – 2019-06-29 (×5): 500 mg via ORAL
  Filled 2019-06-26 (×5): qty 3

## 2019-06-26 MED ORDER — DILTIAZEM HCL ER COATED BEADS 240 MG PO CP24
240.0000 mg | ORAL_CAPSULE | Freq: Every day | ORAL | Status: DC
  Filled 2019-06-26: qty 1

## 2019-06-26 MED ORDER — FLECAINIDE ACETATE 100 MG PO TABS
100.0000 mg | ORAL_TABLET | Freq: Two times a day (BID) | ORAL | Status: DC
  Administered 2019-06-26 – 2019-06-29 (×6): 100 mg via ORAL
  Filled 2019-06-26 (×7): qty 1

## 2019-06-26 MED ORDER — ATORVASTATIN CALCIUM 20 MG PO TABS
10.0000 mg | ORAL_TABLET | Freq: Every day | ORAL | Status: DC
  Administered 2019-06-26 – 2019-06-28 (×3): 10 mg via ORAL
  Filled 2019-06-26 (×3): qty 1

## 2019-06-26 MED ORDER — ESTRADIOL 0.1 MG/GM VA CREA
1.0000 | TOPICAL_CREAM | Freq: Every day | VAGINAL | Status: DC
  Filled 2019-06-26: qty 42.5

## 2019-06-26 MED ORDER — DILTIAZEM HCL ER COATED BEADS 120 MG PO CP24
240.0000 mg | ORAL_CAPSULE | Freq: Every day | ORAL | Status: DC
  Administered 2019-06-26 – 2019-06-28 (×3): 240 mg via ORAL
  Filled 2019-06-26 (×3): qty 2

## 2019-06-26 MED ORDER — SODIUM CHLORIDE 0.9 % IV BOLUS
500.0000 mL | Freq: Once | INTRAVENOUS | Status: AC
  Administered 2019-06-26: 500 mL via INTRAVENOUS

## 2019-06-26 MED ORDER — SODIUM CHLORIDE 0.9% FLUSH: 3.0000 mL | Freq: Once | INTRAVENOUS | Status: DC

## 2019-06-26 MED ORDER — SODIUM CHLORIDE 0.9 % IV SOLN
INTRAVENOUS | Status: DC
  Administered 2019-06-26 – 2019-06-29 (×5): via INTRAVENOUS

## 2019-06-26 MED ORDER — APIXABAN 5 MG PO TABS
5.0000 mg | ORAL_TABLET | Freq: Two times a day (BID) | ORAL | Status: DC
  Administered 2019-06-26 – 2019-06-29 (×6): 5 mg via ORAL
  Filled 2019-06-26 (×6): qty 1

## 2019-06-26 MED ORDER — LEVOTHYROXINE SODIUM 50 MCG PO TABS
100.0000 ug | ORAL_TABLET | Freq: Every day | ORAL | Status: DC
  Administered 2019-06-27 – 2019-06-29 (×3): 100 ug via ORAL
  Filled 2019-06-26 (×3): qty 2

## 2019-06-26 MED ORDER — LISINOPRIL 5 MG PO TABS
5.0000 mg | ORAL_TABLET | Freq: Every day | ORAL | Status: DC
  Administered 2019-06-27 – 2019-06-29 (×3): 5 mg via ORAL
  Filled 2019-06-26 (×3): qty 1

## 2019-06-26 NOTE — ED Notes (Addendum)
ED TO INPATIENT HANDOFF REPORT  ED Nurse Name and Phone #: Karena Addison [phone]  S Name/Age/Gender Hailey Simmons 83 y.o. female Room/Bed: ED10A/ED10A  Code Status   Code Status: Full Code  Home/SNF/Other Home Patient oriented to: self, place, time and situation Is this baseline? Yes   Triage Complete: Triage complete  Chief Complaint Sent from [facility]- Na 118  Triage Note Pt to ED via POV, pt was sent by [facility]. Pt was seen by Boykin Reaper, P.A-C. For weakness and feeling "washed out". Lab work was done and patients sodium was 118. Pt is in NAD at this time. Pt is A & O x 4    Allergies No Known Allergies  Level of Care/Admitting Diagnosis ED Disposition    ED Disposition Condition [facility] Area: [place] [[id number]]  Level of Care: Med-Surg [16]  Covid Evaluation: Person Under Investigation (PUI)  Diagnosis: Acute hyponatremia [517001]  Admitting Physician: Eula Flax  Attending Physician: Rufina Falco ACHIENG [[id number]]  Estimated length of stay: past midnight tomorrow  Certification:: I certify this patient will need inpatient services for at least 2 midnights  PT Class (Do Not Modify): Inpatient [101]  PT Acc Code (Do Not Modify): Private [1]       B Medical/Surgery History Past Medical History:  Diagnosis Date  . Asthma 1970's - 1990's   a. ? 2/2 wood stove usage.  . Basal cell carcinoma of skin of nose   . Diverticulosis   . Dysrhythmia    A-FIB  . Fibrocystic breast disease   . Goiter   . History of chicken pox   . Hx of colonic polyps   . Hypertension   . Hypothyroidism   . Menopause   . PAF (paroxysmal atrial fibrillation) ([place])    on apixoban  . Sinus node dysfunction (HCC)    a. s/p MDT dual chamber pacemaker  . Small bowel obstruction ([place]) 08/2009   Past Surgical History:  Procedure Laterality Date  . COLONOSCOPY    . COLONOSCOPY WITH PROPOFOL N/A 12/02/2018   Procedure:  COLONOSCOPY WITH PROPOFOL;  Surgeon: Lollie Sails, MD;  [facility];  Service: Endoscopy;  Laterality: N/A;  . EP IMPLANTABLE DEVICE N/A 06/22/2015   MDT MRI compatible dual chamber PPM implanted by Dr Caryl Comes for symptomatic bradycardia  . INSERT / REPLACE / [place]       A IV Location/Drains/Wounds Patient Lines/Drains/Airways Status   Active Line/Drains/Airways    Name:   Placement date:   Placement time:   Site:   Days:   Peripheral IV 06/26/19 Left Hand   06/26/19    1726    Hand   less than 1   Incision (Closed) 06/22/15 Chest Left   06/22/15    -     1465          Intake/Output Last 24 hours  Intake/Output Summary (Last 24 hours) at 06/26/2019 1912 Last data filed at 06/26/2019 1837 Gross per 24 hour  Intake 500 ml  Output -  Net 500 ml    Labs/Imaging Results for orders placed or performed during the hospital encounter of 06/26/19 (from the past 48 hour(s))  Basic metabolic panel     Status: Abnormal   Collection Time: 06/26/19  4:48 PM  Result Value Ref Range   Sodium 118 (LL) 135 - 145 mmol/L    Comment: CRITICAL RESULT CALLED TO, READ BACK BY AND VERIFIED WITH ANNIE SMITH 06/26/19 @  1724  MLK    Potassium 3.7 3.5 - 5.1 mmol/L   Chloride 82 (L) 98 - 111 mmol/L   CO2 23 22 - 32 mmol/L   Glucose, Bld 162 (H) 70 - 99 mg/dL   BUN 8 8 - 23 mg/dL   Creatinine, Ser 0.61 0.44 - 1.00 mg/dL   Calcium 9.1 8.9 - 10.3 mg/dL   GFR calc non Af Amer >60 >60 mL/min   GFR calc Af Amer >60 >60 mL/min   Anion gap 13 5 - 15    Comment: Performed at [facility], [address] [place], [place] [address]  CBC     Status: Abnormal   Collection Time: 06/26/19  4:48 PM  Result Value Ref Range   WBC 13.5 (H) 4.0 - 10.5 K/uL   RBC 4.44 3.87 - 5.11 MIL/uL   Hemoglobin 12.9 12.0 - 15.0 g/dL   HCT 37.2 36.0 - 46.0 %   MCV 83.8 80.0 - 100.0 fL   MCH 29.1 26.0 - 34.0 pg   MCHC 34.7 30.0 - 36.0 g/dL   RDW 11.7 11.5 - 15.5 %   Platelets 220 150 -  400 K/uL   nRBC 0.0 0.0 - 0.2 %    Comment: Performed at [facility], [address] [place], [place] [address]  Urinalysis, Complete w Microscopic     Status: Abnormal   Collection Time: 06/26/19  4:48 PM  Result Value Ref Range   Color, Urine STRAW (A) YELLOW   APPearance HAZY (A) CLEAR   Specific Gravity, Urine 1.009 1.005 - 1.030   pH 5.0 5.0 - 8.0   Glucose, UA NEGATIVE NEGATIVE mg/dL   Hgb urine dipstick NEGATIVE NEGATIVE   Bilirubin Urine NEGATIVE NEGATIVE   Ketones, ur NEGATIVE NEGATIVE mg/dL   Protein, ur NEGATIVE NEGATIVE mg/dL   Nitrite NEGATIVE NEGATIVE   Leukocytes,Ua TRACE (A) NEGATIVE   WBC, UA 0-5 0 - 5 WBC/hpf   Bacteria, UA NONE SEEN NONE SEEN   Squamous Epithelial / LPF NONE SEEN 0 - 5    Comment: Performed at [facility], [address] [place], [place] [address]  SARS Coronavirus 2 (CEPHEID - Performed in [place] hospital lab), Hosp Order     Status: None   Collection Time: 06/26/19  5:31 PM   Specimen: Nasopharyngeal Swab  Result Value Ref Range   SARS Coronavirus 2 NEGATIVE NEGATIVE    Comment: (NOTE) If result is NEGATIVE SARS-CoV-2 target nucleic acids are NOT DETECTED. The SARS-CoV-2 RNA is generally detectable in upper and lower  respiratory specimens during the acute phase of infection. The lowest  concentration of SARS-CoV-2 viral copies this assay can detect is 250  copies / mL. A negative result does not preclude SARS-CoV-2 infection  and should not be used as the sole basis for treatment or other  patient management decisions.  A negative result may occur with  improper specimen collection / handling, submission of specimen other  than nasopharyngeal swab, presence of viral mutation(s) within the  areas targeted by this assay, and inadequate number of viral copies  (<250 copies / mL). A negative result must be combined with clinical  observations, patient history, and epidemiological information. If result is  POSITIVE SARS-CoV-2 target nucleic acids are DETECTED. The SARS-CoV-2 RNA is generally detectable in upper and lower  respiratory specimens dur ing the acute phase of infection.  Positive  results are indicative of active infection with SARS-CoV-2.  Clinical  correlation with patient history  and other diagnostic information is  necessary to determine patient infection status.  Positive results do  not rule out bacterial infection or co-infection with other viruses. If result is PRESUMPTIVE POSTIVE SARS-CoV-2 nucleic acids MAY BE PRESENT.   A presumptive positive result was obtained on the submitted specimen  and confirmed on repeat testing.  While 2019 novel coronavirus  (SARS-CoV-2) nucleic acids may be present in the submitted sample  additional confirmatory testing may be necessary for epidemiological  and / or clinical management purposes  to differentiate between  SARS-CoV-2 and other Sarbecovirus currently known to infect humans.  If clinically indicated additional testing with an alternate test  methodology [phone]) is advised. The SARS-CoV-2 RNA is generally  detectable in upper and lower respiratory sp ecimens during the acute  phase of infection. The expected result is Negative. Fact Sheet for Patients:  StrictlyIdeas.no Fact Sheet for Healthcare Providers: BankingDealers.co.za This test is not yet approved or cleared by the [place] FDA and has been authorized for detection and/or diagnosis of SARS-CoV-2 by FDA under an Emergency Use Authorization (EUA).  This EUA will remain in effect (meaning this test can be used) for the duration of the COVID-19 declaration under Section 564(b)(1) of the Act, 21 U.S.C. section 360bbb-3(b)(1), unless the authorization is terminated or revoked sooner. Performed at [facility], [address] [place], [place] [address]    No results found.  Pending Labs Unresulted Labs  (From admission, onward)   None      Vitals/Pain Today's Vitals   06/26/19 1745 06/26/19 1800 06/26/19 1800 06/26/19 1815  BP:  (!) 180/69    Pulse: 69 73  69  Resp: 15 15  17   SpO2: 97% 100%  98%  Weight:      Height:      PainSc:   0-No pain     Isolation Precautions No active isolations  Medications Medications  sodium chloride flush (NS) 0.9 % injection 3 mL (has no administration in time range)  atorvastatin (LIPITOR) tablet 10 mg (has no administration in time range)  diltiazem (CARDIZEM CD) 24 hr capsule 240 mg (has no administration in time range)  flecainide (TAMBOCOR) tablet 100 mg (has no administration in time range)  lisinopril (ZESTRIL) tablet 5 mg (has no administration in time range)  levothyroxine (SYNTHROID) tablet 100 mcg (has no administration in time range)  estradiol (ESTRACE) vaginal cream 1 Applicatorful (has no administration in time range)  apixaban (ELIQUIS) tablet 5 mg (has no administration in time range)  Vitamin D3 TABS 1 tablet (has no administration in time range)  calcium carbonate (OS-CAL) tablet 600 mg (has no administration in time range)  sodium chloride 0.9 % bolus 500 mL (0 mLs Intravenous Stopped 06/26/19 1837)    Mobility walks Low fall risk   Focused Assessments    R Recommendations: See Admitting Provider Note  Report given to: Wendelyn Breslow, RN

## 2019-06-26 NOTE — ED Triage Notes (Signed)
Pt to ED via POV, pt was sent by [facility]. Pt was seen by Boykin Reaper, P.A-C. For weakness and feeling "washed out". Lab work was done and patients sodium was 118. Pt is in NAD at this time. Pt is A & O x 4

## 2019-06-26 NOTE — H&P (Signed)
[facility] [place] NAME: Hailey Simmons    MR#:  [id number]  DATE OF BIRTH:  24-Jul-1936  DATE OF ADMISSION:  06/26/2019  PRIMARY CARE PHYSICIAN: Rusty Aus, MD   REQUESTING/REFERRING PHYSICIAN:   CHIEF COMPLAINT:   Chief Complaint  Patient presents with  . Weakness    HISTORY OF PRESENT ILLNESS:   83 year old female with a known history of HTN, hypothyroidism, Afib (Eliquis), SSS (s/p MedTronic PPM, diverticulosis, p/w generalized weakness.  Patient state she called her PCP today due to worsening generalized weakness and not feeling overall well, she states that the last time she felt this way was in February when her sodium was noted to be very low.  She denies associated symptoms of nausea or vomiting, abdominal pain, shortness of breath, chest pain, diarrhea, dizziness, or headache.  She had lab work done at her PCPs office today and was noted to have low sodium of 118 therefore she was sent to the ED for further management.  On arrival to the ED, he was afebrile with blood pressure 180/75 mm Hg and pulse rate 107 beats/min. There were no focal neurological deficits; she was alert and oriented x 4 but very lethargic.  Initial labs revealed sodium of 118, glucose 162, WBC 13.5, UA with trace leuks.  She will be admitted under hospitalist service for management of hyponatremia.  PAST MEDICAL HISTORY:   Past Medical History:  Diagnosis Date  . Asthma 1970's - 1990's   a. ? 2/2 wood stove usage.  . Basal cell carcinoma of skin of nose   . Diverticulosis   . Dysrhythmia    A-FIB  . Fibrocystic breast disease   . Goiter   . History of chicken pox   . Hx of colonic polyps   . Hypertension   . Hypothyroidism   . Menopause   . PAF (paroxysmal atrial fibrillation) ([place])    on apixoban  . Sinus node dysfunction (HCC)    a. s/p MDT dual chamber pacemaker  . Small bowel obstruction ([place]) 08/2009    PAST SURGICAL HISTORY:    Past Surgical History:  Procedure Laterality Date  . COLONOSCOPY    . COLONOSCOPY WITH PROPOFOL N/A 12/02/2018   Procedure: COLONOSCOPY WITH PROPOFOL;  Surgeon: Lollie Sails, MD;  Location: [facility] ENDOSCOPY;  Service: Endoscopy;  Laterality: N/A;  . EP IMPLANTABLE DEVICE N/A 06/22/2015   MDT MRI compatible dual chamber PPM implanted by Dr Caryl Comes for symptomatic bradycardia  . INSERT / REPLACE / REMOVE PACEMAKER      SOCIAL HISTORY:   Social History   Tobacco Use  . Smoking status: Never Smoker  . Smokeless tobacco: Never Used  Substance Use Topics  . Alcohol use: No    FAMILY HISTORY:   Family History  Problem Relation Age of Onset  . Heart attack Father   . Heart failure Mother   . Breast cancer Neg Hx     DRUG ALLERGIES:  No Known Allergies  REVIEW OF SYSTEMS:   Review of Systems  Constitutional: Positive for malaise/fatigue. Negative for chills, fever and weight loss.  HENT: Negative for congestion, hearing loss and sore throat.   Eyes: Negative for blurred vision and double vision.  Respiratory: Negative for cough, shortness of breath and wheezing.   Cardiovascular: Negative for chest pain, palpitations, orthopnea and leg swelling.  Gastrointestinal: Negative for abdominal pain, diarrhea, nausea and vomiting.  Genitourinary: Negative for dysuria and urgency.  Musculoskeletal: Negative  for myalgias.  Skin: Negative for rash.  Neurological: Positive for weakness. Negative for dizziness, sensory change, speech change, focal weakness and headaches.  Psychiatric/Behavioral: Negative for depression.   MEDICATIONS AT HOME:   Prior to Admission medications   Medication Sig Start Date End Date Taking? Authorizing Provider  apixaban (ELIQUIS) 5 MG TABS tablet Take 1 tablet (5 mg total) by mouth 2 (two) times daily. Resume 06/26/15 11/21/18  Yes Deboraha Sprang, MD  atorvastatin (LIPITOR) 10 MG tablet TAKE ONE (1) TABLET EACH DAY 01/07/17  Yes Gollan, Kathlene November, MD   calcium carbonate (OS-CAL) 600 MG TABS Take 600 mg by mouth 2 (two) times daily with a meal.   Yes [provider]  Cholecalciferol (VITAMIN D3) 2000 UNITS TABS Take 1 tablet by mouth daily.   Yes [provider]  Cranberry 1000 MG CAPS Take 1 capsule by mouth daily.    Yes [provider]  diltiazem (CARDIZEM CD) 240 MG 24 hr capsule TAKE 1 CAPSULE BY MOUTH ONCE DAILY 02/27/19  Yes Gollan, Kathlene November, MD  ESTRACE VAGINAL 0.1 MG/GM vaginal cream Place 1 Applicatorful vaginally as needed.  05/05/14  Yes [provider]  flecainide (TAMBOCOR) 100 MG tablet TAKE ONE TABLET TWICE DAILY 01/03/16  Yes Gollan, Kathlene November, MD  furosemide (LASIX) 20 MG tablet Take 10 mg by mouth daily.   Yes [provider]  hydrochlorothiazide (HYDRODIURIL) 12.5 MG tablet Take 12.5 mg by mouth daily.   Yes [provider]  levothyroxine (SYNTHROID, LEVOTHROID) 100 MCG tablet Take 100 mcg by mouth daily.   Yes [provider]  polyethylene glycol (MIRALAX / GLYCOLAX) 17 g packet Take 17 g by mouth daily.   Yes [provider]  sodium bicarbonate 650 MG tablet Take 650 mg by mouth daily.   Yes [provider]  lisinopril (PRINIVIL,ZESTRIL) 5 MG tablet Take 1 tablet (5 mg total) by mouth daily. Patient not taking: Reported on 06/26/2019 12/08/18 12/08/19  Vaughan Basta, MD      VITAL SIGNS:  Blood pressure (!) 180/69, pulse 69, resp. rate 17, height 5\' 3"  (1.6 m), weight 74.8 kg, SpO2 98 %.  PHYSICAL EXAMINATION:   Physical Exam  GENERAL:  83 y.o.-year-old patient lying in the bed ill-appearing.  EYES: Pupils equal, round, reactive to light and accommodation. No scleral icterus. Extraocular muscles intact.  HEENT: Head atraumatic, normocephalic. Oropharynx and nasopharynx clear.  NECK:  Supple, no jugular venous distention. No thyroid enlargement, no tenderness.  LUNGS: Normal breath sounds bilaterally, no wheezing, rales,rhonchi or  crepitation. No use of accessory muscles of respiration.  CARDIOVASCULAR: S1, S2 normal. No murmurs, rubs, or gallops.  ABDOMEN: Soft, nontender, nondistended. Bowel sounds present. No organomegaly or mass.  EXTREMITIES: No pedal edema, cyanosis, or clubbing. No rash or lesions. + pedal pulses MUSCULOSKELETAL: Normal bulk, and power was 5+ grip and elbow, knee, and ankle flexion and extension bilaterally.  NEUROLOGIC:Alert and oriented x 3. CN 2-12 intact. Sensation to light touch and cold stimuli intact bilaterally. Finger to nose nl. Babinski is downgoing. DTR's (biceps, patellar, and achilles) 2+ and symmetric throughout. Gait not tested due to safety concern. PSYCHIATRIC: The patient is alert and oriented x 3.  SKIN: No obvious rash, lesion, or ulcer.   DATA REVIEWED:  LABORATORY PANEL:   CBC Recent Labs  Lab 06/26/19 1648  WBC 13.5*  HGB 12.9  HCT 37.2  PLT 220   ------------------------------------------------------------------------------------------------------------------  Chemistries  Recent Labs  Lab 06/26/19 1648  NA 118*  K 3.7  CL 82*  CO2 23  GLUCOSE 162*  BUN 8  CREATININE 0.61  CALCIUM 9.1   ------------------------------------------------------------------------------------------------------------------  Cardiac Enzymes No results for input(s): TROPONINI in the last 168 hours. ------------------------------------------------------------------------------------------------------------------  RADIOLOGY:  No results found.  EKG:  EKG: normal EKG, normal sinus rhythm, unchanged from previous tracings. Vent. rate 67 BPM PR interval * ms QRS duration 167 ms QT/QTc 468/495 ms P-R-T axes 39 74 55  IMPRESSION AND PLAN:   83 y.o. female with past medical history of paroxysmal atrial fibrillation, hypertension, hypothyroidism, history of small bowel obstruction, fibrocystic breast disease who presented to the hospital due to generalized weakness and  lethargy  1. Generalized weakness -likely secondary to mild dehydration and hyponatremia - Admit to MedSurg unit - PT to eval once stable  2. Hyponatremia -likely due to diuretics as patient with recent med changes - Check cortisol, TSH, serum osmolality, sodium osmolality - Hold Lasix and HCTZ - Start IVFs with NS with goal serum sodium level not > 10 to 12 mEq per L in the first 24 hours and 18 mEq per L in the first 48 hours - Check serial sodium - Nephrology consult.  Message sent to [facility] via haiku  3. Paroxysmal atrial fibrillation -rate controlled - Continue Cardizem+ flecainide - Continue Eliquis for anticoagulation  4. Hypothyroidism - Continue Synthroid  5. HLD  + Goal LDL<100 - Atorvastatin 10mg  PO qhs  6. HTN  + Goal BP <140/80 - Continue lisinopril and Cardizem as above   All the records are reviewed and case discussed with ED provider. Management plans discussed with the patient, family and they are in agreement.  CODE STATUS: FULL  TOTAL TIME TAKING CARE OF THIS PATIENT: 50 minutes.     06/26/2019 at 7:24 PM  Rufina Falco, DNP, FNP-BC [facility] Nurse Practitioner Between 7am to 6pm - Pager [phone]  After 6pm go to [email or web address] - password [email or web address] [facility]  Office  [phone]  CC: Primary care physician; Rusty Aus, MD

## 2019-06-26 NOTE — ED Provider Notes (Signed)
[facility] Emergency Department Provider Note  ____________________________________________  Time seen: Approximately 5:58 PM  I have reviewed the triage vital signs and the nursing notes.   HISTORY  Chief Complaint Weakness    HPI Hailey Simmons is a 83 y.o. female with a history of diverticulosis hypertension paroxysmal atrial fibrillation on apixaban who comes the ED today due to generalized weakness and fatigue that started gradually yesterday, worsening and constant.  Denies any pain or fever or chills.   Notably she was started on Lasix for hypertension about 3 weeks ago which she has been taking on an as-needed basis.     Past Medical History:  Diagnosis Date  . Asthma 1970's - 1990's   a. ? 2/2 wood stove usage.  . Basal cell carcinoma of skin of nose   . Diverticulosis   . Dysrhythmia    A-FIB  . Fibrocystic breast disease   . Goiter   . History of chicken pox   . Hx of colonic polyps   . Hypertension   . Hypothyroidism   . Menopause   . PAF (paroxysmal atrial fibrillation) ([place])    on apixoban  . Sinus node dysfunction (HCC)    a. s/p MDT dual chamber pacemaker  . Small bowel obstruction ([place]) 08/2009     Patient Active Problem List   Diagnosis Date Noted  . Generalized weakness 12/07/2018  . Neck pain 05/13/2017  . Sinus node dysfunction ([place]) 06/22/2015  . PAF (paroxysmal atrial fibrillation) ([place]) 06/11/2014  . URI (upper respiratory infection) 10/21/2013  . Mixed hyperlipidemia 02/06/2012     Past Surgical History:  Procedure Laterality Date  . COLONOSCOPY    . COLONOSCOPY WITH PROPOFOL N/A 12/02/2018   Procedure: COLONOSCOPY WITH PROPOFOL;  Surgeon: Lollie Sails, MD;  Location: [facility] ENDOSCOPY;  Service: Endoscopy;  Laterality: N/A;  . EP IMPLANTABLE DEVICE N/A 06/22/2015   MDT MRI compatible dual chamber PPM implanted by Dr Caryl Comes for symptomatic bradycardia  . INSERT / REPLACE / REMOVE PACEMAKER       Prior  to Admission medications   Medication Sig Start Date End Date Taking? Authorizing Provider  apixaban (ELIQUIS) 5 MG TABS tablet Take 1 tablet (5 mg total) by mouth 2 (two) times daily. Resume 06/26/15 11/21/18   Deboraha Sprang, MD  atorvastatin (LIPITOR) 10 MG tablet TAKE ONE (1) TABLET EACH DAY 01/07/17   [name], MD  calcium carbonate (OS-CAL) 600 MG TABS Take 600 mg by mouth 2 (two) times daily with a meal.    [provider]  Cholecalciferol (VITAMIN D3) 2000 UNITS TABS Take 1 tablet by mouth daily.    [provider]  Cranberry 1000 MG CAPS Take 1 capsule by mouth daily.     [provider]  diltiazem (CARDIZEM CD) 240 MG 24 hr capsule TAKE 1 CAPSULE BY MOUTH ONCE DAILY 02/27/19   [name], MD  ESTRACE VAGINAL 0.1 MG/GM vaginal cream Place 1 Applicatorful vaginally as needed.  05/05/14   [provider]  flecainide (TAMBOCOR) 100 MG tablet TAKE ONE TABLET TWICE DAILY 01/03/16   [name], MD  levothyroxine (SYNTHROID, LEVOTHROID) 100 MCG tablet Take 100 mcg by mouth daily.    [provider]  lisinopril (PRINIVIL,ZESTRIL) 5 MG tablet Take 1 tablet (5 mg total) by mouth daily. 12/08/18 12/08/19  Vaughan Basta, MD     Allergies Patient has no known allergies.   Family History  Problem Relation Age of Onset  .  Heart attack Father   . Heart failure Mother   . Breast cancer Neg Hx     Social History Social History   Tobacco Use  . Smoking status: Never Smoker  . Smokeless tobacco: Never Used  Substance Use Topics  . Alcohol use: No  . Drug use: No    Review of Systems  Constitutional:   No fever or chills.  ENT:   No sore throat. No rhinorrhea. Cardiovascular:   No chest pain or syncope. Respiratory:   No dyspnea or cough. Gastrointestinal:   Negative for abdominal pain, vomiting and diarrhea.  Musculoskeletal:   Negative for focal pain or swelling All other systems reviewed and are negative except as  documented above in ROS and HPI.  ____________________________________________   PHYSICAL EXAM:  VITAL SIGNS: ED Triage Vitals  Enc Vitals Group     BP 06/26/19 1647 (!) 180/75     Pulse Rate 06/26/19 1648 67     Resp 06/26/19 1648 (!) 23     Temp --      Temp src --      SpO2 06/26/19 1648 99 %     Weight 06/26/19 1641 165 lb (74.8 kg)     Height 06/26/19 1641 5\' 3"  (1.6 m)     Head Circumference --      Peak Flow --      Pain Score 06/26/19 1641 0     Pain Loc --      Pain Edu? --      Excl. in [place]? --     Vital signs reviewed, nursing assessments reviewed.   Constitutional:   Alert and oriented. Non-toxic appearance. Eyes:   Conjunctivae are normal. EOMI. PERRL. ENT      Head:   Normocephalic and atraumatic.       Neck:   No meningismus. Full ROM. Hematological/Lymphatic/Immunilogical:   No cervical lymphadenopathy. Cardiovascular:   RRR. Symmetric bilateral radial and DP pulses.  No murmurs. Cap refill less than 2 seconds. Respiratory:   Normal respiratory effort without tachypnea/retractions. Breath sounds are clear and equal bilaterally. No wheezes/rales/rhonchi. Gastrointestinal:   Soft and nontender. Non distended. There is no CVA tenderness.  No rebound, rigidity, or guarding. Genitourinary:   deferred Musculoskeletal:   Normal range of motion in all extremities. No joint effusions.  No lower extremity tenderness.  No edema. Neurologic:   Normal speech and language.  Motor grossly intact. No acute focal neurologic deficits are appreciated.  Skin:    Skin is warm, dry and intact. No rash noted.  No petechiae, purpura, or bullae.  ____________________________________________    LABS (pertinent positives/negatives) (all labs ordered are listed, but only abnormal results are displayed) Labs Reviewed  BASIC METABOLIC PANEL - Abnormal; Notable for the following components:      Result Value   Sodium 118 (*)    Chloride 82 (*)    Glucose, Bld 162 (*)    All  other components within normal limits  CBC - Abnormal; Notable for the following components:   WBC 13.5 (*)    All other components within normal limits  URINALYSIS, COMPLETE (UACMP) WITH MICROSCOPIC - Abnormal; Notable for the following components:   Color, Urine STRAW (*)    APPearance HAZY (*)    Leukocytes,Ua TRACE (*)    All other components within normal limits  SARS CORONAVIRUS 2 (HOSPITAL ORDER, [place] [facility])  CBG MONITORING, ED   ____________________________________________   EKG    ____________________________________________  RADIOLOGY  No results found.  ____________________________________________   PROCEDURES Procedures  ____________________________________________    CLINICAL IMPRESSION / ASSESSMENT AND PLAN / ED COURSE  Medications ordered in the ED: Medications  sodium chloride flush (NS) 0.9 % injection 3 mL (has no administration in time range)  sodium chloride 0.9 % bolus 500 mL (500 mLs Intravenous New Bag/Given 06/26/19 1730)    Pertinent labs & imaging results that were available during my care of the patient were reviewed by me and considered in my medical decision making (see chart for details).  Hailey Simmons was evaluated in Emergency Department on 06/26/2019 for the symptoms described in the history of present illness. She was evaluated in the context of the global COVID-19 pandemic, which necessitated consideration that the patient might be at risk for infection with the SARS-CoV-2 virus that causes COVID-19. Institutional protocols and algorithms that pertain to the evaluation of patients at risk for COVID-19 are in a state of rapid change based on information released by regulatory bodies including the CDC and federal and state organizations. These policies and algorithms were followed during the patient's care in the ED.   Patient presents with generalized weakness.  Found to have a sodium of 118.  This is an  acute change from her baseline which is about 135 or more.  We will give an IV saline bolus and admit to the hospital for further management. covid screening ordered      ____________________________________________   FINAL CLINICAL IMPRESSION(S) / ED DIAGNOSES    Final diagnoses:  Generalized weakness  Hyponatremia     ED Discharge Orders    None      Portions of this note were generated with dragon dictation software. Dictation errors may occur despite best attempts at proofreading.   Carrie Mew, MD 06/26/19 [phone]

## 2019-06-26 NOTE — ED Notes (Signed)
Pt assisted to bedside commode. Pt with steady gait. Peri-care performed independently. Pt assisted back to bed. This RN will continue to monitor.

## 2019-06-27 LAB — CBC WITH DIFFERENTIAL/PLATELET
Abs Immature Granulocytes: 0.05 10*3/uL (ref 0.00–0.07)
Basophils Absolute: 0 10*3/uL (ref 0.0–0.1)
Basophils Relative: 0 %
Eosinophils Absolute: 0 10*3/uL (ref 0.0–0.5)
Eosinophils Relative: 0 %
HCT: 32.1 % — ABNORMAL LOW (ref 36.0–46.0)
Hemoglobin: 11.2 g/dL — ABNORMAL LOW (ref 12.0–15.0)
Immature Granulocytes: 0 %
Lymphocytes Relative: 13 %
Lymphs Abs: 1.6 10*3/uL (ref 0.7–4.0)
MCH: 29 pg (ref 26.0–34.0)
MCHC: 34.9 g/dL (ref 30.0–36.0)
MCV: 83.2 fL (ref 80.0–100.0)
Monocytes Absolute: 0.9 10*3/uL (ref 0.1–1.0)
Monocytes Relative: 7 %
Neutro Abs: 9.9 10*3/uL — ABNORMAL HIGH (ref 1.7–7.7)
Neutrophils Relative %: 80 %
Platelets: 195 10*3/uL (ref 150–400)
RBC: 3.86 MIL/uL — ABNORMAL LOW (ref 3.87–5.11)
RDW: 11.7 % (ref 11.5–15.5)
WBC: 12.4 10*3/uL — ABNORMAL HIGH (ref 4.0–10.5)
nRBC: 0 % (ref 0.0–0.2)

## 2019-06-27 LAB — BASIC METABOLIC PANEL
Anion gap: 9 (ref 5–15)
Anion gap: 9 (ref 5–15)
Anion gap: 7 (ref 5–15)
BUN: 9 mg/dL (ref 8–23)
BUN: 7 mg/dL — ABNORMAL LOW (ref 8–23)
BUN: 16 mg/dL (ref 8–23)
CO2: 24 mmol/L (ref 22–32)
CO2: 25 mmol/L (ref 22–32)
CO2: 25 mmol/L (ref 22–32)
Calcium: 8.2 mg/dL — ABNORMAL LOW (ref 8.9–10.3)
Calcium: 8.6 mg/dL — ABNORMAL LOW (ref 8.9–10.3)
Calcium: 8.1 mg/dL — ABNORMAL LOW (ref 8.9–10.3)
Chloride: 87 mmol/L — ABNORMAL LOW (ref 98–111)
Chloride: 89 mmol/L — ABNORMAL LOW (ref 98–111)
Chloride: 89 mmol/L — ABNORMAL LOW (ref 98–111)
Creatinine, Ser: 0.57 mg/dL (ref 0.44–1.00)
Creatinine, Ser: 0.51 mg/dL (ref 0.44–1.00)
Creatinine, Ser: 0.72 mg/dL (ref 0.44–1.00)
GFR calc Af Amer: >60 mL/min (ref >60)
GFR calc Af Amer: >60 mL/min (ref >60)
GFR calc Af Amer: >60 mL/min (ref >60)
GFR calc non Af Amer: >60 mL/min (ref >60)
GFR calc non Af Amer: >60 mL/min (ref >60)
GFR calc non Af Amer: >60 mL/min (ref >60)
Glucose, Bld: 133 mg/dL — ABNORMAL HIGH (ref 70–99)
Glucose, Bld: 98 mg/dL (ref 70–99)
Glucose, Bld: 98 mg/dL (ref 70–99)
Potassium: 3.1 mmol/L — ABNORMAL LOW (ref 3.5–5.1)
Potassium: 3.4 mmol/L — ABNORMAL LOW (ref 3.5–5.1)
Potassium: 3.9 mmol/L (ref 3.5–5.1)
Sodium: 120 mmol/L — ABNORMAL LOW (ref 135–145)
Sodium: 123 mmol/L — ABNORMAL LOW (ref 135–145)
Sodium: 121 mmol/L — ABNORMAL LOW (ref 135–145)

## 2019-06-27 MED ORDER — POTASSIUM CHLORIDE CRYS ER 20 MEQ PO TBCR
20.0000 meq | EXTENDED_RELEASE_TABLET | Freq: Once | ORAL | Status: AC
  Administered 2019-06-27: 20 meq via ORAL
  Filled 2019-06-27: qty 1

## 2019-06-27 NOTE — Consult Note (Signed)
[facility] CONSULT NOTE    Date: 06/27/2019                  Patient Name:  Hailey Simmons  MRN: [id number]  DOB: [birth year]  Age / Sex: 83 y.o., female         PCP: Rusty Aus, MD                 Service Requesting Consult:  Hospitalist                 Reason for Consult:  Hyponatremia            History of Present Illness: Patient is a 83 y.o. female with a PMHx of asthma, basal cell carcinoma, diverticulosis, atrial fibrillation, fibrocystic breast disease, history of goiter, hypertension, hypothyroidism, sinus node dysfunction status post dual-chamber pacemaker placement, history of small bowel obstruction, who was admitted to [facility] on 06/26/2019 for evaluation of weakness.  She reported this finding to her primary care physician.  Serum sodium was checked and was low at 118.  Patient noted to be on furosemide and HCTZ at home.  She also has hypothyroidism and takes levothyroxine 100 mcg daily.  Previously her TSH was 1.672.  She denies nausea, vomiting, or diarrhea.  However she does feel that it is possible that she may become dehydrated over the past several days.  Serum sodium up to 123 after initiation of IV fluid hydration.   Medications: Outpatient medications: Medications Prior to Admission  Medication Sig Dispense Refill Last Dose  . apixaban (ELIQUIS) 5 MG TABS tablet Take 1 tablet (5 mg total) by mouth 2 (two) times daily. Resume 06/26/15 60 tablet 6 06/26/2019 at 0830  . atorvastatin (LIPITOR) 10 MG tablet TAKE ONE (1) TABLET EACH DAY 90 tablet 3 06/25/2019 at 2030  . calcium carbonate (OS-CAL) 600 MG TABS Take 600 mg by mouth 2 (two) times daily with a meal.   06/26/2019 at 0830  . Cholecalciferol (VITAMIN D3) 2000 UNITS TABS Take 1 tablet by mouth daily.   06/26/2019 at 0830  . Cranberry 1000 MG CAPS Take 1 capsule by mouth daily.    06/26/2019 at 0830  . diltiazem (CARDIZEM CD) 240 MG 24 hr capsule TAKE 1 CAPSULE BY MOUTH ONCE DAILY 90 capsule 1  06/25/2019 at 2030  . ESTRACE VAGINAL 0.1 MG/GM vaginal cream Place 1 Applicatorful vaginally as needed.    Past Week at Unknown time  . flecainide (TAMBOCOR) 100 MG tablet TAKE ONE TABLET TWICE DAILY 180 tablet 3 06/26/2019 at 0830  . furosemide (LASIX) 20 MG tablet Take 10 mg by mouth daily.   06/26/2019 at 0830  . hydrochlorothiazide (HYDRODIURIL) 12.5 MG tablet Take 12.5 mg by mouth daily.   06/26/2019 at 0830  . levothyroxine (SYNTHROID, LEVOTHROID) 100 MCG tablet Take 100 mcg by mouth daily.   06/26/2019 at 0730  . polyethylene glycol (MIRALAX / GLYCOLAX) 17 g packet Take 17 g by mouth daily.   06/26/2019 at 0830  . sodium bicarbonate 650 MG tablet Take 650 mg by mouth daily.   Past Week at Unknown time  . lisinopril (PRINIVIL,ZESTRIL) 5 MG tablet Take 1 tablet (5 mg total) by mouth daily. (Patient not taking: Reported on 06/26/2019) 30 tablet 11 Not Taking at Unknown time    Current medications: Current Facility-Administered Medications  Medication Dose Route Frequency Provider Last Rate Last Dose  . 0.9 %  sodium chloride infusion   Intravenous Continuous Dustin Flock, MD  50 mL/hr at 06/26/19 2017    . apixaban (ELIQUIS) tablet 5 mg  5 mg Oral BID Lang Snow, NP   5 mg at 06/27/19 0940  . atorvastatin (LIPITOR) tablet 10 mg  10 mg Oral q1800 Lang Snow, NP   10 mg at 06/26/19 2124  . calcium carbonate (TUMS - dosed in mg elemental calcium) chewable tablet 500 mg  500 mg Oral BID WC Lang Snow, NP   500 mg at 06/27/19 0941  . cholecalciferol (VITAMIN D) tablet 2,000 Units  2,000 Units Oral Daily Lang Snow, NP   2,000 Units at 06/27/19 0940  . diltiazem (CARDIZEM CD) 24 hr capsule 240 mg  240 mg Oral Daily Lang Snow, NP   240 mg at 06/26/19 2124  . estradiol (ESTRACE) vaginal cream 1 Applicatorful  1 Applicatorful Vaginal Daily Ouma, Bing Neighbors, NP      . flecainide (TAMBOCOR) tablet 100 mg  100 mg Oral BID Lang Snow, NP   100 mg at 06/27/19 0940  . levothyroxine (SYNTHROID) tablet 100 mcg  100 mcg Oral Q0600 Lang Snow, NP   100 mcg at 06/27/19 5102  . lisinopril (ZESTRIL) tablet 5 mg  5 mg Oral Daily Lang Snow, NP   5 mg at 06/27/19 0940  . potassium chloride SA (K-DUR) CR tablet 20 mEq  20 mEq Oral Once Dustin Flock, MD      . sodium chloride flush (NS) 0.9 % injection 3 mL  3 mL Intravenous Once Carrie Mew, MD          Allergies: No Known Allergies    Past Medical History: Past Medical History:  Diagnosis Date  . Asthma 1970's - 1990's   a. ? 2/2 wood stove usage.  . Basal cell carcinoma of skin of nose   . Diverticulosis   . Dysrhythmia    A-FIB  . Fibrocystic breast disease   . Goiter   . History of chicken pox   . Hx of colonic polyps   . Hypertension   . Hypothyroidism   . Menopause   . PAF (paroxysmal atrial fibrillation) ([place])    on apixoban  . Sinus node dysfunction ([facility])    a. s/p MDT dual chamber pacemaker  . Small bowel obstruction ([place]) 08/2009     Past Surgical History: Past Surgical History:  Procedure Laterality Date  . COLONOSCOPY    . COLONOSCOPY WITH PROPOFOL N/A 12/02/2018   Procedure: COLONOSCOPY WITH PROPOFOL;  Surgeon: Lollie Sails, MD;  Location: [facility] ENDOSCOPY;  Service: Endoscopy;  Laterality: N/A;  . EP IMPLANTABLE DEVICE N/A 06/22/2015   MDT MRI compatible dual chamber PPM implanted by Dr Caryl Comes for symptomatic bradycardia  . INSERT / REPLACE / REMOVE PACEMAKER       Family History: Family History  Problem Relation Age of Onset  . Heart attack Father   . Heart failure Mother   . Breast cancer Neg Hx      Social History: Social History   Socioeconomic History  . Marital status: Married    Spouse name: Not on file  . Number of children: 3  . Years of education: Not on file  . Highest education level: Not on file  Occupational History  . Occupation: Chartered certified accountant    Comment:  [place]  Social Needs  . Financial resource strain: Not on file  . Food insecurity    Worry: Not on file    Inability: Not  on file  . Transportation needs    Medical: Not on file    Non-medical: Not on file  Tobacco Use  . Smoking status: Never Smoker  . Smokeless tobacco: Never Used  Substance and Sexual Activity  . Alcohol use: No  . Drug use: No  . Sexual activity: Not Currently  Lifestyle  . Physical activity    Days per week: Not on file    Minutes per session: Not on file  . Stress: Not on file  Relationships  . Social Herbalist on phone: Not on file    Gets together: Not on file    Attends religious service: Not on file    Active member of club or organization: Not on file    Attends meetings of clubs or organizations: Not on file    Relationship status: Not on file  . Intimate partner violence    Fear of current or ex partner: Not on file    Emotionally abused: Not on file    Physically abused: Not on file    Forced sexual activity: Not on file  Other Topics Concern  . Not on file  Social History Narrative  . Not on file     Review of Systems: Review of Systems  Constitutional: Positive for malaise/fatigue. Negative for chills and fever.  HENT: Negative for hearing loss and tinnitus.   Eyes: Negative for blurred vision and double vision.  Respiratory: Negative for cough and hemoptysis.   Cardiovascular: Negative for chest pain and palpitations.  Gastrointestinal: Negative for diarrhea, heartburn, nausea and vomiting.  Genitourinary: Negative for dysuria and urgency.  Musculoskeletal: Negative for joint pain and myalgias.  Skin: Negative for itching and rash.  Neurological: Positive for weakness. Negative for dizziness.  Endo/Heme/Allergies: Negative for polydipsia. Does not bruise/bleed easily.  Psychiatric/Behavioral: Negative for depression. The patient is not nervous/anxious.      Vital Signs: Blood pressure (!) 153/55,  pulse 68, temperature 98.2 F (36.8 C), resp. rate 14, height 5\' 3"  (1.6 m), weight 73.8 kg, SpO2 99 %.  Weight trends: Filed Weights   06/26/19 1641 06/27/19 0129  Weight: 74.8 kg 73.8 kg    Physical Exam: General: NAD,   Head: Normocephalic, atraumatic.  Eyes: Anicteric, EOMI  Nose: Mucous membranes moist, not inflammed, nonerythematous.  Throat: Oropharynx nonerythematous, no exudate appreciated.   Neck: Supple, trachea midline.  Lungs:  Normal respiratory effort. Clear to auscultation BL without crackles or wheezes.  Heart: RRR. S1 and S2 normal without gallop, murmur, or rubs.  Abdomen:  BS normoactive. Soft, Nondistended, non-tender.  No masses or organomegaly.  Extremities: No pretibial edema.  Neurologic: A&O X3, Motor strength is 5/5 in the all 4 extremities  Skin: No visible rashes, scars.    Lab results: Basic Metabolic Panel: Recent Labs  Lab 06/26/19 2025 06/27/19 0109 06/27/19 0733  NA 120* 120* 123*  K 3.6 3.1* 3.4*  CL 85* 87* 89*  CO2 24 24 25   GLUCOSE 125* 133* 98  BUN 7* 9 7*  CREATININE 0.46 0.57 0.51  CALCIUM 8.7* 8.2* 8.6*    Liver Function Tests: No results for input(s): AST, ALT, ALKPHOS, BILITOT, PROT, ALBUMIN in the last 168 hours. No results for input(s): LIPASE, AMYLASE in the last 168 hours. No results for input(s): AMMONIA in the last 168 hours.  CBC: Recent Labs  Lab 06/26/19 1648 06/27/19 0109  WBC 13.5* 12.4*  NEUTROABS  --  9.9*  HGB 12.9 11.2*  HCT 37.2 32.1*  MCV 83.8 83.2  PLT 220 195    Cardiac Enzymes: No results for input(s): CKTOTAL, CKMB, CKMBINDEX, TROPONINI in the last 168 hours.  BNP: Invalid input(s): POCBNP  CBG: No results for input(s): GLUCAP in the last 168 hours.  Microbiology: Results for orders placed or performed during the hospital encounter of 06/26/19  SARS Coronavirus 2 (CEPHEID - Performed in [place] hospital lab), Hosp Order     Status: None   Collection Time: 06/26/19  5:31 PM    Specimen: Nasopharyngeal Swab  Result Value Ref Range Status   SARS Coronavirus 2 NEGATIVE NEGATIVE Final    Comment: (NOTE) If result is NEGATIVE SARS-CoV-2 target nucleic acids are NOT DETECTED. The SARS-CoV-2 RNA is generally detectable in upper and lower  respiratory specimens during the acute phase of infection. The lowest  concentration of SARS-CoV-2 viral copies this assay can detect is 250  copies / mL. A negative result does not preclude SARS-CoV-2 infection  and should not be used as the sole basis for treatment or other  patient management decisions.  A negative result may occur with  improper specimen collection / handling, submission of specimen other  than nasopharyngeal swab, presence of viral mutation(s) within the  areas targeted by this assay, and inadequate number of viral copies  (<250 copies / mL). A negative result must be combined with clinical  observations, patient history, and epidemiological information. If result is POSITIVE SARS-CoV-2 target nucleic acids are DETECTED. The SARS-CoV-2 RNA is generally detectable in upper and lower  respiratory specimens dur ing the acute phase of infection.  Positive  results are indicative of active infection with SARS-CoV-2.  Clinical  correlation with patient history and other diagnostic information is  necessary to determine patient infection status.  Positive results do  not rule out bacterial infection or co-infection with other viruses. If result is PRESUMPTIVE POSTIVE SARS-CoV-2 nucleic acids MAY BE PRESENT.   A presumptive positive result was obtained on the submitted specimen  and confirmed on repeat testing.  While 2019 novel coronavirus  (SARS-CoV-2) nucleic acids may be present in the submitted sample  additional confirmatory testing may be necessary for epidemiological  and / or clinical management purposes  to differentiate between  SARS-CoV-2 and other Sarbecovirus currently known to infect humans.  If  clinically indicated additional testing with an alternate test  methodology [phone]) is advised. The SARS-CoV-2 RNA is generally  detectable in upper and lower respiratory sp ecimens during the acute  phase of infection. The expected result is Negative. Fact Sheet for Patients:  StrictlyIdeas.no Fact Sheet for Healthcare Providers: BankingDealers.co.za This test is not yet approved or cleared by the [place] FDA and has been authorized for detection and/or diagnosis of SARS-CoV-2 by FDA under an Emergency Use Authorization (EUA).  This EUA will remain in effect (meaning this test can be used) for the duration of the COVID-19 declaration under Section 564(b)(1) of the Act, 21 U.S.C. section 360bbb-3(b)(1), unless the authorization is terminated or revoked sooner. Performed at [facility], [address] [place], [place] [address]     Coagulation Studies: No results for input(s): LABPROT, INR in the last 72 hours.  Urinalysis: Recent Labs    06/26/19 1648  COLORURINE STRAW*  LABSPEC 1.009  PHURINE 5.0  GLUCOSEU NEGATIVE  HGBUR NEGATIVE  BILIRUBINUR NEGATIVE  KETONESUR NEGATIVE  PROTEINUR NEGATIVE  NITRITE NEGATIVE  LEUKOCYTESUR TRACE*      Imaging:  No results found.   Assessment & Plan: Pt is  a 83 y.o. female with a PMHx of asthma, basal cell carcinoma, diverticulosis, atrial fibrillation, fibrocystic breast disease, history of goiter, hypertension, hypothyroidism, sinus node dysfunction status post dual-chamber pacemaker placement, history of small bowel obstruction, who was admitted to [facility] on 06/26/2019 for evaluation of weakness.  1.  Hyponatremia, felt to be hypovolemic as patient was on HCTZ at home. 2.  Hypertension.  Plan: Serum sodium was 118 upon admission.  Patient noted to be on HCTZ at home.  She denies nausea, vomiting, or diarrhea.  However she believes that it is possible that she became  dehydrated over the past several days.  After discontinuation of HCTZ sodium is come up to 123.  Continue 0.9 normal saline at 75 cc/h.  Continue to monitor serum sodium at least daily.  Target sodium within 24 hours of admission is 126.  Further plan as patient progresses.  Thanks for consultation.

## 2019-06-27 NOTE — Progress Notes (Signed)
Advanced care plan.  Purpose of the Encounter: CODE STATUS  Parties in Attendance: Patient herself  Patient's Decision Capacity: Intact  Subjective/Patient's story: 83 year old femalewith a known history of HTN, hypothyroidism, Afib (Eliquis), SSS (s/p MedTronic PPM, diverticulosis, p/w generalized weakness.  Patient had a similar presentation in the past in January's where she presented with generalized weakness and noticed to have severe hyponatremia.   Objective/Medical story I discussed with the patient regarding her desires for cardiac and pulmonary resuscitation.  Also discussed with her regarding a living will and healthcare power of attorney.   Goals of care determination:   Patient states she would like to have everything be done  CODE STATUS: Full code   Time spent discussing advanced care planning: 16 minutes

## 2019-06-27 NOTE — Progress Notes (Signed)
Pt resting quietly with NAD noted.  Family at bedside requesting that all direct care be deferred until her mother awakens from her rest.

## 2019-06-27 NOTE — Plan of Care (Signed)

## 2019-06-27 NOTE — Progress Notes (Signed)
[place] [facility]                                                                                                                                                                                  Patient Demographics   Hailey Simmons, is a 83 y.o. female, DOB - 03-12-1936, EHU:[id number]  Admit date - 06/26/2019   Admitting Physician Lang Snow, NP  Outpatient Primary MD for the patient is Rusty Aus, MD   LOS - 1  Subjective: Patient states that she is feeling little better still very tired denies any pain    Review of Systems:   CONSTITUTIONAL: No documented fever.  Positive fatigue, positive weakness. No weight gain, no weight loss.  EYES: No blurry or double vision.  ENT: No tinnitus. No postnasal drip. No redness of the oropharynx.  RESPIRATORY: No cough, no wheeze, no hemoptysis. No dyspnea.  CARDIOVASCULAR: No chest pain. No orthopnea. No palpitations. No syncope.  GASTROINTESTINAL: No nausea, no vomiting or diarrhea. No abdominal pain. No melena or hematochezia.  GENITOURINARY: No dysuria or hematuria.  ENDOCRINE: No polyuria or nocturia. No heat or cold intolerance.  HEMATOLOGY: No anemia. No bruising. No bleeding.  INTEGUMENTARY: No rashes. No lesions.  MUSCULOSKELETAL: No arthritis. No swelling. No gout.  NEUROLOGIC: No numbness, tingling, or ataxia. No seizure-type activity.  PSYCHIATRIC: No anxiety. No insomnia. No ADD.    Vitals:   Vitals:   06/26/19 2020 06/27/19 0129 06/27/19 0319 06/27/19 0752  BP: (!) 159/66  (!) 137/56 (!) 153/55  Pulse: 68  67 68  Resp: 20  14   Temp: 97.9 F (36.6 C)  98.4 F (36.9 C) 98.2 F (36.8 C)  TempSrc: Oral  Oral   SpO2: 100%  98% 99%  Weight:  73.8 kg    Height:        Wt Readings from Last 3 Encounters:  06/27/19 73.8 kg  12/06/18 74.4 kg  12/02/18 73.5 kg     Intake/Output Summary (Last 24 hours) at 06/27/2019 1405 Last data filed at 06/27/2019 0900 Gross per 24  hour  Intake 900 ml  Output 1200 ml  Net -300 ml    Physical Exam:   GENERAL: Pleasant-appearing in no apparent distress.  HEAD, EYES, EARS, NOSE AND THROAT: Atraumatic, normocephalic. Extraocular muscles are intact. Pupils equal and reactive to light. Sclerae anicteric. No conjunctival injection. No oro-pharyngeal erythema.  NECK: Supple. There is no jugular venous distention. No bruits, no lymphadenopathy, no thyromegaly.  HEART: Regular rate and rhythm,. No murmurs, no rubs, no clicks.  LUNGS: Clear to auscultation bilaterally. No rales or rhonchi. No wheezes.  ABDOMEN: Soft, flat, nontender, nondistended.  Has good bowel sounds. No hepatosplenomegaly appreciated.  EXTREMITIES: No evidence of any cyanosis, clubbing, or peripheral edema.  +2 pedal and radial pulses bilaterally.  NEUROLOGIC: The patient is alert, awake, and oriented x3 with no focal motor or sensory deficits appreciated bilaterally.  SKIN: Moist and warm with no rashes appreciated.  Psych: Not anxious, depressed LN: No inguinal LN enlargement    Antibiotics   Anti-infectives (From admission, onward)   None      Medications   Scheduled Meds: . apixaban  5 mg Oral BID  . atorvastatin  10 mg Oral q1800  . calcium carbonate  500 mg Oral BID WC  . cholecalciferol  2,000 Units Oral Daily  . diltiazem  240 mg Oral Daily  . estradiol  1 Applicatorful Vaginal Daily  . flecainide  100 mg Oral BID  . levothyroxine  100 mcg Oral Q0600  . lisinopril  5 mg Oral Daily  . sodium chloride flush  3 mL Intravenous Once   Continuous Infusions: . sodium chloride 75 mL/hr at 06/27/19 1252   PRN Meds:.   Data Review:   Micro Results Recent Results (from the past 240 hour(s))  SARS Coronavirus 2 (CEPHEID - Performed in [place] hospital lab), Hosp Order     Status: None   Collection Time: 06/26/19  5:31 PM   Specimen: Nasopharyngeal Swab  Result Value Ref Range Status   SARS Coronavirus 2 NEGATIVE NEGATIVE Final     Comment: (NOTE) If result is NEGATIVE SARS-CoV-2 target nucleic acids are NOT DETECTED. The SARS-CoV-2 RNA is generally detectable in upper and lower  respiratory specimens during the acute phase of infection. The lowest  concentration of SARS-CoV-2 viral copies this assay can detect is 250  copies / mL. A negative result does not preclude SARS-CoV-2 infection  and should not be used as the sole basis for treatment or other  patient management decisions.  A negative result may occur with  improper specimen collection / handling, submission of specimen other  than nasopharyngeal swab, presence of viral mutation(s) within the  areas targeted by this assay, and inadequate number of viral copies  (<250 copies / mL). A negative result must be combined with clinical  observations, patient history, and epidemiological information. If result is POSITIVE SARS-CoV-2 target nucleic acids are DETECTED. The SARS-CoV-2 RNA is generally detectable in upper and lower  respiratory specimens dur ing the acute phase of infection.  Positive  results are indicative of active infection with SARS-CoV-2.  Clinical  correlation with patient history and other diagnostic information is  necessary to determine patient infection status.  Positive results do  not rule out bacterial infection or co-infection with other viruses. If result is PRESUMPTIVE POSTIVE SARS-CoV-2 nucleic acids MAY BE PRESENT.   A presumptive positive result was obtained on the submitted specimen  and confirmed on repeat testing.  While 2019 novel coronavirus  (SARS-CoV-2) nucleic acids may be present in the submitted sample  additional confirmatory testing may be necessary for epidemiological  and / or clinical management purposes  to differentiate between  SARS-CoV-2 and other Sarbecovirus currently known to infect humans.  If clinically indicated additional testing with an alternate test  methodology [phone]) is advised. The SARS-CoV-2  RNA is generally  detectable in upper and lower respiratory sp ecimens during the acute  phase of infection. The expected result is Negative. Fact Sheet for Patients:  StrictlyIdeas.no Fact Sheet for Healthcare Providers: BankingDealers.co.za This test is not yet approved or cleared by the  [place] Architectural technologist and has been authorized for detection and/or diagnosis of SARS-CoV-2 by FDA under an Print production planner (EUA).  This EUA will remain in effect (meaning this test can be used) for the duration of the COVID-19 declaration under Section 564(b)(1) of the Act, 21 U.S.C. section 360bbb-3(b)(1), unless the authorization is terminated or revoked sooner. Performed at [facility], [address] [place], [place] [address]     Radiology Reports No results found.   CBC Recent Labs  Lab 06/26/19 1648 06/27/19 0109  WBC 13.5* 12.4*  HGB 12.9 11.2*  HCT 37.2 32.1*  PLT 220 195  MCV 83.8 83.2  MCH 29.1 29.0  MCHC 34.7 34.9  RDW 11.7 11.7  LYMPHSABS  --  1.6  MONOABS  --  0.9  EOSABS  --  0.0  BASOSABS  --  0.0    Chemistries  Recent Labs  Lab 06/26/19 1648 06/26/19 2025 06/27/19 0109 06/27/19 0733  NA 118* 120* 120* 123*  K 3.7 3.6 3.1* 3.4*  CL 82* 85* 87* 89*  CO2 23 24 24 25   GLUCOSE 162* 125* 133* 98  BUN 8 7* 9 7*  CREATININE 0.61 0.46 0.57 0.51  CALCIUM 9.1 8.7* 8.2* 8.6*   ------------------------------------------------------------------------------------------------------------------ estimated creatinine clearance is 54 mL/min (by C-G formula based on SCr of 0.51 mg/dL). ------------------------------------------------------------------------------------------------------------------ No results for input(s): HGBA1C in the last 72 hours. ------------------------------------------------------------------------------------------------------------------ No results for input(s): CHOL, HDL,  LDLCALC, TRIG, CHOLHDL, LDLDIRECT in the last 72 hours. ------------------------------------------------------------------------------------------------------------------ No results for input(s): TSH, T4TOTAL, T3FREE, THYROIDAB in the last 72 hours.  Invalid input(s): FREET3 ------------------------------------------------------------------------------------------------------------------ No results for input(s): VITAMINB12, FOLATE, FERRITIN, TIBC, IRON, RETICCTPCT in the last 72 hours.  Coagulation profile No results for input(s): INR, PROTIME in the last 168 hours.  No results for input(s): DDIMER in the last 72 hours.  Cardiac Enzymes No results for input(s): CKMB, TROPONINI, MYOGLOBIN in the last 168 hours.  Invalid input(s): CK ------------------------------------------------------------------------------------------------------------------ Invalid input(s): POCBNP    Assessment & Plan   83 y.o. female with past medical history of paroxysmal atrial fibrillation, hypertension, hypothyroidism, history of small bowel obstruction, fibrocystic breast disease who presented to the hospital due to generalized weakness and lethargy  1.   Hyponatremia due to HCTZ therapy and dehydration we will giving IV fluids monitor sodium level, patient had similar presentation in January.  She is on both Lasix and HCTZ I would avoid HCTZ for rest of her life.  She can wear compression stockings.  2.   Generalized weakness PT evaluation   3. Paroxysmal atrial fibrillation -rate controlled - Continue Cardizem+ flecainide - Continue Eliquis for anticoagulation  4. Hypothyroidism - Continue Synthroid  5. HLD  Continue - Atorvastatin 10mg  PO qhs  6. HTN  - Continue lisinopril and Cardizem as above -HCTZ   discontinued     Code Status Orders  (From admission, onward)         Start     Ordered   06/26/19 1844  Full code  Continuous     06/26/19 1844        Code Status History     Date Active Date Inactive Code Status Order ID Comments User Context   12/07/2018 0230 12/08/2018 2145 Full Code [id number]  Arta Silence, MD Inpatient   06/22/2015 1224 06/23/2015 1312 Full Code [id number]  Deboraha Sprang, MD Inpatient   Advance Care Planning Activity           Consults nephrology   DVT Prophylaxis  Lovenox   Lab Results  Component Value Date   PLT 195 06/27/2019     Time Spent in minutes 35 minutes greater than 50% of time spent in care coordination and counseling patient regarding the condition and plan of care.   Dustin Flock M.D on 06/27/2019 at 2:05 PM  Between 7am to 6pm - Pager - [phone]  After 6pm go to [email or web address] - Proofreader  [employer]   Office  [phone]

## 2019-06-28 LAB — BASIC METABOLIC PANEL WITH GFR
Anion gap: 6 (ref 5–15)
BUN: 15 mg/dL (ref 8–23)
CO2: 25 mmol/L (ref 22–32)
Calcium: 8.2 mg/dL — ABNORMAL LOW (ref 8.9–10.3)
Chloride: 90 mmol/L — ABNORMAL LOW (ref 98–111)
Creatinine, Ser: 0.71 mg/dL (ref 0.44–1.00)
GFR calc Af Amer: >60 mL/min (ref >60)
GFR calc non Af Amer: >60 mL/min (ref >60)
Glucose, Bld: 96 mg/dL (ref 70–99)
Potassium: 4.1 mmol/L (ref 3.5–5.1)
Sodium: 121 mmol/L — ABNORMAL LOW (ref 135–145)

## 2019-06-28 LAB — BASIC METABOLIC PANEL
Anion gap: 7 (ref 5–15)
Anion gap: 8 (ref 5–15)
BUN: 9 mg/dL (ref 8–23)
BUN: 13 mg/dL (ref 8–23)
CO2: 22 mmol/L (ref 22–32)
CO2: 23 mmol/L (ref 22–32)
Calcium: 8.6 mg/dL — ABNORMAL LOW (ref 8.9–10.3)
Calcium: 8.5 mg/dL — ABNORMAL LOW (ref 8.9–10.3)
Chloride: 97 mmol/L — ABNORMAL LOW (ref 98–111)
Chloride: 94 mmol/L — ABNORMAL LOW (ref 98–111)
Creatinine, Ser: 0.53 mg/dL (ref 0.44–1.00)
Creatinine, Ser: 0.72 mg/dL (ref 0.44–1.00)
GFR calc Af Amer: >60 mL/min (ref >60)
GFR calc Af Amer: >60 mL/min (ref >60)
GFR calc non Af Amer: >60 mL/min (ref >60)
GFR calc non Af Amer: >60 mL/min (ref >60)
Glucose, Bld: 90 mg/dL (ref 70–99)
Glucose, Bld: 113 mg/dL — ABNORMAL HIGH (ref 70–99)
Potassium: 4.1 mmol/L (ref 3.5–5.1)
Potassium: 4 mmol/L (ref 3.5–5.1)
Sodium: 126 mmol/L — ABNORMAL LOW (ref 135–145)
Sodium: 125 mmol/L — ABNORMAL LOW (ref 135–145)

## 2019-06-28 NOTE — Consult Note (Signed)
[facility] CONSULT NOTE    Date: 06/28/2019                  Patient Name:  Hailey Simmons  MRN: [id number]  DOB: December 10, 1935  Age / Sex: 83 y.o., female         PCP: Rusty Aus, MD                 Service Requesting Consult:  Hospitalist                 Reason for Consult:  Hyponatremia            History of Present Illness: Patient is a 83 y.o. female with a PMHx of asthma, basal cell carcinoma, diverticulosis, atrial fibrillation, fibrocystic breast disease, history of goiter, hypertension, hypothyroidism, sinus node dysfunction status post dual-chamber pacemaker placement, history of small bowel obstruction, who was admitted to [facility] on 06/26/2019 for evaluation of weakness.  She reported this finding to her primary care physician.  Serum sodium was checked and was low at 118.  Patient noted to be on furosemide and HCTZ at home.  She also has hypothyroidism and takes levothyroxine 100 mcg daily.  Previously her TSH was 1.672.  She denies nausea, vomiting, or diarrhea.  However she does feel that it is possible that she may become dehydrated over the past several days.  Serum sodium up to 123 after initiation of IV fluid hydration.   Medications: Outpatient medications: Medications Prior to Admission  Medication Sig Dispense Refill Last Dose  . apixaban (ELIQUIS) 5 MG TABS tablet Take 1 tablet (5 mg total) by mouth 2 (two) times daily. Resume 06/26/15 60 tablet 6 06/26/2019 at 0830  . atorvastatin (LIPITOR) 10 MG tablet TAKE ONE (1) TABLET EACH DAY 90 tablet 3 06/25/2019 at 2030  . calcium carbonate (OS-CAL) 600 MG TABS Take 600 mg by mouth 2 (two) times daily with a meal.   06/26/2019 at 0830  . Cholecalciferol (VITAMIN D3) 2000 UNITS TABS Take 1 tablet by mouth daily.   06/26/2019 at 0830  . Cranberry 1000 MG CAPS Take 1 capsule by mouth daily.    06/26/2019 at 0830  . diltiazem (CARDIZEM CD) 240 MG 24 hr capsule TAKE 1 CAPSULE BY MOUTH ONCE DAILY 90 capsule 1  06/25/2019 at 2030  . ESTRACE VAGINAL 0.1 MG/GM vaginal cream Place 1 Applicatorful vaginally as needed.    Past Week at Unknown time  . flecainide (TAMBOCOR) 100 MG tablet TAKE ONE TABLET TWICE DAILY 180 tablet 3 06/26/2019 at 0830  . furosemide (LASIX) 20 MG tablet Take 10 mg by mouth daily.   06/26/2019 at 0830  . hydrochlorothiazide (HYDRODIURIL) 12.5 MG tablet Take 12.5 mg by mouth daily.   06/26/2019 at 0830  . levothyroxine (SYNTHROID, LEVOTHROID) 100 MCG tablet Take 100 mcg by mouth daily.   06/26/2019 at 0730  . polyethylene glycol (MIRALAX / GLYCOLAX) 17 g packet Take 17 g by mouth daily.   06/26/2019 at 0830  . sodium bicarbonate 650 MG tablet Take 650 mg by mouth daily.   Past Week at Unknown time  . lisinopril (PRINIVIL,ZESTRIL) 5 MG tablet Take 1 tablet (5 mg total) by mouth daily. (Patient not taking: Reported on 06/26/2019) 30 tablet 11 Not Taking at Unknown time    Current medications: Current Facility-Administered Medications  Medication Dose Route Frequency Provider Last Rate Last Dose  . 0.9 %  sodium chloride infusion   Intravenous Continuous Dustin Flock, MD  75 mL/hr at 06/28/19 0417    . apixaban (ELIQUIS) tablet 5 mg  5 mg Oral BID Lang Snow, NP   5 mg at 06/28/19 0837  . atorvastatin (LIPITOR) tablet 10 mg  10 mg Oral q1800 Lang Snow, NP   10 mg at 06/27/19 1816  . calcium carbonate (TUMS - dosed in mg elemental calcium) chewable tablet 500 mg  500 mg Oral BID WC Lang Snow, NP   500 mg at 06/28/19 0835  . cholecalciferol (VITAMIN D) tablet 2,000 Units  2,000 Units Oral Daily Lang Snow, NP   2,000 Units at 06/28/19 [phone]  . diltiazem (CARDIZEM CD) 24 hr capsule 240 mg  240 mg Oral Daily Lang Snow, NP   240 mg at 06/27/19 2119  . estradiol (ESTRACE) vaginal cream 1 Applicatorful  1 Applicatorful Vaginal Daily Ouma, Bing Neighbors, NP      . flecainide [facility]) tablet 100 mg  100 mg Oral BID Lang Snow, NP   100 mg at 06/28/19 0837  . levothyroxine (SYNTHROID) tablet 100 mcg  100 mcg Oral Q0600 Lang Snow, NP   100 mcg at 06/28/19 0544  . lisinopril (ZESTRIL) tablet 5 mg  5 mg Oral Daily Lang Snow, NP   5 mg at 06/28/19 0837  . sodium chloride flush (NS) 0.9 % injection 3 mL  3 mL Intravenous Once Carrie Mew, MD          Allergies: No Known Allergies    Past Medical History: Past Medical History:  Diagnosis Date  . Asthma 1970's - 1990's   a. ? 2/2 wood stove usage.  . Basal cell carcinoma of skin of nose   . Diverticulosis   . Dysrhythmia    A-FIB  . Fibrocystic breast disease   . Goiter   . History of chicken pox   . Hx of colonic polyps   . Hypertension   . Hypothyroidism   . Menopause   . PAF (paroxysmal atrial fibrillation) ([place])    on apixoban  . Sinus node dysfunction ([facility])    a. s/p MDT dual chamber pacemaker  . Small bowel obstruction ([place]) 08/2009     Past Surgical History: Past Surgical History:  Procedure Laterality Date  . COLONOSCOPY    . COLONOSCOPY WITH PROPOFOL N/A 12/02/2018   Procedure: COLONOSCOPY WITH PROPOFOL;  Surgeon: Lollie Sails, MD;  Location: [facility]COPY;  Service: Endoscopy;  Laterality: N/A;  . EP IMPLANTABLE DEVICE N/A 06/22/2015   MDT MRI compatible dual chamber PPM implanted by Dr Caryl Comes for symptomatic bradycardia  . INSERT / REPLACE / REMOVE PACEMAKER       Family History: Family History  Problem Relation Age of Onset  . Heart attack Father   . Heart failure Mother   . Breast cancer Neg Hx      Social History: Social History   Socioeconomic History  . Marital status: Married    Spouse name: Not on file  . Number of children: 3  . Years of education: Not on file  . Highest education level: Not on file  Occupational History  . Occupation: Chartered certified accountant    Comment: Derma  Social Needs  . Financial resource strain: Not on file  . Food  insecurity    Worry: Not on file    Inability: Not on file  . Transportation needs    Medical: Not on file    Non-medical: Not on file  Tobacco  Use  . Smoking status: Never Smoker  . Smokeless tobacco: Never Used  Substance and Sexual Activity  . Alcohol use: No  . Drug use: No  . Sexual activity: Not Currently  Lifestyle  . Physical activity    Days per week: Not on file    Minutes per session: Not on file  . Stress: Not on file  Relationships  . Social Herbalist on phone: Not on file    Gets together: Not on file    Attends religious service: Not on file    Active member of club or organization: Not on file    Attends meetings of clubs or organizations: Not on file    Relationship status: Not on file  . Intimate partner violence    Fear of current or ex partner: Not on file    Emotionally abused: Not on file    Physically abused: Not on file    Forced sexual activity: Not on file  Other Topics Concern  . Not on file  Social History Narrative  . Not on file     Review of Systems: Review of Systems  Constitutional: Positive for malaise/fatigue. Negative for chills and fever.  HENT: Negative for hearing loss and tinnitus.   Eyes: Negative for blurred vision and double vision.  Respiratory: Negative for cough and hemoptysis.   Cardiovascular: Negative for chest pain and palpitations.  Gastrointestinal: Negative for diarrhea, heartburn, nausea and vomiting.  Genitourinary: Negative for dysuria and urgency.  Musculoskeletal: Negative for joint pain and myalgias.  Skin: Negative for itching and rash.  Neurological: Positive for weakness. Negative for dizziness.  Endo/Heme/Allergies: Negative for polydipsia. Does not bruise/bleed easily.  Psychiatric/Behavioral: Negative for depression. The patient is not nervous/anxious.      Vital Signs: Blood pressure (!) 131/52, pulse 67, temperature 98 F (36.7 C), temperature source Oral, resp. rate 18, height 5\' 3"   (1.6 m), weight 75.8 kg, SpO2 100 %.  Weight trends: Filed Weights   06/26/19 1641 06/27/19 0129 06/28/19 0356  Weight: 74.8 kg 73.8 kg 75.8 kg    Physical Exam: General: NAD,   Head: Normocephalic, atraumatic.  Eyes: Anicteric, EOMI  Nose: Mucous membranes moist, not inflammed, nonerythematous.  Throat: Oropharynx nonerythematous, no exudate appreciated.   Neck: Supple, trachea midline.  Lungs:  Normal respiratory effort. Clear to auscultation BL without crackles or wheezes.  Heart: RRR. S1 and S2 normal without gallop, murmur, or rubs.  Abdomen:  BS normoactive. Soft, Nondistended, non-tender.  No masses or organomegaly.  Extremities: No pretibial edema.  Neurologic: A&O X3, Motor strength is 5/5 in the all 4 extremities  Skin: No visible rashes, scars.    Lab results: Basic Metabolic Panel: Recent Labs  Lab 06/27/19 1548 06/27/19 2350 06/28/19 0753  NA 121* 121* 126*  K 3.9 4.1 4.1  CL 89* 90* 97*  CO2 25 25 22   GLUCOSE 98 96 90  BUN 16 15 9   CREATININE 0.72 0.71 0.53  CALCIUM 8.1* 8.2* 8.6*    Liver Function Tests: No results for input(s): AST, ALT, ALKPHOS, BILITOT, PROT, ALBUMIN in the last 168 hours. No results for input(s): LIPASE, AMYLASE in the last 168 hours. No results for input(s): AMMONIA in the last 168 hours.  CBC: Recent Labs  Lab 06/26/19 1648 06/27/19 0109  WBC 13.5* 12.4*  NEUTROABS  --  9.9*  HGB 12.9 11.2*  HCT 37.2 32.1*  MCV 83.8 83.2  PLT 220 195    Cardiac Enzymes: No results  for input(s): CKTOTAL, CKMB, CKMBINDEX, TROPONINI in the last 168 hours.  BNP: Invalid input(s): POCBNP  CBG: No results for input(s): GLUCAP in the last 168 hours.  Microbiology: Results for orders placed or performed during the hospital encounter of 06/26/19  SARS Coronavirus 2 (CEPHEID - Performed in [place] hospital lab), Hosp Order     Status: None   Collection Time: 06/26/19  5:31 PM   Specimen: Nasopharyngeal Swab  Result Value Ref Range  Status   SARS Coronavirus 2 NEGATIVE NEGATIVE Final    Comment: (NOTE) If result is NEGATIVE SARS-CoV-2 target nucleic acids are NOT DETECTED. The SARS-CoV-2 RNA is generally detectable in upper and lower  respiratory specimens during the acute phase of infection. The lowest  concentration of SARS-CoV-2 viral copies this assay can detect is 250  copies / mL. A negative result does not preclude SARS-CoV-2 infection  and should not be used as the sole basis for treatment or other  patient management decisions.  A negative result may occur with  improper specimen collection / handling, submission of specimen other  than nasopharyngeal swab, presence of viral mutation(s) within the  areas targeted by this assay, and inadequate number of viral copies  (<250 copies / mL). A negative result must be combined with clinical  observations, patient history, and epidemiological information. If result is POSITIVE SARS-CoV-2 target nucleic acids are DETECTED. The SARS-CoV-2 RNA is generally detectable in upper and lower  respiratory specimens dur ing the acute phase of infection.  Positive  results are indicative of active infection with SARS-CoV-2.  Clinical  correlation with patient history and other diagnostic information is  necessary to determine patient infection status.  Positive results do  not rule out bacterial infection or co-infection with other viruses. If result is PRESUMPTIVE POSTIVE SARS-CoV-2 nucleic acids MAY BE PRESENT.   A presumptive positive result was obtained on the submitted specimen  and confirmed on repeat testing.  While 2019 novel coronavirus  (SARS-CoV-2) nucleic acids may be present in the submitted sample  additional confirmatory testing may be necessary for epidemiological  and / or clinical management purposes  to differentiate between  SARS-CoV-2 and other Sarbecovirus currently known to infect humans.  If clinically indicated additional testing with an alternate  test  methodology [phone]) is advised. The SARS-CoV-2 RNA is generally  detectable in upper and lower respiratory sp ecimens during the acute  phase of infection. The expected result is Negative. Fact Sheet for Patients:  StrictlyIdeas.no Fact Sheet for Healthcare Providers: BankingDealers.co.za This test is not yet approved or cleared by the [place] FDA and has been authorized for detection and/or diagnosis of SARS-CoV-2 by FDA under an Emergency Use Authorization (EUA).  This EUA will remain in effect (meaning this test can be used) for the duration of the COVID-19 declaration under Section 564(b)(1) of the Act, 21 U.S.C. section 360bbb-3(b)(1), unless the authorization is terminated or revoked sooner. Performed at [facility], [address] [place], [place] [address]     Coagulation Studies: No results for input(s): LABPROT, INR in the last 72 hours.  Urinalysis: Recent Labs    06/26/19 1648  COLORURINE STRAW*  LABSPEC 1.009  PHURINE 5.0  GLUCOSEU NEGATIVE  HGBUR NEGATIVE  BILIRUBINUR NEGATIVE  KETONESUR NEGATIVE  PROTEINUR NEGATIVE  NITRITE NEGATIVE  LEUKOCYTESUR TRACE*      Imaging: No results found.   Assessment & Plan: Pt is a 83 y.o. female with a PMHx of asthma, basal cell carcinoma, diverticulosis, atrial fibrillation,  fibrocystic breast disease, history of goiter, hypertension, hypothyroidism, sinus node dysfunction status post dual-chamber pacemaker placement, history of small bowel obstruction, who was admitted to [facility] on 06/26/2019 for evaluation of weakness.  1.  Hyponatremia, felt to be hypovolemic as patient was on HCTZ at home. 2.  Hypertension.  Plan: Serum sodium was 118 upon admission.  Patient noted to be on HCTZ at home.  She denies nausea, vomiting, or diarrhea.  However she believes that it is possible that she became dehydrated over the past several days.  After  discontinuation of HCTZ sodium is come up to 123.  Continue 0.9 normal saline at 75 cc/h.  Continue to monitor serum sodium at least daily.  Target sodium within 24 hours of admission is 126.  Further plan as patient progresses.  Thanks for consultation.

## 2019-06-28 NOTE — Plan of Care (Signed)
  Problem: Education: Goal: Knowledge of General Education information will improve Description: Including pain rating scale, medication(s)/side effects and non-pharmacologic comfort measures Outcome: Progressing   Problem: Activity: Goal: Risk for activity intolerance will decrease Outcome: Progressing   

## 2019-06-28 NOTE — Progress Notes (Signed)
[facility]  ROUNDING NOTE   Subjective:  Serum sodium improving. Currently up to 126. Overall feeling better as compared to admission.   Objective:  Vital signs in last 24 hours:  Temp:  [97.7 F (36.5 C)-98.7 F (37.1 C)] 98 F (36.7 C) (07/26 0729) Pulse Rate:  [59-67] 67 (07/26 0729) Resp:  [12-18] 18 (07/26 0729) BP: (124-139)/(51-56) 131/52 (07/26 0729) SpO2:  [98 %-100 %] 100 % (07/26 0729) Weight:  [75.8 kg] 75.8 kg (07/26 0356)  Weight change: 0.998 kg Filed Weights   06/26/19 1641 06/27/19 0129 06/28/19 0356  Weight: 74.8 kg 73.8 kg 75.8 kg    Intake/Output: I/O last 3 completed shifts: In: 2664.8 [P.O.:880; I.V.:1784.8] Out: 2800 [Urine:2800]   Intake/Output this shift:  Total I/O In: 240 [P.O.:240] Out: 800 [Urine:800]  Physical Exam: General: No acute distress  Head: Normocephalic, atraumatic. Moist oral mucosal membranes  Eyes: Anicteric  Neck: Supple, trachea midline  Lungs:  Clear to auscultation, normal effort  Heart: S1S2 no rubs  Abdomen:  Soft, nontender, bowel sounds present  Extremities: No peripheral edema.  Neurologic: Awake, alert, following commands  Skin: No lesions       Basic Metabolic Panel: Recent Labs  Lab 06/27/19 0109 06/27/19 0733 06/27/19 1548 06/27/19 2350 06/28/19 0753  NA 120* 123* 121* 121* 126*  K 3.1* 3.4* 3.9 4.1 4.1  CL 87* 89* 89* 90* 97*  CO2 24 25 25 25 22   GLUCOSE 133* 98 98 96 90  BUN 9 7* 16 15 9   CREATININE 0.57 0.51 0.72 0.71 0.53  CALCIUM 8.2* 8.6* 8.1* 8.2* 8.6*    Liver Function Tests: No results for input(s): AST, ALT, ALKPHOS, BILITOT, PROT, ALBUMIN in the last 168 hours. No results for input(s): LIPASE, AMYLASE in the last 168 hours. No results for input(s): AMMONIA in the last 168 hours.  CBC: Recent Labs  Lab 06/26/19 1648 06/27/19 0109  WBC 13.5* 12.4*  NEUTROABS  --  9.9*  HGB 12.9 11.2*  HCT 37.2 32.1*  MCV 83.8 83.2  PLT 220 195    Cardiac Enzymes: No  results for input(s): CKTOTAL, CKMB, CKMBINDEX, TROPONINI in the last 168 hours.  BNP: Invalid input(s): POCBNP  CBG: No results for input(s): GLUCAP in the last 168 hours.  Microbiology: Results for orders placed or performed during the hospital encounter of 06/26/19  SARS Coronavirus 2 (CEPHEID - Performed in [place] hospital lab), Hosp Order     Status: None   Collection Time: 06/26/19  5:31 PM   Specimen: Nasopharyngeal Swab  Result Value Ref Range Status   SARS Coronavirus 2 NEGATIVE NEGATIVE Final    Comment: (NOTE) If result is NEGATIVE SARS-CoV-2 target nucleic acids are NOT DETECTED. The SARS-CoV-2 RNA is generally detectable in upper and lower  respiratory specimens during the acute phase of infection. The lowest  concentration of SARS-CoV-2 viral copies this assay can detect is 250  copies / mL. A negative result does not preclude SARS-CoV-2 infection  and should not be used as the sole basis for treatment or other  patient management decisions.  A negative result may occur with  improper specimen collection / handling, submission of specimen other  than nasopharyngeal swab, presence of viral mutation(s) within the  areas targeted by this assay, and inadequate number of viral copies  (<250 copies / mL). A negative result must be combined with clinical  observations, patient history, and epidemiological information. If result is POSITIVE SARS-CoV-2 target nucleic acids are DETECTED. The  SARS-CoV-2 RNA is generally detectable in upper and lower  respiratory specimens dur ing the acute phase of infection.  Positive  results are indicative of active infection with SARS-CoV-2.  Clinical  correlation with patient history and other diagnostic information is  necessary to determine patient infection status.  Positive results do  not rule out bacterial infection or co-infection with other viruses. If result is PRESUMPTIVE POSTIVE SARS-CoV-2 nucleic acids MAY BE PRESENT.    A presumptive positive result was obtained on the submitted specimen  and confirmed on repeat testing.  While 2019 novel coronavirus  (SARS-CoV-2) nucleic acids may be present in the submitted sample  additional confirmatory testing may be necessary for epidemiological  and / or clinical management purposes  to differentiate between  SARS-CoV-2 and other Sarbecovirus currently known to infect humans.  If clinically indicated additional testing with an alternate test  methodology [phone]) is advised. The SARS-CoV-2 RNA is generally  detectable in upper and lower respiratory sp ecimens during the acute  phase of infection. The expected result is Negative. Fact Sheet for Patients:  StrictlyIdeas.no Fact Sheet for Healthcare Providers: BankingDealers.co.za This test is not yet approved or cleared by the [place] FDA and has been authorized for detection and/or diagnosis of SARS-CoV-2 by FDA under an Emergency Use Authorization (EUA).  This EUA will remain in effect (meaning this test can be used) for the duration of the COVID-19 declaration under Section 564(b)(1) of the Act, 21 U.S.C. section 360bbb-3(b)(1), unless the authorization is terminated or revoked sooner. Performed at [facility], [address] [place], [place] [address]     Coagulation Studies: No results for input(s): LABPROT, INR in the last 72 hours.  Urinalysis: Recent Labs    06/26/19 1648  COLORURINE STRAW*  LABSPEC 1.009  PHURINE 5.0  GLUCOSEU NEGATIVE  HGBUR NEGATIVE  BILIRUBINUR NEGATIVE  KETONESUR NEGATIVE  PROTEINUR NEGATIVE  NITRITE NEGATIVE  LEUKOCYTESUR TRACE*      Imaging: No results found.   Medications:   . sodium chloride 75 mL/hr at 06/28/19 0417   . apixaban  5 mg Oral BID  . atorvastatin  10 mg Oral q1800  . calcium carbonate  500 mg Oral BID WC  . cholecalciferol  2,000 Units Oral Daily  . diltiazem  240 mg Oral  Daily  . estradiol  1 Applicatorful Vaginal Daily  . flecainide  100 mg Oral BID  . levothyroxine  100 mcg Oral Q0600  . lisinopril  5 mg Oral Daily  . sodium chloride flush  3 mL Intravenous Once     Assessment/ Plan:  83 y.o. female with a PMHx of asthma, basal cell carcinoma, diverticulosis, atrial fibrillation, fibrocystic breast disease, history of goiter, hypertension, hypothyroidism, sinus node dysfunction status post dual-chamber pacemaker placement, history of small bowel obstruction, who was admitted to [facility] on 06/26/2019 for evaluation of weakness.  1.  Hyponatremia, felt to be hypovolemic as patient was on HCTZ at home. 2.  Hypertension.  Plan: Patient status improving.  Serum sodium up to 126.  Maintain the patient on sodium chloride 75 cc/h at this time.  Continue to hold HCTZ.  Okay to continue diltiazem and lisinopril at this time.  She will need follow-up in our office post discharge.   LOS: 2 Alexius Ellington 7/26/20202:19 PM

## 2019-06-28 NOTE — Progress Notes (Signed)
[place] [facility]                                                                                                                                                                                  Patient Demographics   Hailey Simmons, is a 83 y.o. female, DOB - March 18, 1936, [id number]  Admit date - 06/26/2019   Admitting Physician Lang Snow, NP  Outpatient Primary MD for the patient is Rusty Aus, MD   LOS - 2  Subjective: Patient denies any complaints sodium is improved   Review of Systems:   CONSTITUTIONAL: No documented fever.  Positive fatigue, positive weakness. No weight gain, no weight loss.  EYES: No blurry or double vision.  ENT: No tinnitus. No postnasal drip. No redness of the oropharynx.  RESPIRATORY: No cough, no wheeze, no hemoptysis. No dyspnea.  CARDIOVASCULAR: No chest pain. No orthopnea. No palpitations. No syncope.  GASTROINTESTINAL: No nausea, no vomiting or diarrhea. No abdominal pain. No melena or hematochezia.  GENITOURINARY: No dysuria or hematuria.  ENDOCRINE: No polyuria or nocturia. No heat or cold intolerance.  HEMATOLOGY: No anemia. No bruising. No bleeding.  INTEGUMENTARY: No rashes. No lesions.  MUSCULOSKELETAL: No arthritis. No swelling. No gout.  NEUROLOGIC: No numbness, tingling, or ataxia. No seizure-type activity.  PSYCHIATRIC: No anxiety. No insomnia. No ADD.    Vitals:   Vitals:   06/27/19 1638 06/27/19 1948 06/28/19 0356 06/28/19 0729  BP: (!) 137/56 (!) 124/51 (!) 139/53 (!) 131/52  Pulse: 61 60 (!) 59 67  Resp:  18 12 18   Temp: 98.7 F (37.1 C) 97.7 F (36.5 C) 98.6 F (37 C) 98 F (36.7 C)  TempSrc: Oral Oral Oral Oral  SpO2: 99% 98% 98% 100%  Weight:   75.8 kg   Height:        Wt Readings from Last 3 Encounters:  06/28/19 75.8 kg  12/06/18 74.4 kg  12/02/18 73.5 kg     Intake/Output Summary (Last 24 hours) at 06/28/2019 1314 Last data filed at 06/28/2019 1012 Gross per  24 hour  Intake 2504.84 ml  Output 2200 ml  Net 304.84 ml    Physical Exam:   GENERAL: Pleasant-appearing in no apparent distress.  HEAD, EYES, EARS, NOSE AND THROAT: Atraumatic, normocephalic. Extraocular muscles are intact. Pupils equal and reactive to light. Sclerae anicteric. No conjunctival injection. No oro-pharyngeal erythema.  NECK: Supple. There is no jugular venous distention. No bruits, no lymphadenopathy, no thyromegaly.  HEART: Regular rate and rhythm,. No murmurs, no rubs, no clicks.  LUNGS: Clear to auscultation bilaterally. No rales or rhonchi. No wheezes.  ABDOMEN: Soft, flat, nontender, nondistended. Has good bowel  sounds. No hepatosplenomegaly appreciated.  EXTREMITIES: No evidence of any cyanosis, clubbing, or peripheral edema.  +2 pedal and radial pulses bilaterally.  NEUROLOGIC: The patient is alert, awake, and oriented x3 with no focal motor or sensory deficits appreciated bilaterally.  SKIN: Moist and warm with no rashes appreciated.  Psych: Not anxious, depressed LN: No inguinal LN enlargement    Antibiotics   Anti-infectives (From admission, onward)   None      Medications   Scheduled Meds: . apixaban  5 mg Oral BID  . atorvastatin  10 mg Oral q1800  . calcium carbonate  500 mg Oral BID WC  . cholecalciferol  2,000 Units Oral Daily  . diltiazem  240 mg Oral Daily  . estradiol  1 Applicatorful Vaginal Daily  . flecainide  100 mg Oral BID  . levothyroxine  100 mcg Oral Q0600  . lisinopril  5 mg Oral Daily  . sodium chloride flush  3 mL Intravenous Once   Continuous Infusions: . sodium chloride 75 mL/hr at 06/28/19 0417   PRN Meds:.   Data Review:   Micro Results Recent Results (from the past 240 hour(s))  SARS Coronavirus 2 (CEPHEID - Performed in [place] hospital lab), Hosp Order     Status: None   Collection Time: 06/26/19  5:31 PM   Specimen: Nasopharyngeal Swab  Result Value Ref Range Status   SARS Coronavirus 2 NEGATIVE NEGATIVE  Final    Comment: (NOTE) If result is NEGATIVE SARS-CoV-2 target nucleic acids are NOT DETECTED. The SARS-CoV-2 RNA is generally detectable in upper and lower  respiratory specimens during the acute phase of infection. The lowest  concentration of SARS-CoV-2 viral copies this assay can detect is 250  copies / mL. A negative result does not preclude SARS-CoV-2 infection  and should not be used as the sole basis for treatment or other  patient management decisions.  A negative result may occur with  improper specimen collection / handling, submission of specimen other  than nasopharyngeal swab, presence of viral mutation(s) within the  areas targeted by this assay, and inadequate number of viral copies  (<250 copies / mL). A negative result must be combined with clinical  observations, patient history, and epidemiological information. If result is POSITIVE SARS-CoV-2 target nucleic acids are DETECTED. The SARS-CoV-2 RNA is generally detectable in upper and lower  respiratory specimens dur ing the acute phase of infection.  Positive  results are indicative of active infection with SARS-CoV-2.  Clinical  correlation with patient history and other diagnostic information is  necessary to determine patient infection status.  Positive results do  not rule out bacterial infection or co-infection with other viruses. If result is PRESUMPTIVE POSTIVE SARS-CoV-2 nucleic acids MAY BE PRESENT.   A presumptive positive result was obtained on the submitted specimen  and confirmed on repeat testing.  While 2019 novel coronavirus  (SARS-CoV-2) nucleic acids may be present in the submitted sample  additional confirmatory testing may be necessary for epidemiological  and / or clinical management purposes  to differentiate between  SARS-CoV-2 and other Sarbecovirus currently known to infect humans.  If clinically indicated additional testing with an alternate test  methodology [phone]) is advised. The  SARS-CoV-2 RNA is generally  detectable in upper and lower respiratory sp ecimens during the acute  phase of infection. The expected result is Negative. Fact Sheet for Patients:  StrictlyIdeas.no Fact Sheet for Healthcare Providers: BankingDealers.co.za This test is not yet approved or cleared by the [place] FDA  and has been authorized for detection and/or diagnosis of SARS-CoV-2 by FDA under an Emergency Use Authorization (EUA).  This EUA will remain in effect (meaning this test can be used) for the duration of the COVID-19 declaration under Section 564(b)(1) of the Act, 21 U.S.C. section 360bbb-3(b)(1), unless the authorization is terminated or revoked sooner. Performed at [facility], [address], [place], [place] [address]     Radiology Reports No results found.   CBC Recent Labs  Lab 06/26/19 1648 06/27/19 0109  WBC 13.5* 12.4*  HGB 12.9 11.2*  HCT 37.2 32.1*  PLT 220 195  MCV 83.8 83.2  MCH 29.1 29.0  MCHC 34.7 34.9  RDW 11.7 11.7  LYMPHSABS  --  1.6  MONOABS  --  0.9  EOSABS  --  0.0  BASOSABS  --  0.0    Chemistries  Recent Labs  Lab 06/27/19 0109 06/27/19 0733 06/27/19 1548 06/27/19 2350 06/28/19 0753  NA 120* 123* 121* 121* 126*  K 3.1* 3.4* 3.9 4.1 4.1  CL 87* 89* 89* 90* 97*  CO2 24 25 25 25 22   GLUCOSE 133* 98 98 96 90  BUN 9 7* 16 15 9   CREATININE 0.57 0.51 0.72 0.71 0.53  CALCIUM 8.2* 8.6* 8.1* 8.2* 8.6*   ------------------------------------------------------------------------------------------------------------------ estimated creatinine clearance is 54.7 mL/min (by C-G formula based on SCr of 0.53 mg/dL). ------------------------------------------------------------------------------------------------------------------ No results for input(s): HGBA1C in the last 72  hours. ------------------------------------------------------------------------------------------------------------------ No results for input(s): CHOL, HDL, LDLCALC, TRIG, CHOLHDL, LDLDIRECT in the last 72 hours. ------------------------------------------------------------------------------------------------------------------ No results for input(s): TSH, T4TOTAL, T3FREE, THYROIDAB in the last 72 hours.  Invalid input(s): FREET3 ------------------------------------------------------------------------------------------------------------------ No results for input(s): VITAMINB12, FOLATE, FERRITIN, TIBC, IRON, RETICCTPCT in the last 72 hours.  Coagulation profile No results for input(s): INR, PROTIME in the last 168 hours.  No results for input(s): DDIMER in the last 72 hours.  Cardiac Enzymes No results for input(s): CKMB, TROPONINI, MYOGLOBIN in the last 168 hours.  Invalid input(s): CK ------------------------------------------------------------------------------------------------------------------ Invalid input(s): POCBNP    Assessment & Plan   83 y.o. female with past medical history of paroxysmal atrial fibrillation, hypertension, hypothyroidism, history of small bowel obstruction, fibrocystic breast disease who presented to the hospital due to generalized weakness and lethargy  1.   Hyponatremia due to HCTZ therapy and dehydration continue IV fluids sodium improving.  She was on both Lasix and HCTZ I would avoid HCTZ for rest of her life.  She can wear compression stockings for lower extremity swelling as needed  2.   Generalized weakness PT evaluation   3. Paroxysmal atrial fibrillation -rate controlled - Continue Cardizem+ flecainide - Continue Eliquis for anticoagulation  4. Hypothyroidism - Continue Synthroid  5. HLD  Continue - Atorvastatin 10mg  PO qhs  6. HTN  - Continue lisinopril and Cardizem as above -HCTZ   discontinued     Code Status Orders   (From admission, onward)         Start     Ordered   06/26/19 1844  Full code  Continuous     06/26/19 1844        Code Status History    Date Active Date Inactive Code Status Order ID Comments User Context   12/07/2018 0230 12/08/2018 2145 Full Code [id number]  Arta Silence, MD Inpatient   06/22/2015 1224 06/23/2015 1312 Full Code [id number]  Deboraha Sprang, MD Inpatient   Advance Care Planning Activity           Consults nephrology  DVT Prophylaxis  Lovenox   Lab Results  Component Value Date   PLT 195 06/27/2019     Time Spent in minutes 35 minutes greater than 50% of time spent in care coordination and counseling patient regarding the condition and plan of care.   Dustin Flock M.D on 06/28/2019 at 1:14 PM  Between 7am to 6pm - Pager - [phone]  After 6pm go to [email or web address] - Proofreader  [employer]   Office  [phone]

## 2019-06-28 NOTE — Plan of Care (Signed)

## 2019-06-29 LAB — BASIC METABOLIC PANEL
Anion gap: 7 (ref 5–15)
Anion gap: 7 (ref 5–15)
BUN: 13 mg/dL (ref 8–23)
BUN: 8 mg/dL (ref 8–23)
CO2: 24 mmol/L (ref 22–32)
CO2: 24 mmol/L (ref 22–32)
Calcium: 8.3 mg/dL — ABNORMAL LOW (ref 8.9–10.3)
Calcium: 8.6 mg/dL — ABNORMAL LOW (ref 8.9–10.3)
Chloride: 103 mmol/L (ref 98–111)
Chloride: 98 mmol/L (ref 98–111)
Creatinine, Ser: 0.53 mg/dL (ref 0.44–1.00)
Creatinine, Ser: 0.56 mg/dL (ref 0.44–1.00)
GFR calc Af Amer: >60 mL/min (ref >60)
GFR calc Af Amer: >60 mL/min (ref >60)
GFR calc non Af Amer: >60 mL/min (ref >60)
GFR calc non Af Amer: >60 mL/min (ref >60)
Glucose, Bld: 93 mg/dL (ref 70–99)
Glucose, Bld: 97 mg/dL (ref 70–99)
Potassium: 3.7 mmol/L (ref 3.5–5.1)
Potassium: 3.8 mmol/L (ref 3.5–5.1)
Sodium: 129 mmol/L — ABNORMAL LOW (ref 135–145)
Sodium: 134 mmol/L — ABNORMAL LOW (ref 135–145)

## 2019-06-29 MED ORDER — LISINOPRIL 5 MG PO TABS: 5.0000 mg | ORAL_TABLET | Freq: Every day | ORAL | 11 refills | Status: DC

## 2019-06-29 NOTE — Progress Notes (Signed)
[name] to be D/C'd Home per MD order. Patient given discharge teaching and paperwork regarding medications, diet, follow-up appointments and activity. Patient understanding verbalized. No questions or complaints at this time. Skin condition as charted. IV and telemetry removed prior to leaving.  No further needs by Care Management/Social Work. Prescriptions e-prescribed to pharmacy.  An After Visit Summary was printed and given to the patient.     Terrilyn Saver

## 2019-06-29 NOTE — Care Management Important Message (Signed)
Important Message  Patient Details  Name: AVERLY ERICSON MRN: [id number] Date of Birth: July 10, 1936   Medicare Important Message Given:  Yes     Dannette Barbara 06/29/2019, 12:05 PM

## 2019-06-29 NOTE — Discharge Summary (Signed)
[facility] [place] [facility], 83 y.o., DOB [birth year], MRN [id number]. Admission date: 06/26/2019 Discharge Date 06/29/2019 Primary MD Rusty Aus, MD Admitting Physician Lang Snow, NP  Admission Diagnosis  Hyponatremia [E87.1] Generalized weakness [R53.1]  Discharge Diagnosis   Active Problems:   Acute hyponatremia due to dehydration and HCTZ therapy Generalized weakness due to hyponatremia now improved Paroxysmal atrial fibrillation Hypothyroidism Hyperlipidemia Hypertension    Hospital Course 83 y.o.femalewith past medical history of paroxysmal atrial fibrillation,hypertension, hypothyroidism, history of small bowel obstruction, fibrocystic breast disease who presented to the hospital due to generalized weakness and lethargy.  Patient was noted to have severe hyponatremia causing her symptoms.  Patient is on HCTZ therapy which was discontinued as well as Lasix was discontinued.  She was given IV fluids.  Second admission for similar presentation.  Recommend not using HCTZ in the future.  Can use TED hose for lower extremity swelling. Patient doing much better denies any complaints             Consults  None  Significant Tests:  See full reports for all details     No results found.     Today   Subjective:   Hailey Simmons patient feeling well denies any complaints Objective:   Blood pressure (!) 139/51, pulse 65, temperature 98.7 F (37.1 C), temperature source Oral, resp. rate 19, height 5\' 3"  (1.6 m), weight 74.8 kg, SpO2 98 %.  .  Intake/Output Summary (Last 24 hours) at 06/29/2019 1333 Last data filed at 06/29/2019 1035 Gross per 24 hour  Intake 2143.75 ml  Output 3300 ml  Net -1156.25 ml    Exam VITAL SIGNS: Blood pressure (!) 139/51, pulse 65, temperature 98.7 F (37.1 C), temperature source Oral, resp. rate 19, height 5\' 3"  (1.6 m), weight 74.8 kg, SpO2 98 %.  GENERAL:  83 y.o.-year-old  patient lying in the bed with no acute distress.  EYES: Pupils equal, round, reactive to light and accommodation. No scleral icterus. Extraocular muscles intact.  HEENT: Head atraumatic, normocephalic. Oropharynx and nasopharynx clear.  NECK:  Supple, no jugular venous distention. No thyroid enlargement, no tenderness.  LUNGS: Normal breath sounds bilaterally, no wheezing, rales,rhonchi or crepitation. No use of accessory muscles of respiration.  CARDIOVASCULAR: S1, S2 normal. No murmurs, rubs, or gallops.  ABDOMEN: Soft, nontender, nondistended. Bowel sounds present. No organomegaly or mass.  EXTREMITIES: No pedal edema, cyanosis, or clubbing.  NEUROLOGIC: Cranial nerves II through XII are intact. Muscle strength 5/5 in all extremities. Sensation intact. Gait not checked.  PSYCHIATRIC: The patient is alert and oriented x 3.  SKIN: No obvious rash, lesion, or ulcer.   Data Review     CBC w Diff:  Lab Results  Component Value Date   WBC 12.4 (H) 06/27/2019   HGB 11.2 (L) 06/27/2019   HGB 12.7 07/29/2018   HCT 32.1 (L) 06/27/2019   HCT 38.9 07/29/2018   PLT 195 06/27/2019   PLT 164 07/29/2018   LYMPHOPCT 13 06/27/2019   MONOPCT 7 06/27/2019   EOSPCT 0 06/27/2019   BASOPCT 0 06/27/2019   CMP:  Lab Results  Component Value Date   NA 134 (L) 06/29/2019   NA 133 (L) 07/29/2018   NA 132 (L) 06/11/2014   K 3.8 06/29/2019   K 3.6 06/11/2014   CL 103 06/29/2019   CL 95 (L) 06/11/2014   CO2 24 06/29/2019   CO2 30 06/11/2014   BUN 8 06/29/2019  BUN 13 07/29/2018   BUN 17 06/11/2014   CREATININE 0.56 06/29/2019   CREATININE 0.88 06/11/2014   PROT 6.7 12/06/2018   ALBUMIN 3.9 12/06/2018   BILITOT 0.5 12/06/2018   ALKPHOS 125 12/06/2018   AST 31 12/06/2018   ALT 29 12/06/2018  .  Micro Results Recent Results (from the past 240 hour(s))  SARS Coronavirus 2 (CEPHEID - Performed in [place] hospital lab), Hosp Order     Status: None   Collection Time: 06/26/19  5:31 PM    Specimen: Nasopharyngeal Swab  Result Value Ref Range Status   SARS Coronavirus 2 NEGATIVE NEGATIVE Final    Comment: (NOTE) If result is NEGATIVE SARS-CoV-2 target nucleic acids are NOT DETECTED. The SARS-CoV-2 RNA is generally detectable in upper and lower  respiratory specimens during the acute phase of infection. The lowest  concentration of SARS-CoV-2 viral copies this assay can detect is 250  copies / mL. A negative result does not preclude SARS-CoV-2 infection  and should not be used as the sole basis for treatment or other  patient management decisions.  A negative result may occur with  improper specimen collection / handling, submission of specimen other  than nasopharyngeal swab, presence of viral mutation(s) within the  areas targeted by this assay, and inadequate number of viral copies  (<250 copies / mL). A negative result must be combined with clinical  observations, patient history, and epidemiological information. If result is POSITIVE SARS-CoV-2 target nucleic acids are DETECTED. The SARS-CoV-2 RNA is generally detectable in upper and lower  respiratory specimens dur ing the acute phase of infection.  Positive  results are indicative of active infection with SARS-CoV-2.  Clinical  correlation with patient history and other diagnostic information is  necessary to determine patient infection status.  Positive results do  not rule out bacterial infection or co-infection with other viruses. If result is PRESUMPTIVE POSTIVE SARS-CoV-2 nucleic acids MAY BE PRESENT.   A presumptive positive result was obtained on the submitted specimen  and confirmed on repeat testing.  While 2019 novel coronavirus  (SARS-CoV-2) nucleic acids may be present in the submitted sample  additional confirmatory testing may be necessary for epidemiological  and / or clinical management purposes  to differentiate between  SARS-CoV-2 and other Sarbecovirus currently known to infect humans.  If  clinically indicated additional testing with an alternate test  methodology [phone]) is advised. The SARS-CoV-2 RNA is generally  detectable in upper and lower respiratory sp ecimens during the acute  phase of infection. The expected result is Negative. Fact Sheet for Patients:  StrictlyIdeas.no Fact Sheet for Healthcare Providers: BankingDealers.co.za This test is not yet approved or cleared by the [place] FDA and has been authorized for detection and/or diagnosis of SARS-CoV-2 by FDA under an Emergency Use Authorization (EUA).  This EUA will remain in effect (meaning this test can be used) for the duration of the COVID-19 declaration under Section 564(b)(1) of the Act, 21 U.S.C. section 360bbb-3(b)(1), unless the authorization is terminated or revoked sooner. Performed at [facility], [address] [place], [place] [address]         Code Status Orders  (From admission, onward)         Start     Ordered   06/26/19 1844  Full code  Continuous     06/26/19 1844        Code Status History    Date Active Date Inactive Code Status Order ID Comments User Context  12/07/2018 0230 12/08/2018 2145 Full Code [id number]  Arta Silence, MD Inpatient   06/22/2015 1224 06/23/2015 1312 Full Code [id number]  Deboraha Sprang, MD Inpatient   Advance Care Planning Activity          Follow-up Information    Rusty Aus, MD On 07/09/2019.   Specialty: Internal Medicine Why: appointment at [place] information: [address] [place] [id number] Wisner, Odell, MD On 07/10/2019.   Specialty: Internal Medicine Why: hyponatremia .......[facility] information: [place] [id number] [phone]           Discharge Medications   Allergies as of 06/29/2019   No Known Allergies     Medication List     STOP taking these medications   furosemide 20 MG tablet Commonly known as: LASIX   hydrochlorothiazide 12.5 MG tablet Commonly known as: HYDRODIURIL     TAKE these medications   apixaban 5 MG Tabs tablet Commonly known as: ELIQUIS Take 1 tablet (5 mg total) by mouth 2 (two) times daily. Resume 06/26/15   atorvastatin 10 MG tablet Commonly known as: LIPITOR TAKE ONE (1) TABLET EACH DAY   calcium carbonate 600 MG Tabs tablet Commonly known as: OS-CAL Take 600 mg by mouth 2 (two) times daily with a meal.   Cranberry 1000 MG Caps Take 1 capsule by mouth daily.   diltiazem 240 MG 24 hr capsule Commonly known as: CARDIZEM CD TAKE 1 CAPSULE BY MOUTH ONCE DAILY   ESTRACE VAGINAL 0.1 MG/GM vaginal cream Generic drug: estradiol Place 1 Applicatorful vaginally as needed.   flecainide 100 MG tablet Commonly known as: TAMBOCOR TAKE ONE TABLET TWICE DAILY   levothyroxine 100 MCG tablet Commonly known as: SYNTHROID Take 100 mcg by mouth daily.   lisinopril 5 MG tablet Commonly known as: ZESTRIL Take 1 tablet (5 mg total) by mouth daily.   polyethylene glycol 17 g packet Commonly known as: MIRALAX / GLYCOLAX Take 17 g by mouth daily.   sodium bicarbonate 650 MG tablet Take 650 mg by mouth daily.   Vitamin D3 50 MCG (2000 UT) Tabs Take 1 tablet by mouth daily.          Total Time in preparing paper work, data evaluation and todays exam - 73 minutes  Dustin Flock M.D on 06/29/2019 at [place]  [phone]

## 2019-06-29 NOTE — Progress Notes (Signed)
[facility]  ROUNDING NOTE   Subjective:   Patient worked with PT this morning. Walked around the nurses station with no difficulty.   Na 134  NS at 75  Objective:  Vital signs in last 24 hours:  Temp:  [97.7 F (36.5 C)-98.7 F (37.1 C)] 98.7 F (37.1 C) (07/27 0811) Pulse Rate:  [62-68] 65 (07/27 0811) Resp:  [16-19] 19 (07/27 0811) BP: (139-148)/(51-58) 139/51 (07/27 0811) SpO2:  [97 %-99 %] 98 % (07/27 0811) Weight:  [74.8 kg] 74.8 kg (07/27 0502)  Weight change: -1.089 kg Filed Weights   06/27/19 0129 06/28/19 0356 06/29/19 0502  Weight: 73.8 kg 75.8 kg 74.8 kg    Intake/Output: I/O last 3 completed shifts: In: 4166 [P.O.:600; I.V.:2575] Out: 5050 [Urine:5050]   Intake/Output this shift:  Total I/O In: 120 [P.O.:120] Out: 300 [Urine:300]  Physical Exam: General: NAD, sitting in chair  Head: Normocephalic, atraumatic. Moist oral mucosal membranes  Eyes: Anicteric, PERRL  Neck: Supple, trachea midline  Lungs:  Clear to auscultation  Heart: Regular rate and rhythm  Abdomen:  Soft, nontender,   Extremities: no peripheral edema.  Neurologic: Nonfocal, moving all four extremities  Skin: No lesions        Basic Metabolic Panel: Recent Labs  Lab 06/27/19 2350 06/28/19 0753 06/28/19 1556 06/29/19 0007 06/29/19 0747  NA 121* 126* 125* 129* 134*  K 4.1 4.1 4.0 3.7 3.8  CL 90* 97* 94* 98 103  CO2 25 22 23 24 24   GLUCOSE 96 90 113* 97 93  BUN 15 9 13 13 8   CREATININE 0.71 0.53 0.72 0.53 0.56  CALCIUM 8.2* 8.6* 8.5* 8.3* 8.6*    Liver Function Tests: No results for input(s): AST, ALT, ALKPHOS, BILITOT, PROT, ALBUMIN in the last 168 hours. No results for input(s): LIPASE, AMYLASE in the last 168 hours. No results for input(s): AMMONIA in the last 168 hours.  CBC: Recent Labs  Lab 06/26/19 1648 06/27/19 0109  WBC 13.5* 12.4*  NEUTROABS  --  9.9*  HGB 12.9 11.2*  HCT 37.2 32.1*  MCV 83.8 83.2  PLT 220 195    Cardiac  Enzymes: No results for input(s): CKTOTAL, CKMB, CKMBINDEX, TROPONINI in the last 168 hours.  BNP: Invalid input(s): POCBNP  CBG: No results for input(s): GLUCAP in the last 168 hours.  Microbiology: Results for orders placed or performed during the hospital encounter of 06/26/19  SARS Coronavirus 2 (CEPHEID - Performed in [place] hospital lab), Hosp Order     Status: None   Collection Time: 06/26/19  5:31 PM   Specimen: Nasopharyngeal Swab  Result Value Ref Range Status   SARS Coronavirus 2 NEGATIVE NEGATIVE Final    Comment: (NOTE) If result is NEGATIVE SARS-CoV-2 target nucleic acids are NOT DETECTED. The SARS-CoV-2 RNA is generally detectable in upper and lower  respiratory specimens during the acute phase of infection. The lowest  concentration of SARS-CoV-2 viral copies this assay can detect is 250  copies / mL. A negative result does not preclude SARS-CoV-2 infection  and should not be used as the sole basis for treatment or other  patient management decisions.  A negative result may occur with  improper specimen collection / handling, submission of specimen other  than nasopharyngeal swab, presence of viral mutation(s) within the  areas targeted by this assay, and inadequate number of viral copies  (<250 copies / mL). A negative result must be combined with clinical  observations, patient history, and epidemiological information. If result  is POSITIVE SARS-CoV-2 target nucleic acids are DETECTED. The SARS-CoV-2 RNA is generally detectable in upper and lower  respiratory specimens dur ing the acute phase of infection.  Positive  results are indicative of active infection with SARS-CoV-2.  Clinical  correlation with patient history and other diagnostic information is  necessary to determine patient infection status.  Positive results do  not rule out bacterial infection or co-infection with other viruses. If result is PRESUMPTIVE POSTIVE SARS-CoV-2 nucleic acids MAY  BE PRESENT.   A presumptive positive result was obtained on the submitted specimen  and confirmed on repeat testing.  While 2019 novel coronavirus  (SARS-CoV-2) nucleic acids may be present in the submitted sample  additional confirmatory testing may be necessary for epidemiological  and / or clinical management purposes  to differentiate between  SARS-CoV-2 and other Sarbecovirus currently known to infect humans.  If clinically indicated additional testing with an alternate test  methodology [phone]) is advised. The SARS-CoV-2 RNA is generally  detectable in upper and lower respiratory sp ecimens during the acute  phase of infection. The expected result is Negative. Fact Sheet for Patients:  StrictlyIdeas.no Fact Sheet for Healthcare Providers: BankingDealers.co.za This test is not yet approved or cleared by the [place] FDA and has been authorized for detection and/or diagnosis of SARS-CoV-2 by FDA under an Emergency Use Authorization (EUA).  This EUA will remain in effect (meaning this test can be used) for the duration of the COVID-19 declaration under Section 564(b)(1) of the Act, 21 U.S.C. section 360bbb-3(b)(1), unless the authorization is terminated or revoked sooner. Performed at [facility], [address] [place], [place] [address]     Coagulation Studies: No results for input(s): LABPROT, INR in the last 72 hours.  Urinalysis: Recent Labs    06/26/19 1648  COLORURINE STRAW*  LABSPEC 1.009  PHURINE 5.0  GLUCOSEU NEGATIVE  HGBUR NEGATIVE  BILIRUBINUR NEGATIVE  KETONESUR NEGATIVE  PROTEINUR NEGATIVE  NITRITE NEGATIVE  LEUKOCYTESUR TRACE*      Imaging: No results found.   Medications:    . apixaban  5 mg Oral BID  . atorvastatin  10 mg Oral q1800  . calcium carbonate  500 mg Oral BID WC  . cholecalciferol  2,000 Units Oral Daily  . diltiazem  240 mg Oral Daily  . estradiol  1  Applicatorful Vaginal Daily  . flecainide  100 mg Oral BID  . levothyroxine  100 mcg Oral Q0600  . lisinopril  5 mg Oral Daily  . sodium chloride flush  3 mL Intravenous Once     Assessment/ Plan:  Hailey Simmons is a 83 y.o. white female with COPD/asthma, basal cell carcinoma, diverticulosis, atrial fibrillation, fibrocystic breast disease, hypertension, hypothyroidism, sinus node dysfunction status post dual-chamber pacemaker placement, history of small bowel obstruction, who was admitted to [facility] on 06/26/2019 for hyponatremia.   1. Hyponatremia: secondary to hydrochlorothiazide. Improved with IV saline infusion.  - Discontinue hydrochlorothiazide at home.   2. Hypertension: 139/51. At goal.  - Continue lisinopril and diltiazem.    LOS: 3 Brylee Berk 7/27/202011:25 AM

## 2019-06-29 NOTE — Plan of Care (Signed)
  Problem: Education: Goal: Knowledge of General Education information will improve Description: Including pain rating scale, medication(s)/side effects and non-pharmacologic comfort measures Outcome: Progressing   Problem: Clinical Measurements: Goal: Ability to maintain clinical measurements within normal limits will improve Outcome: Progressing   Problem: Safety: Goal: Ability to remain free from injury will improve Outcome: Progressing   

## 2019-06-29 NOTE — Evaluation (Signed)
Physical Therapy Evaluation Patient Details Name: Hailey Simmons MRN: [id number] DOB: 05-25-1936 Today's Date: 06/29/2019   History of Present Illness  83 y.o. female with a PMHx of asthma, basal cell carcinoma, diverticulosis, atrial fibrillation, fibrocystic breast disease, history of goiter, hypertension, hypothyroidism, sinus node dysfunction status post dual-chamber pacemaker placement, history of small bowel obstruction, who was admitted to [facility] on 06/26/2019 for evaluation of weakness, workup found hyponatremia, currently Na+: 134    Clinical Impression  Pt A&Ox4, no complaints of pain, provided clear PLOF including no AD at baseline for ambulation, lives with her husband in a one story home, previously independent, 1 fall in the last 6 months where she tripped over a cord.  Pt demonstrated bed mobility and transfer mod I (use of hands) as well as utilized standard commode mod I. Pt ambulated ~153ft without AD mod I as well, mildly decreased gait velocity but no unsteadiness/staggering noted. The patient demonstrated and reported return to baseline level of functioning, no further acute PT needs indicated. PT to sign off. Please reconsult PT if pt status changes or acute needs are identified.      Follow Up Recommendations No PT follow up    Equipment Recommendations  None recommended by PT    Recommendations for Other Services       Precautions / Restrictions Precautions Precautions: None Restrictions Weight Bearing Restrictions: No      Mobility  Bed Mobility Overal bed mobility: Modified Independent                Transfers Overall transfer level: Modified independent                  Ambulation/Gait Ambulation/Gait assistance: Modified independent (Device/Increase time) Gait Distance (Feet): 170 Feet            Stairs            Wheelchair Mobility    Modified Rankin (Stroke Patients Only)       Balance Overall balance assessment:  No apparent balance deficits (not formally assessed)                                           Pertinent Vitals/Pain Pain Assessment: No/denies pain    Home Living Family/patient expects to be discharged to:: Private residence Living Arrangements: Spouse/significant other Available Help at Discharge: Family;Available 24 hours/day Type of Home: House Home Access: Stairs to enter   [employer] of Steps: 1 Home Layout: One level Home Equipment: Walker - 4 wheels;Grab bars - tub/shower      Prior Function Level of Independence: Independent         Comments: drives, cooks, stated she had one fall in the last 6 months where she tripped over a cord     Hand Dominance   Dominant Hand: Right    Extremity/Trunk Assessment   Upper Extremity Assessment Upper Extremity Assessment: Overall WFL for tasks assessed    Lower Extremity Assessment Lower Extremity Assessment: Overall WFL for tasks assessed    Cervical / Trunk Assessment Cervical / Trunk Assessment: Normal  Communication   Communication: No difficulties  Cognition Arousal/Alertness: Awake/alert Behavior During Therapy: WFL for tasks assessed/performed Overall Cognitive Status: Within Functional Limits for tasks assessed  General Comments      Exercises Other Exercises Other Exercises: Pt demonstrated standard commode transfer mod I during session as well, able to navigate with IV pole   Assessment/Plan    PT Assessment Patent does not need any further PT services  PT Problem List         PT Treatment Interventions      PT Goals (Current goals can be found in the Care Plan section)       Frequency     Barriers to discharge        Co-evaluation               AM-PAC PT "6 Clicks" Mobility  Outcome Measure Help needed turning from your back to your side while in a flat bed without using bedrails?: None Help  needed moving from lying on your back to sitting on the side of a flat bed without using bedrails?: None Help needed moving to and from a bed to a chair (including a wheelchair)?: None Help needed standing up from a chair using your arms (e.g., wheelchair or bedside chair)?: None Help needed to walk in hospital room?: None Help needed climbing 3-5 steps with a railing? : A Little 6 Click Score: 23    End of Session Equipment Utilized During Treatment: Gait belt Activity Tolerance: Patient tolerated treatment well Patient left: in chair;with chair alarm set;with call bell/phone within reach Nurse Communication: Mobility status PT Visit Diagnosis: Other abnormalities of gait and mobility (R26.89);Muscle weakness (generalized) (M62.81)    Time: 8937-3428 PT Time Calculation (min) (ACUTE ONLY): 10 min   Charges:   PT Evaluation $PT Eval Low Complexity: 1 Low          Lieutenant Diego PT, DPT 10:42 AM,06/29/19 [phone]

## 2019-07-03 DIAGNOSIS — E039 Hypothyroidism, unspecified: Secondary | ICD-10-CM | POA: Diagnosis not present

## 2019-07-03 DIAGNOSIS — I480 Paroxysmal atrial fibrillation: Secondary | ICD-10-CM | POA: Diagnosis not present

## 2019-07-03 DIAGNOSIS — E871 Hypo-osmolality and hyponatremia: Secondary | ICD-10-CM | POA: Diagnosis not present

## 2019-07-10 DIAGNOSIS — R600 Localized edema: Secondary | ICD-10-CM | POA: Diagnosis not present

## 2019-07-10 DIAGNOSIS — E871 Hypo-osmolality and hyponatremia: Secondary | ICD-10-CM | POA: Diagnosis not present

## 2019-07-10 DIAGNOSIS — I10 Essential (primary) hypertension: Secondary | ICD-10-CM | POA: Diagnosis not present

## 2019-07-16 ENCOUNTER — Ambulatory Visit (INDEPENDENT_AMBULATORY_CARE_PROVIDER_SITE_OTHER): Payer: PPO | Admitting: *Deleted

## 2019-07-16 DIAGNOSIS — I495 Sick sinus syndrome: Secondary | ICD-10-CM

## 2019-07-17 LAB — CUP PACEART REMOTE DEVICE CHECK
Battery Remaining Longevity: 80 mo
Battery Voltage: 3.01 V
Brady Statistic AP VP Percent: 0.04 %
Brady Statistic AP VS Percent: 66.55 %
Brady Statistic AS VP Percent: 0.02 %
Brady Statistic AS VS Percent: 33.39 %
Brady Statistic RA Percent Paced: 66.59 %
Brady Statistic RV Percent Paced: 0.06 %
Date Time Interrogation Session: 20200813233451
Implantable Lead Implant Date: 20160720
Implantable Lead Implant Date: 20160720
Implantable Lead Location: 753859
Implantable Lead Location: 753860
Implantable Lead Model: 5076
Implantable Lead Model: 5076
Implantable Pulse Generator Implant Date: 20160720
Lead Channel Impedance Value: 342 Ohm
Lead Channel Impedance Value: 456 Ohm
Lead Channel Impedance Value: 494 Ohm
Lead Channel Impedance Value: 532 Ohm
Lead Channel Pacing Threshold Amplitude: 0.75 V
Lead Channel Pacing Threshold Amplitude: 1.125 V
Lead Channel Pacing Threshold Pulse Width: 0.4 ms
Lead Channel Pacing Threshold Pulse Width: 0.4 ms
Lead Channel Sensing Intrinsic Amplitude: 1 mV
Lead Channel Sensing Intrinsic Amplitude: 1 mV
Lead Channel Sensing Intrinsic Amplitude: 8.25 mV
Lead Channel Sensing Intrinsic Amplitude: 8.25 mV
Lead Channel Setting Pacing Amplitude: 2.25 V
Lead Channel Setting Pacing Amplitude: 2.5 V
Lead Channel Setting Pacing Pulse Width: 0.4 ms
Lead Channel Setting Sensing Sensitivity: 1.2 mV

## 2019-07-20 DIAGNOSIS — E871 Hypo-osmolality and hyponatremia: Secondary | ICD-10-CM | POA: Diagnosis not present

## 2019-07-24 ENCOUNTER — Other Ambulatory Visit: Payer: Self-pay | Admitting: Internal Medicine

## 2019-07-24 DIAGNOSIS — Z1231 Encounter for screening mammogram for malignant neoplasm of breast: Secondary | ICD-10-CM

## 2019-07-27 ENCOUNTER — Encounter: Payer: Self-pay | Admitting: Cardiology

## 2019-07-27 NOTE — Progress Notes (Signed)
Remote pacemaker transmission.   

## 2019-08-03 DIAGNOSIS — E871 Hypo-osmolality and hyponatremia: Secondary | ICD-10-CM | POA: Diagnosis not present

## 2019-08-26 ENCOUNTER — Ambulatory Visit
Admission: RE | Admit: 2019-08-26 | Discharge: 2019-08-26 | Disposition: A | Discharge status: Home / Self Care | Payer: PPO | Source: Ambulatory Visit | Attending: Internal Medicine | Admitting: Internal Medicine

## 2019-08-26 DIAGNOSIS — Z1231 Encounter for screening mammogram for malignant neoplasm of breast: Secondary | ICD-10-CM

## 2019-09-02 ENCOUNTER — Other Ambulatory Visit: Payer: Self-pay | Admitting: Cardiovascular Disease

## 2019-09-02 NOTE — Telephone Encounter (Signed)
Pt is now scheduled with Caryl Comes 10/6 and [facility] 10/20

## 2019-09-02 NOTE — Telephone Encounter (Signed)
Pt overdue for 12 month f/u last seen 7/19. Please contact pt for future appointment.

## 2019-09-04 DIAGNOSIS — E871 Hypo-osmolality and hyponatremia: Secondary | ICD-10-CM | POA: Diagnosis not present

## 2019-09-04 DIAGNOSIS — R600 Localized edema: Secondary | ICD-10-CM | POA: Diagnosis not present

## 2019-09-04 DIAGNOSIS — I10 Essential (primary) hypertension: Secondary | ICD-10-CM | POA: Diagnosis not present

## 2019-09-08 ENCOUNTER — Encounter: Payer: Self-pay | Admitting: Internal Medicine

## 2019-09-08 ENCOUNTER — Ambulatory Visit (INDEPENDENT_AMBULATORY_CARE_PROVIDER_SITE_OTHER): Payer: PPO | Admitting: Internal Medicine

## 2019-09-08 ENCOUNTER — Other Ambulatory Visit: Payer: Self-pay

## 2019-09-08 VITALS — BP 142/62 | HR 68 | Temp 97.1°F | Ht 63.0 in | Wt 164.8 lb

## 2019-09-08 DIAGNOSIS — Z950 Presence of cardiac pacemaker: Secondary | ICD-10-CM | POA: Diagnosis not present

## 2019-09-08 DIAGNOSIS — I480 Paroxysmal atrial fibrillation: Secondary | ICD-10-CM

## 2019-09-08 DIAGNOSIS — I495 Sick sinus syndrome: Secondary | ICD-10-CM

## 2019-09-08 NOTE — Progress Notes (Signed)
Patient Care Team: Rusty Aus, MD as PCP - General (Unknown Physician Specialty) [name], MD as Consulting Physician (Cardiology)   HPI  Hailey Simmons is a 83 y.o. female Seen in followup for pacer implanted for sinus node dysfunction and PAF  Hospitalized 7/20 with severe hyponatremia (118) diuretics were discontinued.  She has been followed by renal.  With recent sodium of 137.  She has had some edema.  Her blood pressure she has been started on Benicar.  No interval atrial fibrillation of which she is aware.  No overt bleeding    DATE TEST    2/13    Echo   EF 60 %   8/17    Myoview   EF 55-60 % No Ischemia        Date Cr NA Hgb  2/18 0.9  12.6  8/18  0.75  12.5  2/19 0.8  12.8  7/20 .57 118 11.2>>11.9  10/20  137      DATE PR duration QRSduration Dose-flecianide  3/13 160 134 0  7/15  158 50  10/16 180 154 100  6/17`  146 100  7/18 (A paced) 280 140 100  8/19 Apaced 240 142 100   9/20 Apaced 236 154 [id number]   Thromboembolic risk factors ( age -12, HTN-1, Gender-1) for a CHADSVASc Score of 4   Past Medical History:  Diagnosis Date  . Asthma 1970's - 1990's   a. ? 2/2 wood stove usage.  . Basal cell carcinoma of skin of nose   . Diverticulosis   . Dysrhythmia    A-FIB  . Goiter   . History of chicken pox   . Hx of colonic polyps   . Hypertension   . Hypothyroidism   . Menopause   . PAF (paroxysmal atrial fibrillation) ([place])    on apixoban  . Sinus node dysfunction (HCC)    a. s/p MDT dual chamber pacemaker  . Small bowel obstruction ([place]) 08/2009    Past Surgical History:  Procedure Laterality Date  . COLONOSCOPY    . COLONOSCOPY WITH PROPOFOL N/A 12/02/2018   Procedure: COLONOSCOPY WITH PROPOFOL;  Surgeon: Lollie Sails, MD;  Location: [facility] ENDOSCOPY;  Service: Endoscopy;  Laterality: N/A;  . EP IMPLANTABLE DEVICE N/A 06/22/2015   MDT MRI compatible dual chamber PPM implanted by Dr Caryl Comes for symptomatic bradycardia  .  INSERT / REPLACE / REMOVE PACEMAKER      Current Outpatient Medications  Medication Sig Dispense Refill  . apixaban (ELIQUIS) 5 MG TABS tablet Take 1 tablet (5 mg total) by mouth 2 (two) times daily. Resume 06/26/15 60 tablet 6  . atorvastatin (LIPITOR) 10 MG tablet TAKE ONE (1) TABLET EACH DAY 90 tablet 3  . calcium carbonate (OS-CAL) 600 MG TABS Take 600 mg by mouth 2 (two) times daily with a meal.    . Cholecalciferol (VITAMIN D3) 2000 UNITS TABS Take 1 tablet by mouth daily.    . Cranberry 1000 MG CAPS Take 1 capsule by mouth daily.     Marland Kitchen diltiazem (CARDIZEM CD) 240 MG 24 hr capsule TAKE 1 CAPSULE BY MOUTH ONCE DAILY 90 capsule 0  . ESTRACE VAGINAL 0.1 MG/GM vaginal cream Place 1 Applicatorful vaginally as needed.     . flecainide (TAMBOCOR) 100 MG tablet TAKE ONE TABLET TWICE DAILY 180 tablet 3  . levothyroxine (SYNTHROID, LEVOTHROID) 100 MCG tablet Take 100 mcg by mouth daily.    Marland Kitchen olmesartan (BENICAR) 40 MG  tablet Take 40 mg by mouth daily.     . polyethylene glycol (MIRALAX / GLYCOLAX) 17 g packet Take 17 g by mouth daily.     No current facility-administered medications for this visit.     No Known Allergies    Review of Systems negative except from HPI and PMH  Physical Exam  BP (!) 142/62 (BP Location: Left Arm, Patient Position: Sitting, Cuff Size: Normal)   Pulse 68   Temp (!) 97.1 F (36.2 C)   Ht 5\' 3"  (1.6 m)   Wt 164 lb 12 oz (74.7 kg)   BMI 29.18 kg/m   Well developed and well nourished in no acute distress HENT normal Neck supple with JVP-flat Clear Device pocket well healed; without hematoma or erythema.  There is no tethering  Regular rate and rhythm, no  gallop No  murmur Abd-soft with active BS No Clubbing cyanosis 1+ edema Skin-warm and dry A & Oriented  Grossly normal sensory and motor function  ECG Apcd 68 24/15/46    Assessment and  Plan  Atrial fibrillation-paroxysmal  Sinus node dysfunction  Pacemaker-Medtronic  The patient's  device was interrogated.  The information was reviewed. No changes were made in the programming.     Hyponatremia  Right bundle branch block  Hypertension   \BP reasonably controlled  No off diuretics  Scant afib  On Anticoagulation;  No bleeding issues   Hgb drifting will need to keep close followup  Last Na was normal  Have reached out to [facility] re followup

## 2019-09-08 NOTE — Patient Instructions (Signed)
Medication Instructions:  - Your physician recommends that you continue on your current medications as directed. Please refer to the Current Medication list given to you today.  Samples given today: Eliquis 5 mg Lot: [id number] Exp: 12/22 # 3 boxes  If you need a refill on your cardiac medications before your next appointment, please call your pharmacy.   Lab work: - none ordered  If you have labs (blood work) drawn today and your tests are completely normal, you will receive your results only by: Marland Kitchen MyChart Message (if you have MyChart) OR . A paper copy in the mail If you have any lab test that is abnormal or we need to change your treatment, we will call you to review the results.  Testing/Procedures: -none ordered  Follow-Up: At [facility], you and your health needs are our priority.  As part of our continuing mission to provide you with exceptional heart care, we have created designated Provider Care Teams.  These Care Teams include your primary Cardiologist (physician) and Advanced Practice Providers (APPs -  Physician Assistants and Nurse Practitioners) who all work together to provide you with the care you need, when you need it.  . You will need a follow up appointment in 6 months (April 2021) with Dr. Caryl Comes . Please call our office 2 months in advance to schedule this appointment.  (Call in early February to schedule)  Any Other Special Instructions Will Be Listed Below (If Applicable). - N/A

## 2019-09-18 DIAGNOSIS — E871 Hypo-osmolality and hyponatremia: Secondary | ICD-10-CM | POA: Diagnosis not present

## 2019-09-20 NOTE — Progress Notes (Addendum)
InPatient ID: Hailey Simmons, female   DOB: [birth year], 83 y.o.   MRN: [id number] Cardiology Office Note  Date:  09/22/2019   ID:  Hailey Simmons, DOB 11-Jun-1936, MRN [id number]  PCP:  Hailey Aus, MD   Chief Complaint  Patient presents with  . Other    12 month follow up. Patient c/o some swelling in legs. meds reviewed verbally with patient.     HPI:  Hailey Simmons is a very pleasant 83 year old woman with history of hypothyroidism,  hyperlipidemia ,  paroxysmal atrial fibrillation January 2013 Pacer for sinus node dysfunction Echocardiogram showed normal ejection fraction On diltiazem and flecainide CHA2DS2-VASc Score 4 She presents today for follow-up of her atrial fibrillation  Weight up 9 pounds Eating more, less active  Pacer downloads reviewed with her in detail 2 AF, longest 6 mins.  No sx In general does not have a lot of atrial fibrillation on review of multiple downloads  BP "up all the time"  In hospital 06/2019 Low sodium, 118 Since then has held her diuretics Primary care has kept her on her salt supplement to maintain sodium level  Reports having worsening lower extremity edema No diuretic available and has been instructed not to take this  Active with grandchildren  Lipid Panel , reviewed with her in detail Total cholesterol 139 LDL 63  EKG personally reviewed by myself on todays visit Shows paced, normal sinus rhythm rate 65 bpm right bundle branch block  Other past medical history  periodic urinary tract infections  She is typically very active, takes care of 3 grandchildren several days per week.     PMH:   has a past medical history of Asthma (1970's - 1990's), Basal cell carcinoma of skin of nose, Diverticulosis, Dysrhythmia, Goiter, History of chicken pox, colonic polyps, Hypertension, Hypothyroidism, Menopause, PAF (paroxysmal atrial fibrillation) ([place]), Sinus node dysfunction ([place]), and Small bowel obstruction ([place]) (08/2009).  PSH:     Past Surgical History:  Procedure Laterality Date  . CATARACT EXTRACTION  02/2019  . CATARACT EXTRACTION  04/2019  . COLONOSCOPY    . COLONOSCOPY WITH PROPOFOL N/A 12/02/2018   Procedure: COLONOSCOPY WITH PROPOFOL;  Surgeon: Lollie Sails, MD;  Location: [facility] ENDOSCOPY;  Service: Endoscopy;  Laterality: N/A;  . EP IMPLANTABLE DEVICE N/A 06/22/2015   MDT MRI compatible dual chamber PPM implanted by Dr Caryl Comes for symptomatic bradycardia  . INSERT / REPLACE / REMOVE PACEMAKER      Current Outpatient Medications  Medication Sig Dispense Refill  . apixaban (ELIQUIS) 5 MG TABS tablet Take 1 tablet (5 mg total) by mouth 2 (two) times daily. Resume 06/26/15 60 tablet 6  . atorvastatin (LIPITOR) 10 MG tablet TAKE ONE (1) TABLET EACH DAY 90 tablet 3  . calcium carbonate (OS-CAL) 600 MG TABS Take 600 mg by mouth 2 (two) times daily with a meal.    . Cholecalciferol (VITAMIN D3) 2000 UNITS TABS Take 1 tablet by mouth daily.    . Cranberry 1000 MG CAPS Take 1 capsule by mouth daily.     Marland Kitchen diltiazem (CARDIZEM CD) 240 MG 24 hr capsule TAKE 1 CAPSULE BY MOUTH ONCE DAILY 90 capsule 0  . ESTRACE VAGINAL 0.1 MG/GM vaginal cream Place 1 Applicatorful vaginally as needed.     . flecainide (TAMBOCOR) 100 MG tablet TAKE ONE TABLET TWICE DAILY 180 tablet 3  . levothyroxine (SYNTHROID, LEVOTHROID) 100 MCG tablet Take 100 mcg by mouth daily.    Marland Kitchen olmesartan (BENICAR) 40 MG tablet Take  40 mg by mouth daily.     . polyethylene glycol (MIRALAX / GLYCOLAX) 17 g packet Take 17 g by mouth daily.     No current facility-administered medications for this visit.      Allergies:   Patient has no known allergies.   Social History:  The patient  reports that she has never smoked. She has never used smokeless tobacco. She reports that she does not drink alcohol or use drugs.   Family History:   family history includes Breast cancer in her maternal aunt; Heart attack in her father; Heart failure in her mother.     Review of Systems: Review of Systems  Constitutional: Negative.   HENT: Negative.   Respiratory: Negative.   Cardiovascular: Positive for leg swelling.  Gastrointestinal: Negative.   Musculoskeletal: Negative.   Neurological: Negative.   Psychiatric/Behavioral: Negative.   All other systems reviewed and are negative.   PHYSICAL EXAM: VS:  BP (!) 156/60 (BP Location: Left Arm, Patient Position: Sitting, Cuff Size: Normal)   Pulse 65   Ht 5\' 4"  (1.626 m)   Wt 168 lb 8 oz (76.4 kg)   BMI 28.92 kg/m  , BMI Body mass index is 28.92 kg/m. Constitutional:  oriented to person, place, and time. No distress.  HENT:  Head: Grossly normal Eyes:  no discharge. No scleral icterus.  Neck: No JVD, no carotid bruits  Cardiovascular: Regular rate and rhythm, 1-2/6 LSB murmurs  Trace to 1 + pitting edema b/l LE Pulmonary/Chest: Clear to auscultation bilaterally, no wheezes or rails Abdominal: Soft.  no distension.  no tenderness.  Musculoskeletal: Normal range of motion Neurological:  normal muscle tone. Coordination normal. No atrophy Skin: Skin warm and dry Psychiatric: normal affect, pleasant   Recent Labs: 12/06/2018: ALT 29 12/07/2018: Magnesium 1.9; TSH 1.672 06/27/2019: Hemoglobin 11.2; Platelets 195 06/29/2019: BUN 8; Creatinine, Ser 0.56; Potassium 3.8; Sodium 134    Lipid Panel No results found for: CHOL, HDL, LDLCALC, TRIG    Wt Readings from Last 3 Encounters:  09/22/19 168 lb 8 oz (76.4 kg)  09/08/19 164 lb 12 oz (74.7 kg)  06/29/19 164 lb 12.8 oz (74.8 kg)      ASSESSMENT AND PLAN:  PAF (paroxysmal atrial fibrillation) (HCC) - Denies having tachycardia concerning for atrial fibrillation Given worsening leg swelling, off diuretics given hyponatremia Recommend she stop the diltiazem, start Coreg 25 twice daily Recommend she call our office with blood pressure measurements  Sinus node dysfunction ([place]) Tolerating pacemaker, followed by Dr.  Caryl Comes  Hyperlipidemia Cholesterol is at goal on the current lipid regimen. No changes to the medications were made.  Essential hypertension Reports blood pressure has been running high Medication changes as detailed above May need additional medications if blood pressure continues to be elevated    Total encounter time more than 25 minutes  Greater than 50% was spent in counseling and coordination of care with the patient  Disposition:   F/U  1 month    Orders Placed This Encounter  Procedures  . EKG 12-Lead    Signed, Esmond Plants, M.D., Ph.D. 09/22/2019  [place], [place]

## 2019-09-21 DIAGNOSIS — Z23 Encounter for immunization: Secondary | ICD-10-CM | POA: Diagnosis not present

## 2019-09-22 ENCOUNTER — Ambulatory Visit (INDEPENDENT_AMBULATORY_CARE_PROVIDER_SITE_OTHER): Payer: PPO | Admitting: Cardiovascular Disease

## 2019-09-22 ENCOUNTER — Encounter: Payer: Self-pay | Admitting: Cardiovascular Disease

## 2019-09-22 ENCOUNTER — Other Ambulatory Visit: Payer: Self-pay

## 2019-09-22 VITALS — BP 156/60 | HR 65 | Ht 64.0 in | Wt 168.5 lb

## 2019-09-22 DIAGNOSIS — I480 Paroxysmal atrial fibrillation: Secondary | ICD-10-CM | POA: Diagnosis not present

## 2019-09-22 DIAGNOSIS — E871 Hypo-osmolality and hyponatremia: Secondary | ICD-10-CM | POA: Diagnosis not present

## 2019-09-22 DIAGNOSIS — E782 Mixed hyperlipidemia: Secondary | ICD-10-CM | POA: Diagnosis not present

## 2019-09-22 DIAGNOSIS — Z950 Presence of cardiac pacemaker: Secondary | ICD-10-CM | POA: Diagnosis not present

## 2019-09-22 DIAGNOSIS — I495 Sick sinus syndrome: Secondary | ICD-10-CM

## 2019-09-22 MED ORDER — CARVEDILOL 25 MG PO TABS: 25.0000 mg | ORAL_TABLET | Freq: Two times a day (BID) | ORAL | 3 refills | Status: DC

## 2019-09-22 NOTE — Patient Instructions (Addendum)
Medication Instructions:  Stop the diltiazem Start coreg 25 mg twice a day  Call the office with blood pressure measurements    If you need a refill on your cardiac medications before your next appointment, please call your pharmacy.    Lab work: No new labs needed   If you have labs (blood work) drawn today and your tests are completely normal, you will receive your results only by: Marland Kitchen MyChart Message (if you have MyChart) OR . A paper copy in the mail If you have any lab test that is abnormal or we need to change your treatment, we will call you to review the results.   Testing/Procedures: No new testing needed   Follow-Up: At [facility], you and your health needs are our priority.  As part of our continuing mission to provide you with exceptional heart care, we have created designated Provider Care Teams.  These Care Teams include your primary Cardiologist (physician) and Advanced Practice Providers (APPs -  Physician Assistants and Nurse Practitioners) who all work together to provide you with the care you need, when you need it.  . You will need a follow up appointment in 1 month  . Providers on your designated Care Team:   . Murray Hodgkins, NP . Christell Faith, PA-C . Marrianne Mood, PA-C  Any Other Special Instructions Will Be Listed Below (If Applicable).  For educational health videos Log in to : [email or web address] Or : [email or web address], password : [email or web address]

## 2019-10-04 NOTE — Telephone Encounter (Signed)
If blood pressure still elevated may need additional medication Consider adding clonidine 0.1 mg twice daily  On last clinic visit she reported she was still on a salt supplement started by primary care All diuretics held for low sodium level Last sodium 135 Would moderate fluid intake May need to decrease salt tab dosing secondary to leg swelling Also need to get the weight down, this was up 9 pounds on last clinic visit Consider compression hose, leg elevation when sitting If no improvement in swelling may need to restart a diuretic 2 or 3 times per week for symptoms

## 2019-10-05 ENCOUNTER — Other Ambulatory Visit: Payer: Self-pay | Admitting: *Deleted

## 2019-10-05 DIAGNOSIS — I480 Paroxysmal atrial fibrillation: Secondary | ICD-10-CM

## 2019-10-05 MED ORDER — POTASSIUM CHLORIDE CRYS ER 20 MEQ PO TBCR: 10.0000 meq | EXTENDED_RELEASE_TABLET | ORAL | 3 refills | Status: DC

## 2019-10-05 MED ORDER — CLONIDINE HCL 0.1 MG PO TABS: 0.1000 mg | ORAL_TABLET | Freq: Two times a day (BID) | ORAL | 11 refills | Status: DC

## 2019-10-05 MED ORDER — FUROSEMIDE 20 MG PO TABS: 10.0000 mg | ORAL_TABLET | ORAL | 2 refills | Status: DC

## 2019-10-08 DIAGNOSIS — M5414 Radiculopathy, thoracic region: Secondary | ICD-10-CM | POA: Diagnosis not present

## 2019-10-08 DIAGNOSIS — M47814 Spondylosis without myelopathy or radiculopathy, thoracic region: Secondary | ICD-10-CM | POA: Diagnosis not present

## 2019-10-08 DIAGNOSIS — I2609 Other pulmonary embolism with acute cor pulmonale: Secondary | ICD-10-CM | POA: Diagnosis not present

## 2019-10-08 DIAGNOSIS — I10 Essential (primary) hypertension: Secondary | ICD-10-CM | POA: Diagnosis not present

## 2019-10-08 DIAGNOSIS — I480 Paroxysmal atrial fibrillation: Secondary | ICD-10-CM | POA: Diagnosis not present

## 2019-10-15 ENCOUNTER — Ambulatory Visit (INDEPENDENT_AMBULATORY_CARE_PROVIDER_SITE_OTHER): Payer: PPO | Admitting: *Deleted

## 2019-10-15 DIAGNOSIS — I495 Sick sinus syndrome: Secondary | ICD-10-CM | POA: Diagnosis not present

## 2019-10-15 DIAGNOSIS — I480 Paroxysmal atrial fibrillation: Secondary | ICD-10-CM

## 2019-10-16 ENCOUNTER — Telehealth: Payer: Self-pay | Admitting: Cardiovascular Disease

## 2019-10-16 NOTE — Telephone Encounter (Signed)
Patient advise request received from the patient dated 11/9.  See patient advise request for full details. She is due to see Dr. Sabra Heck (PCP) on Monday 11/16 and have a BMP done. Per Dr. Rockey Situ, she also needs a CBC for swelling to rule out anemia.  I have called Dr. Ammie Ferrier office and left a message with the scheduler to please ask Dr. Sabra Heck to add on a CBC per Dr. Rockey Situ.

## 2019-10-17 LAB — CUP PACEART REMOTE DEVICE CHECK
Battery Remaining Longevity: 76 mo
Battery Voltage: 3.01 V
Brady Statistic AP VP Percent: 0.04 %
Brady Statistic AP VS Percent: 81.02 %
Brady Statistic AS VP Percent: 0.01 %
Brady Statistic AS VS Percent: 18.93 %
Brady Statistic RA Percent Paced: 81.05 %
Brady Statistic RV Percent Paced: 0.05 %
Date Time Interrogation Session: 20201113053013
Implantable Lead Implant Date: 20160720
Implantable Lead Implant Date: 20160720
Implantable Lead Location: 753859
Implantable Lead Location: 753860
Implantable Lead Model: 5076
Implantable Lead Model: 5076
Implantable Pulse Generator Implant Date: 20160720
Lead Channel Impedance Value: 361 Ohm
Lead Channel Impedance Value: 475 Ohm
Lead Channel Impedance Value: 494 Ohm
Lead Channel Impedance Value: 532 Ohm
Lead Channel Pacing Threshold Amplitude: 0.75 V
Lead Channel Pacing Threshold Amplitude: 1.25 V
Lead Channel Pacing Threshold Pulse Width: 0.4 ms
Lead Channel Pacing Threshold Pulse Width: 0.4 ms
Lead Channel Sensing Intrinsic Amplitude: 1.5 mV
Lead Channel Sensing Intrinsic Amplitude: 1.5 mV
Lead Channel Sensing Intrinsic Amplitude: 8.625 mV
Lead Channel Sensing Intrinsic Amplitude: 8.625 mV
Lead Channel Setting Pacing Amplitude: 2.5 V
Lead Channel Setting Pacing Amplitude: 2.5 V
Lead Channel Setting Pacing Pulse Width: 0.4 ms
Lead Channel Setting Sensing Sensitivity: 1.2 mV

## 2019-10-19 DIAGNOSIS — E871 Hypo-osmolality and hyponatremia: Secondary | ICD-10-CM | POA: Diagnosis not present

## 2019-10-19 DIAGNOSIS — I2609 Other pulmonary embolism with acute cor pulmonale: Secondary | ICD-10-CM | POA: Diagnosis not present

## 2019-10-22 DIAGNOSIS — I10 Essential (primary) hypertension: Secondary | ICD-10-CM | POA: Diagnosis not present

## 2019-10-22 DIAGNOSIS — E782 Mixed hyperlipidemia: Secondary | ICD-10-CM | POA: Diagnosis not present

## 2019-10-22 DIAGNOSIS — I480 Paroxysmal atrial fibrillation: Secondary | ICD-10-CM | POA: Diagnosis not present

## 2019-10-25 NOTE — Progress Notes (Signed)
InPatient ID: Hailey Simmons, female   DOB: 1936-11-26, 83 y.o.   MRN: [id number] Cardiology Office Note  Date:  10/26/2019   ID:  Hailey Simmons, DOB 1936/10/17, MRN [id number]  PCP:  Rusty Aus, MD   Chief Complaint  Patient presents with  . other    1 month f/u c/o elevated BP. Meds reviewed verbally with pt.    HPI:  Hailey Simmons is a very pleasant 83 year old woman with history of hypothyroidism,  hyperlipidemia ,  paroxysmal atrial fibrillation January 2013 Pacer for sinus node dysfunction Echocardiogram showed normal ejection fraction ecainide fl CHA2DS2-VASc Score 4 She presents today for follow-up of her atrial fibrillation  Prior office visit Diltiazem was discontinued, changed to carvedilol  Since then continued phone calls for leg swelling Diuretics have been held by primary care for hyponatremia Dr. Sabra Heck gave her a course of indapamide 2 weeks ago and her weight dropped from ~ 177 lbs to 161 lbs- her baseline is 161- 164 lbs. Sodium dropped to 126   No swelling today Labs pending from today , ordered by Dr. Sabra Heck  Reports having insomnia on carvedilol We had recommended she decrease the dose of carvedilol down to 12.5 mg in the evening, take 25 mg in the morning Off coreg: lips swelling, throat dry,  Stopped it  Blood pressure initially elevated on arrival [id number] systolic on manual check Was 155 on her blood pressure cuff Waited 10 minutes 135/65 checked by myself manual On her BP cuff in office: 142/80  No longer takes Lasix daily  EKG personally reviewed by myself on todays visit Shows paced rhythm rate 72 bpm   Other past medical history reviewed in hospital 06/2019 Low sodium, 118 Since then has held her diuretics Primary care has kept her on her salt supplement to maintain sodium level  Active with grandchildren  Lipid Panel ,  Total cholesterol 139 LDL 63  Other past medical history  periodic urinary tract infections  She is  typically very active, takes care of 3 grandchildren several days per week.   PMH:   has a past medical history of Asthma (1970's - 1990's), Basal cell carcinoma of skin of nose, Diverticulosis, Dysrhythmia, Goiter, History of chicken pox, colonic polyps, Hypertension, Hypothyroidism, Menopause, PAF (paroxysmal atrial fibrillation) ([place]), Sinus node dysfunction ([place]), and Small bowel obstruction ([place]) (08/2009).  PSH:    Past Surgical History:  Procedure Laterality Date  . CATARACT EXTRACTION  02/2019  . CATARACT EXTRACTION  04/2019  . COLONOSCOPY    . COLONOSCOPY WITH PROPOFOL N/A 12/02/2018   Procedure: COLONOSCOPY WITH PROPOFOL;  Surgeon: Lollie Sails, MD;  Location: [facility] ENDOSCOPY;  Service: Endoscopy;  Laterality: N/A;  . EP IMPLANTABLE DEVICE N/A 06/22/2015   MDT MRI compatible dual chamber PPM implanted by Dr Caryl Comes for symptomatic bradycardia  . INSERT / REPLACE / REMOVE PACEMAKER      Current Outpatient Medications  Medication Sig Dispense Refill  . apixaban (ELIQUIS) 5 MG TABS tablet Take 1 tablet (5 mg total) by mouth 2 (two) times daily. Resume 06/26/15 60 tablet 6  . atorvastatin (LIPITOR) 10 MG tablet TAKE ONE (1) TABLET EACH DAY 90 tablet 3  . calcium carbonate (OS-CAL) 600 MG TABS Take 600 mg by mouth 2 (two) times daily with a meal.    . Cholecalciferol (VITAMIN D3) 2000 UNITS TABS Take 1 tablet by mouth daily.    . Cranberry 1000 MG CAPS Take 1 capsule by mouth daily.     Marland Kitchen  ESTRACE VAGINAL 0.1 MG/GM vaginal cream Place 1 Applicatorful vaginally as needed.     . flecainide (TAMBOCOR) 100 MG tablet TAKE ONE TABLET TWICE DAILY 180 tablet 3  . furosemide (LASIX) 20 MG tablet Take 1 tablet (20 mg total) by mouth daily as needed. 30 tablet 2  . levothyroxine (SYNTHROID, LEVOTHROID) 100 MCG tablet Take 100 mcg by mouth daily.    Marland Kitchen olmesartan (BENICAR) 40 MG tablet Take 40 mg by mouth daily.     . polyethylene glycol (MIRALAX / GLYCOLAX) 17 g packet Take 17 g by mouth daily.     . metoprolol succinate (TOPROL-XL) 25 MG 24 hr tablet Take 1 tablet (25 mg total) by mouth daily. 90 tablet 2   No current facility-administered medications for this visit.      Allergies:   Patient has no known allergies.   Social History:  The patient  reports that she has never smoked. She has never used smokeless tobacco. She reports that she does not drink alcohol or use drugs.   Family History:   family history includes Breast cancer in her maternal aunt; Heart attack in her father; Heart failure in her mother.    Review of Systems: Review of Systems  Constitutional: Negative.   HENT: Negative.   Respiratory: Negative.   Cardiovascular: Positive for leg swelling.  Gastrointestinal: Negative.   Musculoskeletal: Negative.   Neurological: Negative.   Psychiatric/Behavioral: Negative.   All other systems reviewed and are negative.   PHYSICAL EXAM: VS:  BP 135/65 (BP Location: Left Arm, Patient Position: Sitting, Cuff Size: Normal)   Pulse 72   Ht 5' (1.524 m)   Wt 161 lb (73 kg)   SpO2 98%   BMI 31.44 kg/m  , BMI Body mass index is 31.44 kg/m. Constitutional:  oriented to person, place, and time. No distress.  HENT:  Head: Grossly normal Eyes:  no discharge. No scleral icterus.  Neck: No JVD, no carotid bruits  Cardiovascular: Regular rate and rhythm, no murmurs appreciated Pulmonary/Chest: Clear to auscultation bilaterally, no wheezes or rails Abdominal: Soft.  no distension.  no tenderness.  Musculoskeletal: Normal range of motion Neurological:  normal muscle tone. Coordination normal. No atrophy Skin: Skin warm and dry Psychiatric: normal affect, pleasant   Recent Labs: 12/06/2018: ALT 29 12/07/2018: Magnesium 1.9; TSH 1.672 06/27/2019: Hemoglobin 11.2; Platelets 195 06/29/2019: BUN 8; Creatinine, Ser 0.56; Potassium 3.8; Sodium 134    Lipid Panel No results found for: CHOL, HDL, LDLCALC, TRIG    Wt Readings from Last 3 Encounters:  10/26/19 161 lb (73  kg)  09/22/19 168 lb 8 oz (76.4 kg)  09/08/19 164 lb 12 oz (74.7 kg)      ASSESSMENT AND PLAN:  PAF (paroxysmal atrial fibrillation) (HCC) - We will avoid diltiazem given previous leg swelling Did not tolerate carvedilol We will start metoprolol succinate 25 mg daily and continue flecainide  Sinus node dysfunction ([place]) Tolerating pacemaker, followed by Dr. Caryl Comes Paced rhythm today  Hyperlipidemia Cholesterol is at goal on the current lipid regimen. No changes to the medications were made.  Stable  Essential hypertension Blood pressure actually running reasonably well on today's visit 135/65 on my check Her blood pressure cuff reads 5-10 points higher systolic  Leg edema Recommended she take Lasix sparingly for 3 pound weight gain, would likely need to take with potassium and sodium pill just that day She has lab work pending from today   Total encounter time more than 25 minutes  Greater than  50% was spent in counseling and coordination of care with the patient  Disposition:   F/U 6 months    Orders Placed This Encounter  Procedures  . EKG 12-Lead    Signed, Esmond Plants, M.D., Ph.D. 10/26/2019  [place], [place]

## 2019-10-26 ENCOUNTER — Encounter: Payer: Self-pay | Admitting: Cardiovascular Disease

## 2019-10-26 ENCOUNTER — Ambulatory Visit (INDEPENDENT_AMBULATORY_CARE_PROVIDER_SITE_OTHER): Payer: PPO | Admitting: Cardiovascular Disease

## 2019-10-26 ENCOUNTER — Other Ambulatory Visit: Payer: Self-pay

## 2019-10-26 VITALS — BP 135/65 | HR 72 | Ht 60.0 in | Wt 161.0 lb

## 2019-10-26 DIAGNOSIS — M7989 Other specified soft tissue disorders: Secondary | ICD-10-CM | POA: Diagnosis not present

## 2019-10-26 DIAGNOSIS — Z950 Presence of cardiac pacemaker: Secondary | ICD-10-CM | POA: Diagnosis not present

## 2019-10-26 DIAGNOSIS — E871 Hypo-osmolality and hyponatremia: Secondary | ICD-10-CM | POA: Diagnosis not present

## 2019-10-26 DIAGNOSIS — I480 Paroxysmal atrial fibrillation: Secondary | ICD-10-CM

## 2019-10-26 DIAGNOSIS — I495 Sick sinus syndrome: Secondary | ICD-10-CM

## 2019-10-26 DIAGNOSIS — E782 Mixed hyperlipidemia: Secondary | ICD-10-CM | POA: Diagnosis not present

## 2019-10-26 MED ORDER — METOPROLOL SUCCINATE ER 25 MG PO TB24: 25.0000 mg | ORAL_TABLET | Freq: Every day | ORAL | 2 refills | Status: DC

## 2019-10-26 MED ORDER — FUROSEMIDE 20 MG PO TABS: 20.0000 mg | ORAL_TABLET | Freq: Every day | ORAL | 2 refills | Status: DC | PRN

## 2019-10-26 NOTE — Patient Instructions (Addendum)
For 3 pound weight gain take lasix with sodium pill with potassium  Start metoprolol succinate 25 mg daily   If you need a refill on your cardiac medications before your next appointment, please call your pharmacy.    Lab work: No new labs needed   If you have labs (blood work) drawn today and your tests are completely normal, you will receive your results only by: Marland Kitchen MyChart Message (if you have MyChart) OR . A paper copy in the mail If you have any lab test that is abnormal or we need to change your treatment, we will call you to review the results.   Testing/Procedures: No new testing needed   Follow-Up: At [facility], you and your health needs are our priority.  As part of our continuing mission to provide you with exceptional heart care, we have created designated Provider Care Teams.  These Care Teams include your primary Cardiologist (physician) and Advanced Practice Providers (APPs -  Physician Assistants and Nurse Practitioners) who all work together to provide you with the care you need, when you need it.  . You will need a follow up appointment in 6 months   . Providers on your designated Care Team:   . Murray Hodgkins, NP . Christell Faith, PA-C . Marrianne Mood, PA-C  Any Other Special Instructions Will Be Listed Below (If Applicable).  For educational health videos Log in to : [email or web address] Or : [email or web address], password : [email or web address]   Medication Samples have been provided to the patient.  Drug name: Eliquis       Strength: 5mg         Qty: 3 boxes  LOT: [id number]  Exp.Date: 11/2021

## 2019-11-06 NOTE — Progress Notes (Signed)
Remote pacemaker transmission.   

## 2019-11-09 DIAGNOSIS — Z961 Presence of intraocular lens: Secondary | ICD-10-CM | POA: Diagnosis not present

## 2019-12-11 ENCOUNTER — Other Ambulatory Visit: Payer: Self-pay | Admitting: Internal Medicine

## 2019-12-11 NOTE — Telephone Encounter (Signed)
Eliquis 5mg  refill request received, pt is 84yrs old, weight-73kg, Crea-0.56 on, Diagnosis-Afib, and last seen by Dr. Rockey Situ on 09/22/2019. Dose is appropriate based on dosing criteria. Will send in refill to requested pharmacy.

## 2019-12-11 NOTE — Telephone Encounter (Signed)
°*  STAT* If patient is at the pharmacy, call can be transferred to refill team.   1. Which medications need to be refilled? (please list name of each medication and dose if known) Eliquis 5 mg po BID   2. Which pharmacy/location (including street and city if local pharmacy) is medication to be sent to? [facility]   3. Do they need a 30 day or 90 day supply? [place]

## 2019-12-31 DIAGNOSIS — D692 Other nonthrombocytopenic purpura: Secondary | ICD-10-CM | POA: Diagnosis not present

## 2019-12-31 DIAGNOSIS — L720 Epidermal cyst: Secondary | ICD-10-CM | POA: Diagnosis not present

## 2019-12-31 DIAGNOSIS — Z85828 Personal history of other malignant neoplasm of skin: Secondary | ICD-10-CM | POA: Diagnosis not present

## 2019-12-31 DIAGNOSIS — L821 Other seborrheic keratosis: Secondary | ICD-10-CM | POA: Diagnosis not present

## 2019-12-31 DIAGNOSIS — L900 Lichen sclerosus et atrophicus: Secondary | ICD-10-CM | POA: Diagnosis not present

## 2019-12-31 DIAGNOSIS — T07XXXA Unspecified multiple injuries, initial encounter: Secondary | ICD-10-CM | POA: Diagnosis not present

## 2020-01-14 ENCOUNTER — Ambulatory Visit (INDEPENDENT_AMBULATORY_CARE_PROVIDER_SITE_OTHER): Payer: PPO | Admitting: *Deleted

## 2020-01-14 DIAGNOSIS — I495 Sick sinus syndrome: Secondary | ICD-10-CM

## 2020-01-14 DIAGNOSIS — I10 Essential (primary) hypertension: Secondary | ICD-10-CM | POA: Diagnosis not present

## 2020-01-14 DIAGNOSIS — E782 Mixed hyperlipidemia: Secondary | ICD-10-CM | POA: Diagnosis not present

## 2020-01-14 DIAGNOSIS — I480 Paroxysmal atrial fibrillation: Secondary | ICD-10-CM | POA: Diagnosis not present

## 2020-01-14 LAB — CUP PACEART REMOTE DEVICE CHECK
Battery Remaining Longevity: 70 mo
Battery Voltage: 3 V
Brady Statistic AP VP Percent: 0.05 %
Brady Statistic AP VS Percent: 95.21 %
Brady Statistic AS VP Percent: 0 %
Brady Statistic AS VS Percent: 4.73 %
Brady Statistic RA Percent Paced: 95.26 %
Brady Statistic RV Percent Paced: 0.05 %
Date Time Interrogation Session: 20210211084346
Implantable Lead Implant Date: 20160720
Implantable Lead Implant Date: 20160720
Implantable Lead Location: 753859
Implantable Lead Location: 753860
Implantable Lead Model: 5076
Implantable Lead Model: 5076
Implantable Pulse Generator Implant Date: 20160720
Lead Channel Impedance Value: 361 Ohm
Lead Channel Impedance Value: 475 Ohm
Lead Channel Impedance Value: 494 Ohm
Lead Channel Impedance Value: 532 Ohm
Lead Channel Pacing Threshold Amplitude: 0.75 V
Lead Channel Pacing Threshold Amplitude: 1 V
Lead Channel Pacing Threshold Pulse Width: 0.4 ms
Lead Channel Pacing Threshold Pulse Width: 0.4 ms
Lead Channel Sensing Intrinsic Amplitude: 1 mV
Lead Channel Sensing Intrinsic Amplitude: 1 mV
Lead Channel Sensing Intrinsic Amplitude: 9.125 mV
Lead Channel Sensing Intrinsic Amplitude: 9.125 mV
Lead Channel Setting Pacing Amplitude: 2.5 V
Lead Channel Setting Pacing Amplitude: 2.5 V
Lead Channel Setting Pacing Pulse Width: 0.4 ms
Lead Channel Setting Sensing Sensitivity: 1.2 mV

## 2020-01-14 NOTE — Progress Notes (Signed)
PPM Remote  

## 2020-01-20 DIAGNOSIS — E782 Mixed hyperlipidemia: Secondary | ICD-10-CM | POA: Diagnosis not present

## 2020-01-27 DIAGNOSIS — I480 Paroxysmal atrial fibrillation: Secondary | ICD-10-CM | POA: Diagnosis not present

## 2020-01-27 DIAGNOSIS — Z0000 Encounter for general adult medical examination without abnormal findings: Secondary | ICD-10-CM | POA: Diagnosis not present

## 2020-01-27 DIAGNOSIS — E782 Mixed hyperlipidemia: Secondary | ICD-10-CM | POA: Diagnosis not present

## 2020-02-10 DIAGNOSIS — I10 Essential (primary) hypertension: Secondary | ICD-10-CM | POA: Diagnosis not present

## 2020-02-10 DIAGNOSIS — I480 Paroxysmal atrial fibrillation: Secondary | ICD-10-CM | POA: Diagnosis not present

## 2020-03-07 DIAGNOSIS — H02403 Unspecified ptosis of bilateral eyelids: Secondary | ICD-10-CM | POA: Diagnosis not present

## 2020-03-08 ENCOUNTER — Ambulatory Visit (INDEPENDENT_AMBULATORY_CARE_PROVIDER_SITE_OTHER): Payer: PPO | Admitting: Internal Medicine

## 2020-03-08 ENCOUNTER — Encounter: Payer: Self-pay | Admitting: Internal Medicine

## 2020-03-08 ENCOUNTER — Other Ambulatory Visit: Payer: Self-pay

## 2020-03-08 VITALS — BP 146/74 | HR 72 | Ht 61.0 in | Wt 165.0 lb

## 2020-03-08 DIAGNOSIS — I495 Sick sinus syndrome: Secondary | ICD-10-CM | POA: Diagnosis not present

## 2020-03-08 DIAGNOSIS — I480 Paroxysmal atrial fibrillation: Secondary | ICD-10-CM

## 2020-03-08 DIAGNOSIS — E871 Hypo-osmolality and hyponatremia: Secondary | ICD-10-CM | POA: Diagnosis not present

## 2020-03-08 DIAGNOSIS — I10 Essential (primary) hypertension: Secondary | ICD-10-CM | POA: Diagnosis not present

## 2020-03-08 DIAGNOSIS — Z950 Presence of cardiac pacemaker: Secondary | ICD-10-CM | POA: Diagnosis not present

## 2020-03-08 NOTE — Patient Instructions (Addendum)
Medication Instructions:  - Your physician has recommended you make the following change in your medication:   1) when you feel comfortable try stopping your metoprolol x 2 weeks to see how your blood tolerates being off of this, it may be that we can stop this afterwards if your blood pressure is ok  Samples Given:  Eliquis 5 mg Lot: [id number] Exp: 01/2022  *If you need a refill on your cardiac medications before your next appointment, please call your pharmacy*   Lab Work: - none ordered  If you have labs (blood work) drawn today and your tests are completely normal, you will receive your results only by: Marland Kitchen MyChart Message (if you have MyChart) OR . A paper copy in the mail If you have any lab test that is abnormal or we need to change your treatment, we will call you to review the results.   Testing/Procedures: - none ordered   Follow-Up: At [facility], you and your health needs are our priority.  As part of our continuing mission to provide you with exceptional heart care, we have created designated Provider Care Teams.  These Care Teams include your primary Cardiologist (physician) and Advanced Practice Providers (APPs -  Physician Assistants and Nurse Practitioners) who all work together to provide you with the care you need, when you need it.  We recommend signing up for the patient portal called "MyChart".  Sign up information is provided on this After Visit Summary.  MyChart is used to connect with patients for Virtual Visits (Telemedicine).  Patients are able to view lab/test results, encounter notes, upcoming appointments, etc.  Non-urgent messages can be sent to your provider as well.   To learn more about what you can do with MyChart, go to [email or web address].    Your next appointment:   6 month(s)  The format for your next appointment:   In Person  Provider:   Virl Axe, MD   Other Instructions We will let you know about the name of 2 back doctors in  [facility]

## 2020-03-08 NOTE — Progress Notes (Signed)
Patient Care Team: Rusty Aus, MD as PCP - General (Unknown Physician Specialty) [name], MD as Consulting Physician (Cardiology)   HPI  Hailey Simmons is a 84 y.o. female Seen in followup for  Medtronic pacer implanted (2016)  for sinus node dysfunction and PAF  Hospitalized 7/20 with severe hyponatremia (118) diuretics were discontinued.  She has been followed by renal.   She has had some edema.   Takes a diuretic prn  Most limitation is related to her back which has been progressively problematic  No sob chest pain nocturnal dyspnea  No palps or bleeding       DATE TEST EF   2/13    Echo  60 %   8/17    Myoview     55-60 % No Ischemia        Date Cr NA Hgb  2/18 0.9  12.6  8/18  0.75  12.5  2/19 0.8  12.8  7/20 .57 118 11.2>>11.9  10/20  137   2/21 0.7 137 13.5     DATE PR duration QRSduration Dose-flecianide  3/13 160 134 0  7/15  158 50  10/16 180 154 100  6/17`  146 100  7/18 (A paced) 280 140 100  8/19 Apaced 240 142 100   9/20 Apaced 236 154 100  4/21 Apaced 260 150 [id number]   Thromboembolic risk factors ( age -23, HTN-1, Gender-1) for a CHADSVASc Score of 4   Past Medical History:  Diagnosis Date  . Asthma 1970's - 1990's   a. ? 2/2 wood stove usage.  . Basal cell carcinoma of skin of nose   . Diverticulosis   . Dysrhythmia    A-FIB  . Goiter   . History of chicken pox   . Hx of colonic polyps   . Hypertension   . Hypothyroidism   . Menopause   . PAF (paroxysmal atrial fibrillation) ([place])    on apixoban  . Sinus node dysfunction (HCC)    a. s/p MDT dual chamber pacemaker  . Small bowel obstruction ([place]) 08/2009    Past Surgical History:  Procedure Laterality Date  . CATARACT EXTRACTION  02/2019  . CATARACT EXTRACTION  04/2019  . COLONOSCOPY    . COLONOSCOPY WITH PROPOFOL N/A 12/02/2018   Procedure: COLONOSCOPY WITH PROPOFOL;  Surgeon: Lollie Sails, MD;  Location: [facility] ENDOSCOPY;  Service: Endoscopy;   Laterality: N/A;  . EP IMPLANTABLE DEVICE N/A 06/22/2015   MDT MRI compatible dual chamber PPM implanted by Dr Caryl Comes for symptomatic bradycardia  . INSERT / REPLACE / REMOVE PACEMAKER      Current Outpatient Medications  Medication Sig Dispense Refill  . atorvastatin (LIPITOR) 10 MG tablet TAKE ONE (1) TABLET EACH DAY 90 tablet 3  . calcium carbonate (OS-CAL) 600 MG TABS Take 600 mg by mouth 2 (two) times daily with a meal.    . Cholecalciferol (VITAMIN D3) 2000 UNITS TABS Take 1 tablet by mouth daily.    . Cranberry 1000 MG CAPS Take 1 capsule by mouth daily.     Marland Kitchen diltiazem (CARDIZEM CD) 240 MG 24 hr capsule Take 240 mg by mouth at bedtime.    Marland Kitchen ELIQUIS 5 MG TABS tablet TAKE ONE TABLET BY MOUTH TWICE DAILY 180 tablet 1  . ESTRACE VAGINAL 0.1 MG/GM vaginal cream Place 1 Applicatorful vaginally as needed.     . flecainide (TAMBOCOR) 100 MG tablet TAKE ONE TABLET TWICE DAILY 180 tablet 3  .  furosemide (LASIX) 20 MG tablet Take 1 tablet (20 mg total) by mouth daily as needed. 30 tablet 2  . hydrALAZINE (APRESOLINE) 25 MG tablet Take 25 mg by mouth 2 (two) times daily.    Marland Kitchen levothyroxine (SYNTHROID, LEVOTHROID) 100 MCG tablet Take 100 mcg by mouth daily.    . metoprolol succinate (TOPROL-XL) 25 MG 24 hr tablet Take 1 tablet (25 mg total) by mouth daily. 90 tablet 2  . olmesartan (BENICAR) 40 MG tablet Take 40 mg by mouth daily.     . polyethylene glycol (MIRALAX / GLYCOLAX) 17 g packet Take 17 g by mouth daily.    . sodium chloride 1 g tablet Take by mouth.     No current facility-administered medications for this visit.    No Known Allergies    Review of Systems negative except from HPI and PMH  Physical Exam  BP (!) 146/74 (BP Location: Left Arm, Patient Position: Sitting, Cuff Size: Normal)   Pulse 72   Ht 5\' 1"  (1.549 m)   Wt 165 lb (74.8 kg)   SpO2 98%   BMI 31.18 kg/m  Well developed and well nourished in no acute distress HENT normal Neck supple with  JVP-flat Clear Device pocket well healed; without hematoma or erythema.  There is no tethering  Regular rate and rhythm, no   murmur Abd-soft with active BS No Clubbing cyanosis tr edema Skin-warm and dry A & Oriented  Grossly normal sensory and motor function  ECG atrial pacing at 84 year old   28/[birth year]    Assessment and  Plan  Atrial fibrillation-paroxysmal  Sinus node dysfunction  Pacemaker-Medtronic  The patient's device was interrogated.  The information was reviewed. No changes were made in the programming.    Hyponatremia  Right bundle branch block  Hypertension     There is progressive prolongation of the PR interval and QRS duration.  We will plan to review this again in about 6 months and will make a determination at that time regarding down titration of her flecainide.  Blood pressures well controlled on her complex regime; I suggested that she try discontinuing her metoprolol and seeing if her blood pressure remains under control  Interval atrial fibrillation is less than minutes at a time  No bleeding.

## 2020-03-25 ENCOUNTER — Telehealth: Payer: Self-pay | Admitting: Cardiovascular Disease

## 2020-03-25 NOTE — Telephone Encounter (Signed)
1. What dental office are you calling from? [facility] , Dr. Eartha Inch  2. What is your office phone number? [phone]  3. What is your fax number? [phone]  4. What type of procedure is the patient having performed? crown  5. What date is procedure scheduled or is the patient there now? 03/28/20 (if the patient is at the dentist's office question goes to their cardiologist if he/she is in the office.  If not, question should go to the DOD).   6. What is your question (ex. Antibiotics prior to procedure, holding medication-we need to know how long dentist wants pt to hold med)?  Does patient need to hold Eliquis?  Phones stop ringing at 3 pm

## 2020-03-25 NOTE — Telephone Encounter (Signed)
Error

## 2020-03-29 ENCOUNTER — Other Ambulatory Visit: Payer: Self-pay | Admitting: Neurosurgery

## 2020-03-29 DIAGNOSIS — M5440 Lumbago with sciatica, unspecified side: Secondary | ICD-10-CM | POA: Diagnosis not present

## 2020-03-29 DIAGNOSIS — M542 Cervicalgia: Secondary | ICD-10-CM

## 2020-03-29 NOTE — Telephone Encounter (Signed)
Recommend continuing Eliquis for crown placement.

## 2020-04-08 ENCOUNTER — Other Ambulatory Visit: Payer: Self-pay

## 2020-04-08 ENCOUNTER — Ambulatory Visit
Admission: RE | Admit: 2020-04-08 | Discharge: 2020-04-08 | Disposition: A | Discharge status: Home / Self Care | Payer: PPO | Source: Ambulatory Visit | Attending: Neurosurgery | Admitting: Neurosurgery

## 2020-04-08 DIAGNOSIS — M5440 Lumbago with sciatica, unspecified side: Secondary | ICD-10-CM | POA: Diagnosis not present

## 2020-04-08 DIAGNOSIS — M48061 Spinal stenosis, lumbar region without neurogenic claudication: Secondary | ICD-10-CM | POA: Diagnosis not present

## 2020-04-08 DIAGNOSIS — M542 Cervicalgia: Secondary | ICD-10-CM | POA: Diagnosis not present

## 2020-04-14 ENCOUNTER — Ambulatory Visit (INDEPENDENT_AMBULATORY_CARE_PROVIDER_SITE_OTHER): Payer: PPO | Admitting: *Deleted

## 2020-04-14 DIAGNOSIS — I495 Sick sinus syndrome: Secondary | ICD-10-CM

## 2020-04-14 LAB — CUP PACEART REMOTE DEVICE CHECK
Battery Remaining Longevity: 73 mo
Battery Voltage: 3 V
Brady Statistic AP VP Percent: 0.11 %
Brady Statistic AP VS Percent: 89.24 %
Brady Statistic AS VP Percent: 0.01 %
Brady Statistic AS VS Percent: 10.65 %
Brady Statistic RA Percent Paced: 89.34 %
Brady Statistic RV Percent Paced: 0.11 %
Date Time Interrogation Session: 20210513170109
Implantable Lead Implant Date: 20160720
Implantable Lead Implant Date: 20160720
Implantable Lead Location: 753859
Implantable Lead Location: 753860
Implantable Lead Model: 5076
Implantable Lead Model: 5076
Implantable Pulse Generator Implant Date: 20160720
Lead Channel Impedance Value: 323 Ohm
Lead Channel Impedance Value: 437 Ohm
Lead Channel Impedance Value: 456 Ohm
Lead Channel Impedance Value: 494 Ohm
Lead Channel Pacing Threshold Amplitude: 0.875 V
Lead Channel Pacing Threshold Amplitude: 0.875 V
Lead Channel Pacing Threshold Pulse Width: 0.4 ms
Lead Channel Pacing Threshold Pulse Width: 0.4 ms
Lead Channel Sensing Intrinsic Amplitude: 1.125 mV
Lead Channel Sensing Intrinsic Amplitude: 1.125 mV
Lead Channel Sensing Intrinsic Amplitude: 8 mV
Lead Channel Sensing Intrinsic Amplitude: 8 mV
Lead Channel Setting Pacing Amplitude: 1.75 V
Lead Channel Setting Pacing Amplitude: 2.5 V
Lead Channel Setting Pacing Pulse Width: 0.4 ms
Lead Channel Setting Sensing Sensitivity: 1.2 mV

## 2020-04-18 NOTE — Progress Notes (Signed)
Remote pacemaker transmission.   

## 2020-04-19 ENCOUNTER — Ambulatory Visit (INDEPENDENT_AMBULATORY_CARE_PROVIDER_SITE_OTHER): Payer: PPO | Admitting: Cardiovascular Disease

## 2020-04-19 ENCOUNTER — Encounter: Payer: Self-pay | Admitting: Cardiovascular Disease

## 2020-04-19 ENCOUNTER — Other Ambulatory Visit: Payer: Self-pay

## 2020-04-19 VITALS — BP 138/60 | HR 67 | Ht 61.0 in | Wt 165.0 lb

## 2020-04-19 DIAGNOSIS — I480 Paroxysmal atrial fibrillation: Secondary | ICD-10-CM

## 2020-04-19 NOTE — Progress Notes (Signed)
InPatient ID: Hailey Simmons, female   DOB: Apr 27, 1936, 84 y.o.   MRN: [id number] Cardiology Office Note  Date:  04/19/2020   ID:  Hailey Simmons, DOB 04/28/1936, MRN [id number]  PCP:  Rusty Aus, MD   Chief Complaint  Patient presents with  . Other    6 month follow up. C/o some swelling in ankles.  Meds reviewed verbally with patient.     HPI:  Hailey Simmons is a very pleasant 84 year old woman with history of hypothyroidism,  hyperlipidemia ,  paroxysmal atrial fibrillation January 2013 Pacer for sinus node dysfunction Echocardiogram showed normal ejection fraction flecainide  CHA2DS2-VASc Score 4 She presents today for follow-up of her atrial fibrillation  Prior visit, off diltiazem Started back on diltiazem 01/2020 by PMD  Sodium in Feb 2021: 137 Other labs Total chol 154, LDL 70  Takes lasix sparingly, every three  weeks Has not been taking salt tab  Eats out 2x a week  Active with grandchildren  Sedentary, back problem Had CT scan  Prior office visit Diltiazem was discontinued, changed to carvedilol for leg swelling  Since then continued phone calls for leg swelling Diuretics have been held by primary care for hyponatremia Dr. Sabra Heck gave her a course of indapamide 2 weeks ago and her weight dropped from ~ 177 lbs to 161 lbs- her baseline is 161- 164 lbs. Sodium dropped to 126   Reports having insomnia on carvedilol Stopped it  Other past medical history reviewed in hospital 06/2019 Low sodium, 118 Since then has held her diuretics Primary care has kept her on her salt supplement to maintain sodium level  periodic urinary tract infections  She is typically very active, takes care of 3 grandchildren several days per week.   PMH:   has a past medical history of Asthma (1970's - 1990's), Basal cell carcinoma of skin of nose, Diverticulosis, Dysrhythmia, Goiter, History of chicken pox, colonic polyps, Hypertension, Hypothyroidism, Menopause, PAF  (paroxysmal atrial fibrillation) ([place]), Sinus node dysfunction ([place]), and Small bowel obstruction ([place]) (08/2009).  PSH:    Past Surgical History:  Procedure Laterality Date  . CATARACT EXTRACTION  02/2019  . CATARACT EXTRACTION  04/2019  . COLONOSCOPY    . COLONOSCOPY WITH PROPOFOL N/A 12/02/2018   Procedure: COLONOSCOPY WITH PROPOFOL;  Surgeon: Lollie Sails, MD;  Location: [facility] ENDOSCOPY;  Service: Endoscopy;  Laterality: N/A;  . EP IMPLANTABLE DEVICE N/A 06/22/2015   MDT MRI compatible dual chamber PPM implanted by Dr Caryl Comes for symptomatic bradycardia  . INSERT / REPLACE / REMOVE PACEMAKER      Current Outpatient Medications  Medication Sig Dispense Refill  . atorvastatin (LIPITOR) 10 MG tablet TAKE ONE (1) TABLET EACH DAY 90 tablet 3  . calcium carbonate (OS-CAL) 600 MG TABS Take 600 mg by mouth 2 (two) times daily with a meal.    . Cholecalciferol (VITAMIN D3) 2000 UNITS TABS Take 1 tablet by mouth daily.    . Cranberry 1000 MG CAPS Take 1 capsule by mouth daily.     Marland Kitchen diltiazem (CARDIZEM CD) 240 MG 24 hr capsule Take 240 mg by mouth at bedtime.    Marland Kitchen ELIQUIS 5 MG TABS tablet TAKE ONE TABLET BY MOUTH TWICE DAILY 180 tablet 1  . ESTRACE VAGINAL 0.1 MG/GM vaginal cream Place 1 Applicatorful vaginally as needed.     . flecainide (TAMBOCOR) 100 MG tablet TAKE ONE TABLET TWICE DAILY 180 tablet 3  . furosemide (LASIX) 20 MG tablet Take 1 tablet (20 mg  total) by mouth daily as needed. 30 tablet 2  . hydrALAZINE (APRESOLINE) 25 MG tablet Take 25 mg by mouth 2 (two) times daily.    Marland Kitchen levothyroxine (SYNTHROID, LEVOTHROID) 100 MCG tablet Take 100 mcg by mouth daily.    . metoprolol succinate (TOPROL-XL) 25 MG 24 hr tablet Take 1 tablet (25 mg total) by mouth daily. 90 tablet 2  . olmesartan (BENICAR) 40 MG tablet Take 40 mg by mouth daily.     . polyethylene glycol (MIRALAX / GLYCOLAX) 17 g packet Take 17 g by mouth daily.    . sodium chloride 1 g tablet Take by mouth.     No current  facility-administered medications for this visit.     Allergies:   Patient has no known allergies.   Social History:  The patient  reports that she has never smoked. She has never used smokeless tobacco. She reports that she does not drink alcohol or use drugs.   Family History:   family history includes Breast cancer in her maternal aunt; Heart attack in her father; Heart failure in her mother.    Review of Systems: Review of Systems  Constitutional: Negative.   HENT: Negative.   Respiratory: Negative.   Cardiovascular: Negative.   Gastrointestinal: Negative.   Musculoskeletal: Negative.   Neurological: Negative.   Psychiatric/Behavioral: Negative.   All other systems reviewed and are negative.   PHYSICAL EXAM: VS:  BP 138/60 (BP Location: Left Arm, Patient Position: Sitting, Cuff Size: Normal)   Pulse 67   Ht 5\' 1"  (1.549 m)   Wt 165 lb (74.8 kg)   SpO2 98%   BMI 31.18 kg/m  , BMI Body mass index is 31.18 kg/m. Constitutional:  oriented to person, place, and time. No distress.  HENT:  Head: Grossly normal Eyes:  no discharge. No scleral icterus.  Neck: No JVD, no carotid bruits  Cardiovascular: Regular rate and rhythm, no murmurs appreciated Pulmonary/Chest: Clear to auscultation bilaterally, no wheezes or rails Abdominal: Soft.  no distension.  no tenderness.  Musculoskeletal: Normal range of motion Neurological:  normal muscle tone. Coordination normal. No atrophy Skin: Skin warm and dry Psychiatric: normal affect, pleasant  Recent Labs: 06/27/2019: Hemoglobin 11.2; Platelets 195 06/29/2019: BUN 8; Creatinine, Ser 0.56; Potassium 3.8; Sodium 134    Lipid Panel No results found for: CHOL, HDL, LDLCALC, TRIG    Wt Readings from Last 3 Encounters:  04/19/20 165 lb (74.8 kg)  03/08/20 165 lb (74.8 kg)  10/26/19 161 lb (73 kg)      ASSESSMENT AND PLAN:  PAF (paroxysmal atrial fibrillation) (HCC) - On diltiazem and metoprolol  continue flecainide On  eliquis NSR  Sinus node dysfunction ([place]) Tolerating pacemaker, followed by Dr. Caryl Comes Paced rhythm today No change compared to 03/2020  Hyperlipidemia Cholesterol is at goal on the current lipid regimen. No changes to the medications were made.  Essential hypertension Blood pressure is well controlled on today's visit. No changes made to the medications.  Leg edema Minimal even on diltiazem, Does not eat out, low fluid intake  Hyponatremia: Stable on less lasix Take salt pill with lasix PRN   Total encounter time more than 25 minutes  Greater than 50% was spent in counseling and coordination of care with the patient  Disposition:   F/U 12 months    Orders Placed This Encounter  Procedures  . EKG 12-Lead    Signed, Esmond Plants, M.D., Ph.D. 04/19/2020  [place], [place]

## 2020-04-19 NOTE — Patient Instructions (Signed)

## 2020-04-21 DIAGNOSIS — M5440 Lumbago with sciatica, unspecified side: Secondary | ICD-10-CM | POA: Diagnosis not present

## 2020-04-21 DIAGNOSIS — M542 Cervicalgia: Secondary | ICD-10-CM | POA: Diagnosis not present

## 2020-05-02 ENCOUNTER — Other Ambulatory Visit: Payer: Self-pay | Admitting: Cardiovascular Disease

## 2020-05-31 DIAGNOSIS — H02403 Unspecified ptosis of bilateral eyelids: Secondary | ICD-10-CM | POA: Diagnosis not present

## 2020-05-31 DIAGNOSIS — H02839 Dermatochalasis of unspecified eye, unspecified eyelid: Secondary | ICD-10-CM | POA: Diagnosis not present

## 2020-05-31 DIAGNOSIS — H5340 Unspecified visual field defects: Secondary | ICD-10-CM | POA: Diagnosis not present

## 2020-06-15 ENCOUNTER — Ambulatory Visit: Payer: PPO | Attending: Neurosurgery

## 2020-06-15 ENCOUNTER — Other Ambulatory Visit: Payer: Self-pay

## 2020-06-15 DIAGNOSIS — G8929 Other chronic pain: Secondary | ICD-10-CM | POA: Diagnosis not present

## 2020-06-15 DIAGNOSIS — M545 Low back pain, unspecified: Secondary | ICD-10-CM

## 2020-06-15 DIAGNOSIS — M542 Cervicalgia: Secondary | ICD-10-CM | POA: Insufficient documentation

## 2020-06-15 DIAGNOSIS — R262 Difficulty in walking, not elsewhere classified: Secondary | ICD-10-CM

## 2020-06-15 DIAGNOSIS — M6281 Muscle weakness (generalized): Secondary | ICD-10-CM | POA: Diagnosis not present

## 2020-06-15 NOTE — Therapy (Signed)
[place] [address] [place], [address] Phone: [phone]   Fax:  [phone]  Physical Therapy Evaluation  Patient Details  Name: Hailey Simmons MRN: [id number]  Date of Birth: 1935-12-21 Referring Provider (PT): Kary Kos, MD   Encounter Date: 06/15/2020   PT End of Session - 06/15/20 1303    Visit Number 1    Number of Visits 17    Date for PT Re-Evaluation 08/11/20    Authorization Type 1    Authorization Time Period of 10 progress report    PT Start Time 1304    PT Stop Time 1402    PT Time Calculation (min) 58 min    Activity Tolerance Patient tolerated treatment well    Behavior During Therapy [facility] for tasks assessed/performed           Past Medical History:  Diagnosis Date   Asthma 1970's - 1990's   a. ? 2/2 wood stove usage.   Basal cell carcinoma of skin of nose    Diverticulosis    Dysrhythmia    A-FIB   Goiter    History of chicken pox    Hx of colonic polyps    Hypertension    Hypothyroidism    Menopause    PAF (paroxysmal atrial fibrillation) ([place])    on apixoban   Sinus node dysfunction ([place])    a. s/p MDT dual chamber pacemaker   Small bowel obstruction ([place]) 08/2009    Past Surgical History:  Procedure Laterality Date   CATARACT EXTRACTION  02/2019   CATARACT EXTRACTION  04/2019   COLONOSCOPY     COLONOSCOPY WITH PROPOFOL N/A 12/02/2018   Procedure: COLONOSCOPY WITH PROPOFOL;  Surgeon: Lollie Sails, MD;  Location: [facility] END[facility]COPY;  Service: Endoscopy;  Laterality: N/A;   EP IMPLANTABLE DEVICE N/A 06/22/2015   MDT MRI compatible dual chamber PPM implanted by Dr Caryl Comes for symptomatic bradycardia   INSERT / REPLACE / REMOVE PACEMAKER      There were no vitals filed for this visit.    Subjective Assessment - 06/15/20 1307    Subjective Neck: 3/10 currently (B upper trap/C5 dermatome area), 8/10 at worst on average but has gotten to a 10/10  for the past 3 months.  Low back: 0/10 currently (pt sitting), 5/10 at worst for the past 3 months. No B LE pain/no radiating symptoms.    Pertinent History Cervicalgia, lumbago with sciatica. Neck is currently bothering her the most. Neck pain began about 3 years ago. Sitting and doing nothing, pt is fine. However when pt performs chores such as cooking and cleaning or yardwork, or walking a while. Gradual onset of pain. Heating pad used to help. Stopped using heating pad because she uses aspercreme and she's not supposed to put a heating pad on it. Also has a pacemaker since 5 years ago.  Had x-ray for back and was told that her spine was crooked. Pt was also told that she might have had a fracture in her low back, also has arthritis, degenerative discs, spurring in her spine.  Low back pain has been bothering her on and off for the past 5-6 years. Denies loss of bowel or bladder control or LE paresthesia.  Sudden onset of back pain, unknown method of injury.  No falls within the past 6 months. No fear of falling.    Patient Stated Goals Walk, perform chores, cook more comfortably.    Currently in Pain? Yes  Pain Score 2     Pain Location Neck    Pain Orientation Right;Left;Posterior;Lateral    Pain Type Chronic pain    Pain Radiating Towards C5 dermatome/B upper trap area    Pain Onset More than a month ago    Pain Frequency Occasional    Aggravating Factors  Neck: chores, cooking, cleaning, walking (about 40 minutes); back: walking, supine postition, choes, washing dishes, cooking.    Pain Relieving Factors Neck: aspercreme, heat, sitting and staying still, R S/L; Back: sitting              [facility] PT Assessment - 06/15/20 1322      Assessment   Medical Diagnosis Lumbago, cervicalgia    Referring Provider (PT) Kary Kos, MD    Onset Date/Surgical Date 04/21/20   Date PT referral signed. Chronic condition   Hand Dominance Right    Prior Therapy Had chiropractic treatment for neck which  did not make any lasting effect (had massage in her upper traps as well)      Precautions   Precaution Comments PACEMAKER      Restrictions   Other Position/Activity Restrictions PACEMAKER related      Balance Screen   Has the patient fallen in the past 6 months No    Has the patient had a decrease in activity level because of a fear of falling?  No    Is the patient reluctant to leave their home because of a fear of falling?  No      Prior Function   Vocation Requirements PLOF: less difficulty performing chores, cooking, cleaning, walking      Observation/Other Assessments   Focus on Therapeutic Outcomes (FOTO)  Lumbar Spine FOTO: 49      Posture/Postural Control   Posture Comments Protracted neck, R shoulder higher, slight R lateral shift of neck, R lateral lean and slight L trunk rotation, R knee lower, slight B foot pronation      AROM   Cervical Flexion full, with neck pulling    Cervical Extension Limited with reproduction of symptoms   Movement at C5/C6 area   Cervical - Right Side Bend limited with L lateral neck pulling    Cervical - Left Side Bend limited with R lateral neck pain    Cervical - Right Rotation limited with L lateral neck pain     Cervical - Left Rotation limited with R lateral neck pain    Lumbar Flexion WFL with R low back symptoms    Lumbar Extension limited with R low back pain (smaller suface area of pain compared to lumbar flexion)    Lumbar - Right Side Bend limited    Lumbar - Left Side Bend limited with increased R low back pain    Lumbar - Right Rotation WFL    Lumbar - Left Rotation WFL with R low back pulling      Strength   Overall Strength Comments lower trap: R 4-/5, L 3+/5   seated, manually resisted   Right Shoulder Flexion 4-/5    Right Shoulder ABduction 4/5    Left Shoulder Flexion 4-/5    Left Shoulder ABduction 4/5    Right Elbow Flexion 4/5    Right Elbow Extension 4/5    Left Elbow Flexion 4/5    Left Elbow Extension 4/5     Right Wrist Extension 4+/5    Left Wrist Extension 4/5    Right Hip Flexion 4-/5    Right Hip Extension 4/5  seated, manually resisted   Right Hip ABduction 3/5    Left Hip Flexion 4-/5    Left Hip Extension 4-/5   seated, manually resisted   Left Hip ABduction 3/5    Right Knee Flexion 4/5    Right Knee Extension 5/5    Left Knee Flexion 4/5    Left Knee Extension 4+/5      Palpation   Palpation comment B upper trap muscle tension      Ambulation/Gait   Gait Comments Decreased stance L LE with L lateral lean. Decreased bilateral hip and knee extension during stance phase of gait.                       Objective measurements completed on examination: See above findings.   Medbridge Access Code [email or web address]   Blood pressure is controlled per pt No latex band allergies  Cervical symptoms along C5 dermatome proximally    PACEMAKER  Observation: pt tendency to protract neck.  Movement preference for low back: R side bend    Therapeutic exercise  Seated B scapular retraction 10x5 seconds    Chin tucks 10x5 seconds  Reviewed and given aforementioned exercises at part of her HEP. Pt demonstrated and verbalized understanding. Handout provided.   Pt was also recommended to perform R trunk side bending to help with her back pain due to directional preference. Pt verbalized understanding..   Improved exercise technique, movement at target joints, use of target muscles after mod verbal, visual, tactile cues.    Response to treatment Pt tolerated session well without aggravation of symptoms.   Clinical impression Patient is an 84 year old female who came to physical therapy secondary to neck and low back pain. She also presents with altered gait pattern and posture, movement preference to around C5/C6 area at her cervical spine, bilateral scapular weakness, limited cervical and lumbar AROM, bilateral hip weakness, movement preference for R lumbar side bending as  well as extension for low back, and difficulty performing functional tasks such as looking around, walking, and performing chores secondary to pain. Pt will benefit from skilled physical therapy services to address the aforementioned deficits.       PT Education - 06/15/20 1908    Education Details ther-ex, HEP, plan of care    Person(s) Educated Patient    Methods Explanation;Demonstration;Tactile cues;Verbal cues;Handout    Comprehension Returned demonstration;Verbalized understanding            PT Short Term Goals - 06/15/20 1914      PT SHORT TERM GOAL #1   Title Patient will be independent with her HEP to decrease pain, improve cervical and lumbar AROM, improve strength and function.    Baseline Pt has started her HEP. (06/15/2020)    Time 3    Period Weeks    Status New    Target Date 07/07/20             PT Long Term Goals - 06/15/20 1915      PT LONG TERM GOAL #1   Title Patient will have a decrease in neck pain to 5/10 or less at worst to promote ability to perform chores as well as ambulate with less difficulty.    Baseline 10/10 at worst for the past 3 months (06/15/2020)    Time 8    Period Weeks    Status New    Target Date 08/11/20      PT LONG TERM GOAL #2  Title Patient will have a decrease in low back pain to 2/10 or less at most to promote ability to peform chores, as well as ambulate more comfortably.    Baseline 5/10 low back pain at most for the past 3 months (06/15/2020)    Time 8    Period Weeks    Status New    Target Date 08/11/20      PT LONG TERM GOAL #3   Title Patient will improve bilateral hip extension and abduction strength by at least 1/2 MMT grade to promote ability to ambulate and perform chores with less back pain.    Time 8    Period Weeks    Status New    Target Date 08/11/20      PT LONG TERM GOAL #4   Title Patient will improve her lumbar spine FOTO by at least 10 points as a demonstration of improved function.    Baseline  Lumbar spine FOTO: 49 (06/15/2020)    Time 8    Period Weeks    Status New    Target Date 08/11/20                  Plan - 06/15/20 1908    Clinical Impression Statement Patient is an 84 year old female who came to physical therapy secondary to neck and low back pain. She also presents with altered gait pattern and posture, movement preference to around C5/C6 area at her cervical spine, bilateral scapular weakness, limited cervical and lumbar AROM, bilateral hip weakness, movement preference for R lumbar side bending as well as extension for low back, and difficulty performing functional tasks such as looking around, walking, and performing chores secondary to pain. Pt will benefit from skilled physical therapy services to address the aforementioned deficits.    Personal Factors and Comorbidities Comorbidity 3+;Age;Fitness;Past/Current Experience;Time since onset of injury/illness/exacerbation    Comorbidities Pacemaker, HTN, dysrhythmia    Examination-Activity Limitations Bed Mobility;Dressing;Transfers;Bend;Stairs;Carry    Stability/Clinical Decision Making Stable/Uncomplicated    Clinical Decision Making Low    Rehab Potential Fair    PT Frequency 2x / week    PT Duration 8 weeks    PT Treatment/Interventions Neuromuscular re-education;Therapeutic activities;Therapeutic exercise;Functional mobility training;Patient/family education;Manual techniques;Dry needling;Aquatic Therapy;Electrical Stimulation;Iontophoresis 4mg /ml Dexamethasone;Traction;Gait training;Stair training;Balance training    PT Next Visit Plan thoracic extension, scapular, trunk, hip strengthening, posture, manual techniques, modalities PRN    PT Home Exercise Plan Medbridge Access Code [email or web address]    Consulted and Agree with Plan of Care Patient           Patient will benefit from skilled therapeutic intervention in order to improve the following deficits and impairments:  Pain, Postural dysfunction, Improper body  mechanics, Difficulty walking, Decreased strength, Abnormal gait  Visit Diagnosis: Cervicalgia - Plan: PT plan of care cert/re-cert  Chronic bilateral low back pain, unspecified whether sciatica present - Plan: PT plan of care cert/re-cert  Muscle weakness (generalized) - Plan: PT plan of care cert/re-cert  Difficulty in walking, not elsewhere classified - Plan: PT plan of care cert/re-cert     Problem List Patient Active Problem List   Diagnosis Date Noted   Acute hyponatremia 06/26/2019   Generalized weakness 12/07/2018   Neck pain 05/13/2017   Sinus node dysfunction ([place]) 06/22/2015   PAF (paroxysmal atrial fibrillation) ([place]) 06/11/2014   URI (upper respiratory infection) 10/21/2013   Mixed hyperlipidemia 02/06/2012    Joneen Boers PT, DPT   06/15/2020, 7:25 PM  [place]  [facility] [address], [place], [address] Phone: [phone]   Fax:  [phone]  Name: Hailey Simmons MRN: [id number] Date of Birth: 1936-11-15

## 2020-06-15 NOTE — Patient Instructions (Signed)
Access Code: [email or web address] URL: [email or web address] Date: 06/15/2020 Prepared by: Joneen Boers  Exercises Seated Scapular Retraction - 5 x daily - 7 x weekly - 3 sets - 10 reps - 5 seconds hold Seated Cervical Retraction - 3 x daily - 7 x weekly - 3 sets - 10 reps - 5 seconds hold

## 2020-06-20 ENCOUNTER — Ambulatory Visit: Payer: PPO

## 2020-06-20 ENCOUNTER — Other Ambulatory Visit: Payer: Self-pay

## 2020-06-20 DIAGNOSIS — M542 Cervicalgia: Secondary | ICD-10-CM

## 2020-06-20 DIAGNOSIS — G8929 Other chronic pain: Secondary | ICD-10-CM

## 2020-06-20 DIAGNOSIS — M6281 Muscle weakness (generalized): Secondary | ICD-10-CM

## 2020-06-20 NOTE — Therapy (Signed)
[place] [facility] [address] [place], [address] Phone: [phone]   Fax:  [phone]  Physical Therapy Treatment  Patient Details  Name: Hailey Simmons MRN: [id number] Date of Birth: 07-Apr-1936 Referring Provider (PT): Kary Kos, MD   Encounter Date: 06/20/2020   PT End of Session - 06/20/20 1118    Visit Number 2    Number of Visits 17    Date for PT Re-Evaluation 08/11/20    Authorization Type 2    Authorization Time Period of 10 progress report    PT Start Time 1119    PT Stop Time 1203    PT Time Calculation (min) 44 min    Activity Tolerance Patient tolerated treatment well    Behavior During Therapy [facility] for tasks assessed/performed           Past Medical History:  Diagnosis Date  . Asthma 1970's - 1990's   a. ? 2/2 wood stove usage.  . Basal cell carcinoma of skin of nose   . Diverticulosis   . Dysrhythmia    A-FIB  . Goiter   . History of chicken pox   . Hx of colonic polyps   . Hypertension   . Hypothyroidism   . Menopause   . PAF (paroxysmal atrial fibrillation) ([place])    on apixoban  . Sinus node dysfunction ([facility])    a. s/p MDT dual chamber pacemaker  . Small bowel obstruction ([place]) 08/2009    Past Surgical History:  Procedure Laterality Date  . CATARACT EXTRACTION  02/2019  . CATARACT EXTRACTION  04/2019  . COLONOSCOPY    . COLONOSCOPY WITH PROPOFOL N/A 12/02/2018   Procedure: COLONOSCOPY WITH PROPOFOL;  Surgeon: Lollie Sails, MD;  Location: [facility] ENDOSCOPY;  Service: Endoscopy;  Laterality: N/A;  . EP IMPLANTABLE DEVICE N/A 06/22/2015   MDT MRI compatible dual chamber PPM implanted by Dr Caryl Comes for symptomatic bradycardia  . INSERT / REPLACE / REMOVE PACEMAKER      There were no vitals filed for this visit.   Subjective Assessment - 06/20/20 1120    Subjective Neck is about the same. 3/10 neck pain currently. Low back is about a 2/10 currently.    Pertinent History  Cervicalgia, lumbago with sciatica. Neck is currently bothering her the most. Neck pain began about 3 years ago. Sitting and doing nothing, pt is fine. However when pt performs chores such as cooking and cleaning or yardwork, or walking a while. Gradual onset of pain. Heating pad used to help. Stopped using heating pad because she uses aspercreme and she's not supposed to put a heating pad on it. Also has a pacemaker since 5 years ago.  Had x-ray for back and was told that her spine was crooked. Pt was also told that she might have had a fracture in her low back, also has arthritis, degenerative discs, spurring in her spine.  Low back pain has been bothering her on and off for the past 5-6 years. Denies loss of bowel or bladder control or LE paresthesia.  Sudden onset of back pain, unknown method of injury.  No falls within the past 6 months. No fear of falling.    Patient Stated Goals Walk, perform chores, cook more comfortably.    Currently in Pain? Yes    Pain Score 3     Pain Location Neck    Pain Onset More than a month ago  PT Education - 06/20/20 1145    Education Details ther-ex    Person(s) Educated Patient    Methods Explanation;Demonstration;Tactile cues;Verbal cues    Comprehension Returned demonstration;Verbalized understanding           Objective   Medbridge Access Code [email or web address]   Blood pressure is controlled per pt No latex band allergies  Cervical symptoms along C5 dermatome proximally    PACEMAKER Observation: pt tendency to protract neck.  Movement direction preference for low back: R side bend   Manual therapy Seated STM R cervical paraspinal muscles and rhomboid muscles and R distal scalene muscles to decrease tension   Decreased R lateral neck symptoms afterwards  Supine STM cervical paraspinal muscles   Less tension in supine compared to sitting   Therapeutic exercise  Sitting  with lumbar towel roll. Increased low back discomfort  Seated manually resisted L cervical lateral shift isometrics towards neutral to counter R lateral shift posture  10x5 seconds for 3 sets  Seated manually resisted scapular retraction targeting lower trap   R 10x5 seconds for 3 sets   Decreased tension to C7 spinous process palpated.    L 10x5 seconds for 3 sets  Supine cervical nodding 10x5 seconds for 3 sets   Seated cervical rotation with B scapular retraction.     try supine open books 10x5 seconds next visit if appropriate     Improved exercise technique, movement at target joints, use of target muscles after mod verbal, visual, tactile cues.      Response to treatment Pt tolerated session well without aggravation of symptoms.   Clinical impression Decreased R and L cervical discomfort with cervical rotation with treatment to decrease bilateral rhomboid, upper trap, and cervical paraspinal muscle tension. Pt will benefit from continued skilled physical therapy services to decrease pain, improve AROM, strength and function.      PT Short Term Goals - 06/15/20 1914      PT SHORT TERM GOAL #1   Title Patient will be independent with her HEP to decrease pain, improve cervical and lumbar AROM, improve strength and function.    Baseline Pt has started her HEP. (06/15/2020)    Time 3    Period Weeks    Status New    Target Date 07/07/20             PT Long Term Goals - 06/15/20 1915      PT LONG TERM GOAL #1   Title Patient will have a decrease in neck pain to 5/10 or less at worst to promote ability to perform chores as well as ambulate with less difficulty.    Baseline 10/10 at worst for the past 3 months (06/15/2020)    Time 8    Period Weeks    Status New    Target Date 08/11/20      PT LONG TERM GOAL #2   Title Patient will have a decrease in low back pain to 2/10 or less at most to promote ability to peform chores, as well as ambulate more  comfortably.    Baseline 5/10 low back pain at most for the past 3 months (06/15/2020)    Time 8    Period Weeks    Status New    Target Date 08/11/20      PT LONG TERM GOAL #3   Title Patient will improve bilateral hip extension and abduction strength by at least 1/2 MMT grade to promote ability to ambulate and perform chores with less  back pain.    Time 8    Period Weeks    Status New    Target Date 08/11/20      PT LONG TERM GOAL #4   Title Patient will improve her lumbar spine FOTO by at least 10 points as a demonstration of improved function.    Baseline Lumbar spine FOTO: 49 (06/15/2020)    Time 8    Period Weeks    Status New    Target Date 08/11/20                 Plan - 06/20/20 1147    Clinical Impression Statement Decreased R and L cervical discomfort with cervical rotation with treatment to decrease bilateral rhomboid, upper trap, and cervical paraspinal muscle tension. Pt will benefit from continued skilled physical therapy services to decrease pain, improve AROM, strength and function.    Personal Factors and Comorbidities Comorbidity 3+;Age;Fitness;Past/Current Experience;Time since onset of injury/illness/exacerbation    Comorbidities Pacemaker, HTN, dysrhythmia    Examination-Activity Limitations Bed Mobility;Dressing;Transfers;Bend;Stairs;Carry    Stability/Clinical Decision Making Stable/Uncomplicated    Rehab Potential Fair    PT Frequency 2x / week    PT Duration 8 weeks    PT Treatment/Interventions Neuromuscular re-education;Therapeutic activities;Therapeutic exercise;Functional mobility training;Patient/family education;Manual techniques;Dry needling;Aquatic Therapy;Electrical Stimulation;Iontophoresis 4mg /ml Dexamethasone;Traction;Gait training;Stair training;Balance training    PT Next Visit Plan thoracic extension, scapular, trunk, hip strengthening, posture, manual techniques, modalities PRN    PT Home Exercise Plan Medbridge Access Code [email or web address]     Consulted and Agree with Plan of Care Patient           Patient will benefit from skilled therapeutic intervention in order to improve the following deficits and impairments:  Pain, Postural dysfunction, Improper body mechanics, Difficulty walking, Decreased strength, Abnormal gait  Visit Diagnosis: Cervicalgia  Chronic bilateral low back pain, unspecified whether sciatica present  Muscle weakness (generalized)     Problem List Patient Active Problem List   Diagnosis Date Noted  . Acute hyponatremia 06/26/2019  . Generalized weakness 12/07/2018  . Neck pain 05/13/2017  . Sinus node dysfunction ([place]) 06/22/2015  . PAF (paroxysmal atrial fibrillation) ([place]) 06/11/2014  . URI (upper respiratory infection) 10/21/2013  . Mixed hyperlipidemia 02/06/2012    Joneen Boers PT, DPT   06/20/2020, 12:22 PM   [place] [facility] [address], [place], [address] Phone: [phone]   Fax:  [phone]  Name: LAURENE MELENDREZ MRN: [id number] Date of Birth: 1936-04-08

## 2020-06-22 ENCOUNTER — Ambulatory Visit: Payer: PPO

## 2020-06-22 ENCOUNTER — Other Ambulatory Visit: Payer: Self-pay

## 2020-06-22 DIAGNOSIS — G8929 Other chronic pain: Secondary | ICD-10-CM

## 2020-06-22 DIAGNOSIS — M542 Cervicalgia: Secondary | ICD-10-CM

## 2020-06-22 DIAGNOSIS — M6281 Muscle weakness (generalized): Secondary | ICD-10-CM

## 2020-06-22 DIAGNOSIS — R262 Difficulty in walking, not elsewhere classified: Secondary | ICD-10-CM

## 2020-06-22 NOTE — Patient Instructions (Signed)
Access Code: [email or web address] URL: [email or web address] Date: 06/22/2020 Prepared by: Joneen Boers  Exercises Seated Scapular Retraction - 5 x daily - 7 x weekly - 3 sets - 10 reps - 5 seconds hold Seated Cervical Retraction - 3 x daily - 7 x weekly - 3 sets - 10 reps - 5 seconds hold Supine Deep Neck Flexor Nods - 1 x daily - 7 x weekly - 3 sets - 10 reps - 5 seconds hold Supine Posterior Pelvic Tilt - 1 x daily - 7 x weekly - 3 sets - 10 reps - 5 seconds hold Hooklying Clamshell with Resistance - 1 x daily - 7 x weekly - 3 sets - 10 reps

## 2020-06-22 NOTE — Therapy (Signed)
[facility] [address], [place], [address] Phone: [phone]   Fax:  [phone]  Physical Therapy Treatment  Patient Details  Name: Hailey Simmons MRN: [id number] Date of Birth: August 29, 1936 Referring Provider (PT): Kary Kos, MD   Encounter Date: 06/22/2020   PT End of Session - 06/22/20 0945    Visit Number 3    Number of Visits 17    Date for PT Re-Evaluation 08/11/20    Authorization Type 3    Authorization Time Period of 10 progress report    PT Start Time 0945    PT Stop Time 1031    PT Time Calculation (min) 46 min    Activity Tolerance Patient tolerated treatment well    Behavior During Therapy [facility] for tasks assessed/performed           Past Medical History:  Diagnosis Date  . Asthma 1970's - 1990's   a. ? 2/2 wood stove usage.  . Basal cell carcinoma of skin of nose   . Diverticulosis   . Dysrhythmia    A-FIB  . Goiter   . History of chicken pox   . Hx of colonic polyps   . Hypertension   . Hypothyroidism   . Menopause   . PAF (paroxysmal atrial fibrillation) ([place])    on apixoban  . Sinus node dysfunction (HCC)    a. s/p MDT dual chamber pacemaker  . Small bowel obstruction ([place]) 08/2009    Past Surgical History:  Procedure Laterality Date  . CATARACT EXTRACTION  02/2019  . CATARACT EXTRACTION  04/2019  . COLONOSCOPY    . COLONOSCOPY WITH PROPOFOL N/A 12/02/2018   Procedure: COLONOSCOPY WITH PROPOFOL;  Surgeon: Lollie Sails, MD;  Location: [facility] ENDOSCOPY;  Service: Endoscopy;  Laterality: N/A;  . EP IMPLANTABLE DEVICE N/A 06/22/2015   MDT MRI compatible dual chamber PPM implanted by Dr Caryl Comes for symptomatic bradycardia  . INSERT / REPLACE / REMOVE PACEMAKER      There were no vitals filed for this visit.   Subjective Assessment - 06/22/20 0947    Subjective seems like she can look up a little bit better. 3/10 neck pulling when turning her head. Back is doing pretty  good. No back pain currently. Back bothers her mosty when she walks.    Pertinent History Cervicalgia, lumbago with sciatica. Neck is currently bothering her the most. Neck pain began about 3 years ago. Sitting and doing nothing, pt is fine. However when pt performs chores such as cooking and cleaning or yardwork, or walking a while. Gradual onset of pain. Heating pad used to help. Stopped using heating pad because she uses aspercreme and she's not supposed to put a heating pad on it. Also has a pacemaker since 5 years ago.  Had x-ray for back and was told that her spine was crooked. Pt was also told that she might have had a fracture in her low back, also has arthritis, degenerative discs, spurring in her spine.  Low back pain has been bothering her on and off for the past 5-6 years. Denies loss of bowel or bladder control or LE paresthesia.  Sudden onset of back pain, unknown method of injury.  No falls within the past 6 months. No fear of falling.    Patient Stated Goals Walk, perform chores, cook more comfortably.    Currently in Pain? Yes    Pain Score 3     Pain Location Neck  Pain Onset More than a month ago                                     PT Education - 06/22/20 1301    Education Details ther-ex, HEP    Person(s) Educated Patient    Methods Explanation;Demonstration;Tactile cues;Verbal cues    Comprehension Verbalized understanding;Returned demonstration          Objective   MedbridgeAccess Code [email or web address]   Blood pressure is controlled per pt No latex band allergies  Cervical symptoms along C5 dermatome proximally    PACEMAKER Observation: pt tendency to protract neck.  Movement direction preference for low back: R side bend   Manual therapy   Supine STM cervical paraspinal muscles  Supine suboccipital release               Seated STM R  rhomboid muscles  Decreased neck symtoms    Therapeutic exercise  Supine open  books 10x5 seconds to promote thoracic extension and pectoralis stretch  Supine B scapular retraction to promote thoracic extension 10x5 seconds  Supine chin tucks 10x3 with 5 second holds to promote upper thoracic extension and decrease lower cervical extension pressure  Supine cervical nodding 10x5 seconds for 3 sets  Supine posterior pelvic tilt 10x3 with 5 second holds    Then with clamshell red band 10x 3     Improved exercise technique, movement at target joints, use of target muscles after mod verbal, visual, tactile cues.      Response to treatment Pt tolerated session well without aggravation of symptoms.   Clinical impression Worked on improving upper thoracic extension and decreasing R rhomboid muscle tension to decrease stress to lower cervical spine. Worked on improving trunk and glute med strength to decrease low back stress during gait. Decreased neck symptoms with cervical rotation after session. Pt tolerated session well without aggravation of symptoms. Pt will benefit from continued skilled physical therapy services to decrease pain, improve strength and function.          PT Short Term Goals - 06/15/20 1914      PT SHORT TERM GOAL #1   Title Patient will be independent with her HEP to decrease pain, improve cervical and lumbar AROM, improve strength and function.    Baseline Pt has started her HEP. (06/15/2020)    Time 3    Period Weeks    Status New    Target Date 07/07/20             PT Long Term Goals - 06/15/20 1915      PT LONG TERM GOAL #1   Title Patient will have a decrease in neck pain to 5/10 or less at worst to promote ability to perform chores as well as ambulate with less difficulty.    Baseline 10/10 at worst for the past 3 months (06/15/2020)    Time 8    Period Weeks    Status New    Target Date 08/11/20      PT LONG TERM GOAL #2   Title Patient will have a decrease in low back pain to 2/10 or less at most to promote  ability to peform chores, as well as ambulate more comfortably.    Baseline 5/10 low back pain at most for the past 3 months (06/15/2020)    Time 8    Period Weeks    Status New  Target Date 08/11/20      PT LONG TERM GOAL #3   Title Patient will improve bilateral hip extension and abduction strength by at least 1/2 MMT grade to promote ability to ambulate and perform chores with less back pain.    Time 8    Period Weeks    Status New    Target Date 08/11/20      PT LONG TERM GOAL #4   Title Patient will improve her lumbar spine FOTO by at least 10 points as a demonstration of improved function.    Baseline Lumbar spine FOTO: 49 (06/15/2020)    Time 8    Period Weeks    Status New    Target Date 08/11/20                 Plan - 06/22/20 0944    Clinical Impression Statement Worked on improving upper thoracic extension and decreasing R rhomboid muscle tension to decrease stress to lower cervical spine. Worked on improving trunk and glute med strength to decrease low back stress during gait. Decreased neck symptoms with cervical rotation after session. Pt tolerated session well without aggravation of symptoms. Pt will benefit from continued skilled physical therapy services to decrease pain, improve strength and function.    Personal Factors and Comorbidities Comorbidity 3+;Age;Fitness;Past/Current Experience;Time since onset of injury/illness/exacerbation    Comorbidities Pacemaker, HTN, dysrhythmia    Examination-Activity Limitations Bed Mobility;Dressing;Transfers;Bend;Stairs;Carry    Stability/Clinical Decision Making Stable/Uncomplicated    Rehab Potential Fair    PT Frequency 2x / week    PT Duration 8 weeks    PT Treatment/Interventions Neuromuscular re-education;Therapeutic activities;Therapeutic exercise;Functional mobility training;Patient/family education;Manual techniques;Dry needling;Aquatic Therapy;Electrical Stimulation;Iontophoresis 4mg /ml  Dexamethasone;Traction;Gait training;Stair training;Balance training    PT Next Visit Plan thoracic extension, scapular, trunk, hip strengthening, posture, manual techniques, modalities PRN    PT Home Exercise Plan Medbridge Access Code [email or web address]    Consulted and Agree with Plan of Care Patient           Patient will benefit from skilled therapeutic intervention in order to improve the following deficits and impairments:  Pain, Postural dysfunction, Improper body mechanics, Difficulty walking, Decreased strength, Abnormal gait  Visit Diagnosis: Cervicalgia  Chronic bilateral low back pain, unspecified whether sciatica present  Muscle weakness (generalized)  Difficulty in walking, not elsewhere classified     Problem List Patient Active Problem List   Diagnosis Date Noted  . Acute hyponatremia 06/26/2019  . Generalized weakness 12/07/2018  . Neck pain 05/13/2017  . Sinus node dysfunction ([place]) 06/22/2015  . PAF (paroxysmal atrial fibrillation) ([place]) 06/11/2014  . URI (upper respiratory infection) 10/21/2013  . Mixed hyperlipidemia 02/06/2012    Joneen Boers PT, DPT   06/22/2020, 1:07 PM  [facility] [facility] [address], [place], [address] Phone: [phone]   Fax:  [phone]  Name: Hailey Simmons MRN: [id number] Date of Birth: 1936/08/31

## 2020-06-28 ENCOUNTER — Ambulatory Visit: Payer: PPO

## 2020-06-28 ENCOUNTER — Other Ambulatory Visit: Payer: Self-pay

## 2020-06-28 DIAGNOSIS — M545 Low back pain, unspecified: Secondary | ICD-10-CM

## 2020-06-28 DIAGNOSIS — M6281 Muscle weakness (generalized): Secondary | ICD-10-CM

## 2020-06-28 DIAGNOSIS — M542 Cervicalgia: Secondary | ICD-10-CM | POA: Diagnosis not present

## 2020-06-28 NOTE — Therapy (Signed)
[facility] [address] [place], [address] Phone: [phone]   Fax:  [phone]  Physical Therapy Treatment  Patient Details  Name: Hailey Simmons MRN: [id number] Date of Birth: Mar 20, 1936 Referring Provider (PT): Kary Kos, MD   Encounter Date: 06/28/2020   PT End of Session - 06/28/20 1114    Visit Number 4    Number of Visits 17    Date for PT Re-Evaluation 08/11/20    Authorization Type 4    Authorization Time Period of 10 progress report    PT Start Time 0845    PT Stop Time 0930    PT Time Calculation (min) 45 min    Activity Tolerance Patient tolerated treatment well    Behavior During Therapy [facility] for tasks assessed/performed           Past Medical History:  Diagnosis Date  . Asthma 1970's - 1990's   a. ? 2/2 wood stove usage.  . Basal cell carcinoma of skin of nose   . Diverticulosis   . Dysrhythmia    A-FIB  . Goiter   . History of chicken pox   . Hx of colonic polyps   . Hypertension   . Hypothyroidism   . Menopause   . PAF (paroxysmal atrial fibrillation) ([place])    on apixoban  . Sinus node dysfunction (HCC)    a. s/p MDT dual chamber pacemaker  . Small bowel obstruction ([place]) 08/2009    Past Surgical History:  Procedure Laterality Date  . CATARACT EXTRACTION  02/2019  . CATARACT EXTRACTION  04/2019  . COLONOSCOPY    . COLONOSCOPY WITH PROPOFOL N/A 12/02/2018   Procedure: COLONOSCOPY WITH PROPOFOL;  Surgeon: Lollie Sails, MD;  Location: [facility] ENDOSCOPY;  Service: Endoscopy;  Laterality: N/A;  . EP IMPLANTABLE DEVICE N/A 06/22/2015   MDT MRI compatible dual chamber PPM implanted by Dr Caryl Comes for symptomatic bradycardia  . INSERT / REPLACE / REMOVE PACEMAKER      There were no vitals filed for this visit.   Subjective Assessment - 06/28/20 1112    Subjective Pt reports that she feels less stiffness and cervical soreness since beginning PT.  She reports muscle  soreness in R lev scap and U/T region today.    Pertinent History Cervicalgia, lumbago with sciatica. Neck is currently bothering her the most. Neck pain began about 3 years ago. Sitting and doing nothing, pt is fine. However when pt performs chores such as cooking and cleaning or yardwork, or walking a while. Gradual onset of pain. Heating pad used to help. Stopped using heating pad because she uses aspercreme and she's not supposed to put a heating pad on it. Also has a pacemaker since 5 years ago.  Had x-ray for back and was told that her spine was crooked. Pt was also told that she might have had a fracture in her low back, also has arthritis, degenerative discs, spurring in her spine.  Low back pain has been bothering her on and off for the past 5-6 years. Denies loss of bowel or bladder control or LE paresthesia.  Sudden onset of back pain, unknown method of injury.  No falls within the past 6 months. No fear of falling.    Patient Stated Goals Walk, perform chores, cook more comfortably.    Currently in Pain? Yes    Pain Score 3     Pain Location Neck    Pain Orientation Right;Left;Posterior;Lateral  Pain Type Chronic pain    Pain Radiating Towards R U/T area    Pain Onset More than a month ago    Pain Frequency Occasional             Treatment:  Manual Therapy:  STM in sitting to R U/T, lev scap, and rhomboids; f/b supine suboccipital release and manual B U/T and lev scap stretches; gentle gr.2/3 rotational mobs to B cervical facet joints.  Therapeutic Exercises:  Supine chin tucks 2x10; hooklying abd/ER with RTB with TA squeeze and chin tucks 2x10; supine scap pinches 2x10; seated rows with cueing for postural alignment with YTB 2x10; seated B ER with YTB with cueing for postural alignment 2x10.                         PT Education - 06/28/20 1114    Education Details ther ex; HEP    Person(s) Educated Patient    Methods Explanation;Demonstration;Tactile  cues;Verbal cues    Comprehension Verbalized understanding;Returned demonstration            PT Short Term Goals - 06/15/20 1914      PT SHORT TERM GOAL #1   Title Patient will be independent with her HEP to decrease pain, improve cervical and lumbar AROM, improve strength and function.    Baseline Pt has started her HEP. (06/15/2020)    Time 3    Period Weeks    Status New    Target Date 07/07/20             PT Long Term Goals - 06/15/20 1915      PT LONG TERM GOAL #1   Title Patient will have a decrease in neck pain to 5/10 or less at worst to promote ability to perform chores as well as ambulate with less difficulty.    Baseline 10/10 at worst for the past 3 months (06/15/2020)    Time 8    Period Weeks    Status New    Target Date 08/11/20      PT LONG TERM GOAL #2   Title Patient will have a decrease in low back pain to 2/10 or less at most to promote ability to peform chores, as well as ambulate more comfortably.    Baseline 5/10 low back pain at most for the past 3 months (06/15/2020)    Time 8    Period Weeks    Status New    Target Date 08/11/20      PT LONG TERM GOAL #3   Title Patient will improve bilateral hip extension and abduction strength by at least 1/2 MMT grade to promote ability to ambulate and perform chores with less back pain.    Time 8    Period Weeks    Status New    Target Date 08/11/20      PT LONG TERM GOAL #4   Title Patient will improve her lumbar spine FOTO by at least 10 points as a demonstration of improved function.    Baseline Lumbar spine FOTO: 49 (06/15/2020)    Time 8    Period Weeks    Status New    Target Date 08/11/20                 Plan - 06/28/20 1115    Clinical Impression Statement Worked on improving postural alignment and strength, with focus on good position of cervical region in sitting and standing with postural exercises.  Personal Factors and Comorbidities Comorbidity 3+;Age;Fitness;Past/Current  Experience;Time since onset of injury/illness/exacerbation    Comorbidities Pacemaker, HTN, dysrhythmia    Examination-Activity Limitations Bed Mobility;Dressing;Transfers;Bend;Stairs;Carry    Stability/Clinical Decision Making Stable/Uncomplicated    Clinical Decision Making Low    Rehab Potential Fair    PT Frequency 2x / week    PT Duration 8 weeks    PT Treatment/Interventions Neuromuscular re-education;Therapeutic activities;Therapeutic exercise;Functional mobility training;Patient/family education;Manual techniques;Dry needling;Aquatic Therapy;Electrical Stimulation;Iontophoresis 4mg /ml Dexamethasone;Traction;Gait training;Stair training;Balance training    PT Next Visit Plan thoracic extension, scapular, trunk, hip strengthening, posture, manual techniques, modalities PRN    Consulted and Agree with Plan of Care Patient           Patient will benefit from skilled therapeutic intervention in order to improve the following deficits and impairments:  Pain, Postural dysfunction, Improper body mechanics, Difficulty walking, Decreased strength, Abnormal gait  Visit Diagnosis: Cervicalgia  Chronic bilateral low back pain, unspecified whether sciatica present  Muscle weakness (generalized)     Problem List Patient Active Problem List   Diagnosis Date Noted  . Acute hyponatremia 06/26/2019  . Generalized weakness 12/07/2018  . Neck pain 05/13/2017  . Sinus node dysfunction ([place]) 06/22/2015  . PAF (paroxysmal atrial fibrillation) ([place]) 06/11/2014  . URI (upper respiratory infection) 10/21/2013  . Mixed hyperlipidemia 02/06/2012    Jennessy Sandridge, MPT 06/28/2020, 11:17 AM  [facility] [address], [place], [address] Phone: [phone]   Fax:  [phone]  Name: EZRAH DEMBECK MRN: [id number] Date of Birth: 03/14/1936

## 2020-07-04 ENCOUNTER — Ambulatory Visit: Payer: PPO | Attending: Neurosurgery

## 2020-07-04 ENCOUNTER — Other Ambulatory Visit: Payer: Self-pay

## 2020-07-04 DIAGNOSIS — G8929 Other chronic pain: Secondary | ICD-10-CM | POA: Insufficient documentation

## 2020-07-04 DIAGNOSIS — M542 Cervicalgia: Secondary | ICD-10-CM | POA: Insufficient documentation

## 2020-07-04 DIAGNOSIS — M545 Low back pain: Secondary | ICD-10-CM | POA: Diagnosis not present

## 2020-07-04 DIAGNOSIS — M6281 Muscle weakness (generalized): Secondary | ICD-10-CM | POA: Diagnosis not present

## 2020-07-04 DIAGNOSIS — R262 Difficulty in walking, not elsewhere classified: Secondary | ICD-10-CM | POA: Insufficient documentation

## 2020-07-04 NOTE — Therapy (Signed)
[facility] [address], [place], [address] Phone: [phone]   Fax:  [phone]  Physical Therapy Treatment  Patient Details  Name: Hailey Simmons MRN: [id number] Date of Birth: [birth year] Referring Provider (PT): Kary Kos, MD   Encounter Date: 07/04/2020   PT End of Session - 07/04/20 1115    Visit Number 5    Number of Visits 17    Date for PT Re-Evaluation 08/11/20    Authorization Type 5    Authorization Time Period of 10 progress report    PT Start Time 1115    PT Stop Time 1200    PT Time Calculation (min) 45 min    Activity Tolerance Patient tolerated treatment well    Behavior During Therapy [facility] for tasks assessed/performed           Past Medical History:  Diagnosis Date  . Asthma 1970's - 1990's   a. ? 2/2 wood stove usage.  . Basal cell carcinoma of skin of nose   . Diverticulosis   . Dysrhythmia    A-FIB  . Goiter   . History of chicken pox   . Hx of colonic polyps   . Hypertension   . Hypothyroidism   . Menopause   . PAF (paroxysmal atrial fibrillation) ([place])    on apixoban  . Sinus node dysfunction (HCC)    a. s/p MDT dual chamber pacemaker  . Small bowel obstruction ([place]) 08/2009    Past Surgical History:  Procedure Laterality Date  . CATARACT EXTRACTION  02/2019  . CATARACT EXTRACTION  04/2019  . COLONOSCOPY    . COLONOSCOPY WITH PROPOFOL N/A 12/02/2018   Procedure: COLONOSCOPY WITH PROPOFOL;  Surgeon: Lollie Sails, MD;  Location[facility] ENDOSCOPY;  Service: Endoscopy;  Laterality: N/A;  . EP IMPLANTABLE DEVICE N/A 06/22/2015   MDT MRI compatible dual chamber PPM implanted by Dr Caryl Comes for symptomatic bradycardia  . INSERT / REPLACE / REMOVE PACEMAKER      There were no vitals filed for this visit.   Subjective Assessment - 07/04/20 1117    Subjective Just a little bit of difference. Does not have as much trouble holding her head up as she did. 2/10 neck pain  with cervical rotation. Mornings are best for her neck. 3/10 R low back pulling when walking. Morning is the best for her back as well.    Pertinent History Cervicalgia, lumbago with sciatica. Neck is currently bothering her the most. Neck pain began about 3 years ago. Sitting and doing nothing, pt is fine. However when pt performs chores such as cooking and cleaning or yardwork, or walking a while. Gradual onset of pain. Heating pad used to help. Stopped using heating pad because she uses aspercreme and she's not supposed to put a heating pad on it. Also has a pacemaker since 5 years ago.  Had x-ray for back and was told that her spine was crooked. Pt was also told that she might have had a fracture in her low back, also has arthritis, degenerative discs, spurring in her spine.  Low back pain has been bothering her on and off for the past 5-6 years. Denies loss of bowel or bladder control or LE paresthesia.  Sudden onset of back pain, unknown method of injury.  No falls within the past 6 months. No fear of falling.    Patient Stated Goals Walk, perform chores, cook more comfortably.    Currently in Pain? Yes  Pain Score 3     Pain Onset More than a month ago                                     PT Education - 07/04/20 1129    Education Details ther-ex    Person(s) Educated Patient    Methods Explanation;Demonstration;Tactile cues;Verbal cues    Comprehension Returned demonstration;Verbalized understanding            Objective   MedbridgeAccess Code [email or web address]   Blood pressure is controlled per pt No latex band allergies  Cervical symptoms along C5 dermatome proximally    PACEMAKER Observation: pt tendency to protract neck.  Movementdirectionpreference for low back: R side bend   Manual therapy  Seated STM R  rhomboid muscles          Seated STM cervical paraspinal muscles    Therapeutic exercise   Seated manually resisted R  scapular depression isometrics, PT manual resistance 10x5 seconds for 3 sets  Standing R shoulder extension with scapular retraction yellow band 10x5 seconds for 2 sets  Slight decreased in R cervical rotation symptoms   Seated chin tucks 10x5 seconds for 2 sets   Seated manually resisted L cervical lateral shift isometrics to counter R lateral shift posture of cervical spine  10x5 seconds for 3 sets  Decreased R cervical paraspinal muscle tension palpated  L cervical rotation with PT assist for neutral neck posture 10x3  Decreased pain with L cervical rotation      Improved exercise technique, movement at target joints, use of target muscles after mod verbal, visual, tactile cues.   Response to treatment Pt tolerated session well without aggravation of symptoms.   Clinical impression Worked on decreasing R rhomboid minor and cervical paraspinal muscle tension as well as decreasing R lateral shift posture of neck. Decreased pain with R and L cervical rotation after session. Pt will benefit from continued skilled physical therapy services to decrease pain, improve strength and function.        PT Short Term Goals - 06/15/20 1914      PT SHORT TERM GOAL #1   Title Patient will be independent with her HEP to decrease pain, improve cervical and lumbar AROM, improve strength and function.    Baseline Pt has started her HEP. (06/15/2020)    Time 3    Period Weeks    Status New    Target Date 07/07/20             PT Long Term Goals - 06/15/20 1915      PT LONG TERM GOAL #1   Title Patient will have a decrease in neck pain to 5/10 or less at worst to promote ability to perform chores as well as ambulate with less difficulty.    Baseline 10/10 at worst for the past 3 months (06/15/2020)    Time 8    Period Weeks    Status New    Target Date 08/11/20      PT LONG TERM GOAL #2   Title Patient will have a decrease in low back pain to 2/10 or less at most to promote  ability to peform chores, as well as ambulate more comfortably.    Baseline 5/10 low back pain at most for the past 3 months (06/15/2020)    Time 8    Period Weeks    Status New    Target  Date 08/11/20      PT LONG TERM GOAL #3   Title Patient will improve bilateral hip extension and abduction strength by at least 1/2 MMT grade to promote ability to ambulate and perform chores with less back pain.    Time 8    Period Weeks    Status New    Target Date 08/11/20      PT LONG TERM GOAL #4   Title Patient will improve her lumbar spine FOTO by at least 10 points as a demonstration of improved function.    Baseline Lumbar spine FOTO: 49 (06/15/2020)    Time 8    Period Weeks    Status New    Target Date 08/11/20                 Plan - 07/04/20 1115    Clinical Impression Statement Worked on decreasing R rhomboid minor and cervical paraspinal muscle tension as well as decreasing R lateral shift posture of neck. Decreased pain with R and L cervical rotation after session. Pt will benefit from continued skilled physical therapy services to decrease pain, improve strength and function.    Personal Factors and Comorbidities Comorbidity 3+;Age;Fitness;Past/Current Experience;Time since onset of injury/illness/exacerbation    Comorbidities Pacemaker, HTN, dysrhythmia    Examination-Activity Limitations Bed Mobility;Dressing;Transfers;Bend;Stairs;Carry    Stability/Clinical Decision Making Stable/Uncomplicated    Rehab Potential Fair    PT Frequency 2x / week    PT Duration 8 weeks    PT Treatment/Interventions Neuromuscular re-education;Therapeutic activities;Therapeutic exercise;Functional mobility training;Patient/family education;Manual techniques;Dry needling;Aquatic Therapy;Electrical Stimulation;Iontophoresis 4mg /ml Dexamethasone;Traction;Gait training;Stair training;Balance training    PT Next Visit Plan thoracic extension, scapular, trunk, hip strengthening, posture, manual  techniques, modalities PRN    Consulted and Agree with Plan of Care Patient           Patient will benefit from skilled therapeutic intervention in order to improve the following deficits and impairments:  Pain, Postural dysfunction, Improper body mechanics, Difficulty walking, Decreased strength, Abnormal gait  Visit Diagnosis: Cervicalgia  Muscle weakness (generalized)     Problem List Patient Active Problem List   Diagnosis Date Noted  . Acute hyponatremia 06/26/2019  . Generalized weakness 12/07/2018  . Neck pain 05/13/2017  . Sinus node dysfunction ([place]) 06/22/2015  . PAF (paroxysmal atrial fibrillation) ([place]) 06/11/2014  . URI (upper respiratory infection) 10/21/2013  . Mixed hyperlipidemia 02/06/2012    Joneen Boers PT, DPT   07/04/2020, 12:15 PM  [facility] [address] [place], [address] Phone: [phone]   Fax:  [phone]  Name: Hailey Simmons MRN: [id number] Date of Birth: September 18, 1936

## 2020-07-06 ENCOUNTER — Other Ambulatory Visit: Payer: Self-pay

## 2020-07-06 ENCOUNTER — Ambulatory Visit: Payer: PPO

## 2020-07-06 DIAGNOSIS — M542 Cervicalgia: Secondary | ICD-10-CM | POA: Diagnosis not present

## 2020-07-06 DIAGNOSIS — M6281 Muscle weakness (generalized): Secondary | ICD-10-CM

## 2020-07-06 NOTE — Therapy (Signed)
[facility] [address] [place], [address] Phone: [phone]   Fax:  [phone]  Physical Therapy Treatment  Patient Details  Name: Hailey Simmons MRN: [id number] Date of Birth: 24-Apr-1936 Referring Provider (PT): Kary Kos, MD   Encounter Date: 07/06/2020   PT End of Session - 07/06/20 0816    Visit Number 6    Number of Visits 17    Date for PT Re-Evaluation 08/11/20    Authorization Type 6    Authorization Time Period of 10 progress report    PT Start Time 0816    PT Stop Time 0858    PT Time Calculation (min) 42 min    Activity Tolerance Patient tolerated treatment well    Behavior During Therapy [facility] for tasks assessed/performed           Past Medical History:  Diagnosis Date  . Asthma 1970's - 1990's   a. ? 2/2 wood stove usage.  . Basal cell carcinoma of skin of nose   . Diverticulosis   . Dysrhythmia    A-FIB  . Goiter   . History of chicken pox   . Hx of colonic polyps   . Hypertension   . Hypothyroidism   . Menopause   . PAF (paroxysmal atrial fibrillation) ([place])    on apixoban  . Sinus node dysfunction (HCC)    a. s/p MDT dual chamber pacemaker  . Small bowel obstruction ([place]) 08/2009    Past Surgical History:  Procedure Laterality Date  . CATARACT EXTRACTION  02/2019  . CATARACT EXTRACTION  04/2019  . COLONOSCOPY    . COLONOSCOPY WITH PROPOFOL N/A 12/02/2018   Procedure: COLONOSCOPY WITH PROPOFOL;  Surgeon: Lollie Sails, MD;  Location: [facility] ENDOSCOPY;  Service: Endoscopy;  Laterality: N/A;  . EP IMPLANTABLE DEVICE N/A 06/22/2015   MDT MRI compatible dual chamber PPM implanted by Dr Caryl Comes for symptomatic bradycardia  . INSERT / REPLACE / REMOVE PACEMAKER      There were no vitals filed for this visit.   Subjective Assessment - 07/06/20 0817    Subjective Neck is feeling pretty good. 3/10 pulling with neck rotation. Neck is usually an 8/10 in the afternoon.     Pertinent History Cervicalgia, lumbago with sciatica. Neck is currently bothering her the most. Neck pain began about 3 years ago. Sitting and doing nothing, pt is fine. However when pt performs chores such as cooking and cleaning or yardwork, or walking a while. Gradual onset of pain. Heating pad used to help. Stopped using heating pad because she uses aspercreme and she's not supposed to put a heating pad on it. Also has a pacemaker since 5 years ago.  Had x-ray for back and was told that her spine was crooked. Pt was also told that she might have had a fracture in her low back, also has arthritis, degenerative discs, spurring in her spine.  Low back pain has been bothering her on and off for the past 5-6 years. Denies loss of bowel or bladder control or LE paresthesia.  Sudden onset of back pain, unknown method of injury.  No falls within the past 6 months. No fear of falling.    Patient Stated Goals Walk, perform chores, cook more comfortably.    Currently in Pain? Yes    Pain Score 3     Pain Onset More than a month ago  PT Education - 07/06/20 3664    Education Details ther-ex, HEP    Person(s) Educated Patient    Methods Explanation;Demonstration;Tactile cues;Verbal cues;Handout    Comprehension Returned demonstration;Verbalized understanding          Objective   MedbridgeAccess Code [email or web address]   Blood pressure is controlled per pt No latex band allergies  Cervical symptoms along C5 dermatome proximally    PACEMAKER Observation: pt tendency to protract neck.  Movementdirectionpreference for low back: R side bend    Therapeutic exercise  Self seated manually resisted L cervical lateral shift isometrics to counter R lateral shift posture of cervical spine     10x5 seconds for 3 sets  L scalene muscle activation palpated   Decreased symptoms with cervical rotation R and L  Seated cervical nod  isometrics, thumbs under chin 10x5 seconds for 3 sets  PT seated manually resisted L cervical lateral shift isometrics to counter R lateral shift posture of cervical spine     10x5 seconds for 3 sets  L cervical rotation with PT assist for neutral neck posture 10x3     Decreased pain with L cervical rotation  Seated R scapular depression isometrics, forearm on arm rest 10x5 seconds for 2 sets  Seated manually resisted scapular retraction isometrics targeting lower trap  R 10x5 seconds for 3 sets  Seated chin tucks to promote upper thoracic extension and decrease lower cervical stress 10x2    Improved exercise technique, movement at target joints, use of target muscles after mod verbal, visual, tactile cues.   Response to treatment Pt tolerated session well without aggravation of symptoms.   Clinical impression Decreased neck pain with treatment to improve anterior cervical, L lateral neck muscle strength as well as improving cervical posture. Pt overall making progress with decreasing neck pain with reports of her neck feeling better overall since starting PT. Pt will benefit from continued skilled physical therapy services to decrease pain, improve strength and function.        PT Short Term Goals - 06/15/20 1914      PT SHORT TERM GOAL #1   Title Patient will be independent with her HEP to decrease pain, improve cervical and lumbar AROM, improve strength and function.    Baseline Pt has started her HEP. (06/15/2020)    Time 3    Period Weeks    Status New    Target Date 07/07/20             PT Long Term Goals - 06/15/20 1915      PT LONG TERM GOAL #1   Title Patient will have a decrease in neck pain to 5/10 or less at worst to promote ability to perform chores as well as ambulate with less difficulty.    Baseline 10/10 at worst for the past 3 months (06/15/2020)    Time 8    Period Weeks    Status New    Target Date 08/11/20      PT LONG TERM GOAL #2    Title Patient will have a decrease in low back pain to 2/10 or less at most to promote ability to peform chores, as well as ambulate more comfortably.    Baseline 5/10 low back pain at most for the past 3 months (06/15/2020)    Time 8    Period Weeks    Status New    Target Date 08/11/20      PT LONG TERM GOAL #3   Title Patient will improve  bilateral hip extension and abduction strength by at least 1/2 MMT grade to promote ability to ambulate and perform chores with less back pain.    Time 8    Period Weeks    Status New    Target Date 08/11/20      PT LONG TERM GOAL #4   Title Patient will improve her lumbar spine FOTO by at least 10 points as a demonstration of improved function.    Baseline Lumbar spine FOTO: 49 (06/15/2020)    Time 8    Period Weeks    Status New    Target Date 08/11/20                 Plan - 07/06/20 8110    Clinical Impression Statement Decreased neck pain with treatment to improve anterior cervical, L lateral neck muscle strength as well as improving cervical posture. Pt overall making progress with decreasing neck pain with reports of her neck feeling better overall since starting PT. Pt will benefit from continued skilled physical therapy services to decrease pain, improve strength and function.    Personal Factors and Comorbidities Comorbidity 3+;Age;Fitness;Past/Current Experience;Time since onset of injury/illness/exacerbation    Comorbidities Pacemaker, HTN, dysrhythmia    Examination-Activity Limitations Bed Mobility;Dressing;Transfers;Bend;Stairs;Carry    Stability/Clinical Decision Making Stable/Uncomplicated    Rehab Potential Fair    PT Frequency 2x / week    PT Duration 8 weeks    PT Treatment/Interventions Neuromuscular re-education;Therapeutic activities;Therapeutic exercise;Functional mobility training;Patient/family education;Manual techniques;Dry needling;Aquatic Therapy;Electrical Stimulation;Iontophoresis 4mg /ml  Dexamethasone;Traction;Gait training;Stair training;Balance training    PT Next Visit Plan thoracic extension, scapular, trunk, hip strengthening, posture, manual techniques, modalities PRN    Consulted and Agree with Plan of Care Patient           Patient will benefit from skilled therapeutic intervention in order to improve the following deficits and impairments:  Pain, Postural dysfunction, Improper body mechanics, Difficulty walking, Decreased strength, Abnormal gait  Visit Diagnosis: Cervicalgia  Muscle weakness (generalized)     Problem List Patient Active Problem List   Diagnosis Date Noted  . Acute hyponatremia 06/26/2019  . Generalized weakness 12/07/2018  . Neck pain 05/13/2017  . Sinus node dysfunction ([place]) 06/22/2015  . PAF (paroxysmal atrial fibrillation) ([place]) 06/11/2014  . URI (upper respiratory infection) 10/21/2013  . Mixed hyperlipidemia 02/06/2012    Joneen Boers PT, DPT   07/06/2020, 9:07 AM  [facility] [address] [place], [address] Phone: [phone]   Fax:  [phone]  Name: Hailey Simmons MRN: [id number] Date of Birth: 1936/10/31

## 2020-07-06 NOTE — Patient Instructions (Addendum)
Cervical flexion isometric   Sitting, with thumbs under chin   Press your head moderately against your thumbs for 5 seconds   Repeat 10 times,    Perform 3 sets daily.     Access Code: [email or web address] URL: [email or web address] Date: 07/06/2020 Prepared by: Joneen Boers  Exercises Seated Scapular Retraction - 5 x daily - 7 x weekly - 3 sets - 10 reps - 5 seconds hold Seated Cervical Retraction - 3 x daily - 7 x weekly - 3 sets - 10 reps - 5 seconds hold Supine Deep Neck Flexor Nods - 1 x daily - 7 x weekly - 3 sets - 10 reps - 5 seconds hold Supine Posterior Pelvic Tilt - 1 x daily - 7 x weekly - 3 sets - 10 reps - 5 seconds hold Hooklying Clamshell with Resistance - 1 x daily - 7 x weekly - 3 sets - 10 reps Seated Isometric Cervical Sidebending - 1 x daily - 7 x weekly - 3 sets - 10 reps - 5 hold

## 2020-07-11 ENCOUNTER — Ambulatory Visit: Payer: PPO

## 2020-07-11 ENCOUNTER — Other Ambulatory Visit: Payer: Self-pay

## 2020-07-11 DIAGNOSIS — M542 Cervicalgia: Secondary | ICD-10-CM

## 2020-07-11 DIAGNOSIS — M545 Low back pain, unspecified: Secondary | ICD-10-CM

## 2020-07-11 DIAGNOSIS — R262 Difficulty in walking, not elsewhere classified: Secondary | ICD-10-CM

## 2020-07-11 DIAGNOSIS — M6281 Muscle weakness (generalized): Secondary | ICD-10-CM

## 2020-07-11 NOTE — Therapy (Signed)
[facility] [address] [place], [address] Phone: [phone]   Fax:  [phone]  Physical Therapy Treatment  Patient Details  Name: Hailey Simmons MRN: [id number] Date of Birth: 1936/02/27 Referring Provider (PT): Kary Kos, MD   Encounter Date: 07/11/2020   PT End of Session - 07/11/20 1459    Visit Number 7    Number of Visits 17    Date for PT Re-Evaluation 08/11/20    Authorization Type 7    Authorization Time Period of 10 progress report    PT Start Time 1115    PT Stop Time 1200    PT Time Calculation (min) 45 min    Activity Tolerance Patient tolerated treatment well    Behavior During Therapy [facility] for tasks assessed/performed           Past Medical History:  Diagnosis Date  . Asthma 1970's - 1990's   a. ? 2/2 wood stove usage.  . Basal cell carcinoma of skin of nose   . Diverticulosis   . Dysrhythmia    A-FIB  . Goiter   . History of chicken pox   . Hx of colonic polyps   . Hypertension   . Hypothyroidism   . Menopause   . PAF (paroxysmal atrial fibrillation) ([place])    on apixoban  . Sinus node dysfunction (HCC)    a. s/p MDT dual chamber pacemaker  . Small bowel obstruction ([place]) 08/2009    Past Surgical History:  Procedure Laterality Date  . CATARACT EXTRACTION  02/2019  . CATARACT EXTRACTION  04/2019  . COLONOSCOPY    . COLONOSCOPY WITH PROPOFOL N/A 12/02/2018   Procedure: COLONOSCOPY WITH PROPOFOL;  Surgeon: Lollie Sails, MD;  Location: [facility] ENDOSCOPY;  Service: Endoscopy;  Laterality: N/A;  . EP IMPLANTABLE DEVICE N/A 06/22/2015   MDT MRI compatible dual chamber PPM implanted by Dr Caryl Comes for symptomatic bradycardia  . INSERT / REPLACE / REMOVE PACEMAKER      There were no vitals filed for this visit.   Subjective Assessment - 07/11/20 1114    Subjective Patient reported that she is doing well, has some pain this morning with neck pain.    Pertinent History  Cervicalgia, lumbago with sciatica. Neck is currently bothering her the most. Neck pain began about 3 years ago. Sitting and doing nothing, pt is fine. However when pt performs chores such as cooking and cleaning or yardwork, or walking a while. Gradual onset of pain. Heating pad used to help. Stopped using heating pad because she uses aspercreme and she's not supposed to put a heating pad on it. Also has a pacemaker since 5 years ago.  Had x-ray for back and was told that her spine was crooked. Pt was also told that she might have had a fracture in her low back, also has arthritis, degenerative discs, spurring in her spine.  Low back pain has been bothering her on and off for the past 5-6 years. Denies loss of bowel or bladder control or LE paresthesia.  Sudden onset of back pain, unknown method of injury.  No falls within the past 6 months. No fear of falling.    Patient Stated Goals Walk, perform chores, cook more comfortably.    Currently in Pain? Yes    Pain Score 3     Pain Location Neck    Pain Orientation Right;Left;Proximal;Lateral    Pain Descriptors / Indicators Aching    Pain Type  Chronic pain            Objective    Medbridge Access Code [email or web address]     Blood pressure is controlled per pt No latex band allergies   Cervical symptoms along C5 dermatome proximally    PACEMAKER Observation: pt tendency to protract neck.  Movement direction preference for low back: R side bend     Therapeutic exercise   Self seated manually resisted L cervical lateral shift isometrics to counter R lateral shift posture of cervical spine     10x5 seconds for 3 sets     L scalene muscle activation palpated      Decreased symptoms with cervical rotation R and L   Seated cervical nod isometrics, thumbs under chin 10x5 seconds for 3 sets  SNAGS with towel and PT assist 2x10 to L, pt reported it "felt good"   PT seated manually resisted L cervical lateral shift isometrics to counter R lateral  shift posture of cervical spine     10x5 seconds for 3 sets   Shoulder shrug, protraction, depression 2x10 with mirror for visual feedback   Seated R scapular depression isometrics, forearm on arm rest 10x5 seconds for 2 sets   Standing manually resisted scapular retraction isometrics targeting lower trap     R 10x5 seconds for 3 sets   Seated chin tucks to promote upper thoracic extension and decrease lower cervical stress 10x3    manual therapy: subocciptal release and superficial cervical paraspinals and UT soft tissue mobilization x58mins  Improved exercise technique, movement at target joints, use of target muscles after mod verbal, visual, tactile cues.       Response to treatment Pt tolerated session well without aggravation of symptoms.    Clinical impression Pt reported decreased pain compared to start of session, reported SNAG intervention and suboccipital release decreased her pain the most. The patient needed multimodal cueing especially for scapular movement awareness/motor control. The patient would benefit from further skilled PT intervention to continue to progress towards goals.      PT Education - 07/11/20 1114    Education Details therex HEP    Person(s) Educated Patient    Methods Explanation;Demonstration;Tactile cues;Verbal cues;Handout    Comprehension Verbalized understanding;Returned demonstration            PT Short Term Goals - 06/15/20 1914      PT SHORT TERM GOAL #1   Title Patient will be independent with her HEP to decrease pain, improve cervical and lumbar AROM, improve strength and function.    Baseline Pt has started her HEP. (06/15/2020)    Time 3    Period Weeks    Status New    Target Date 07/07/20             PT Long Term Goals - 06/15/20 1915      PT LONG TERM GOAL #1   Title Patient will have a decrease in neck pain to 5/10 or less at worst to promote ability to perform chores as well as ambulate with less difficulty.     Baseline 10/10 at worst for the past 3 months (06/15/2020)    Time 8    Period Weeks    Status New    Target Date 08/11/20      PT LONG TERM GOAL #2   Title Patient will have a decrease in low back pain to 2/10 or less at most to promote ability to peform chores, as well as ambulate more comfortably.  Baseline 5/10 low back pain at most for the past 3 months (06/15/2020)    Time 8    Period Weeks    Status New    Target Date 08/11/20      PT LONG TERM GOAL #3   Title Patient will improve bilateral hip extension and abduction strength by at least 1/2 MMT grade to promote ability to ambulate and perform chores with less back pain.    Time 8    Period Weeks    Status New    Target Date 08/11/20      PT LONG TERM GOAL #4   Title Patient will improve her lumbar spine FOTO by at least 10 points as a demonstration of improved function.    Baseline Lumbar spine FOTO: 49 (06/15/2020)    Time 8    Period Weeks    Status New    Target Date 08/11/20                 Plan - 07/11/20 1115    Clinical Impression Statement Pt reported decreased pain compared to start of session, reported SNAG intervention and suboccipital release decreased her pain the most. The patient needed multimodal cueing especially for scapular movement awareness/motor control. The patient would benefit from further skilled PT intervention to continue to progress towards goals.    Personal Factors and Comorbidities Comorbidity 3+;Age;Fitness;Past/Current Experience;Time since onset of injury/illness/exacerbation    Comorbidities Pacemaker, HTN, dysrhythmia    Examination-Activity Limitations Bed Mobility;Dressing;Transfers;Bend;Stairs;Carry    Stability/Clinical Decision Making Stable/Uncomplicated    Rehab Potential Fair    PT Frequency 2x / week    PT Duration 8 weeks    PT Treatment/Interventions Neuromuscular re-education;Therapeutic activities;Therapeutic exercise;Functional mobility training;Patient/family  education;Manual techniques;Dry needling;Aquatic Therapy;Electrical Stimulation;Iontophoresis 4mg /ml Dexamethasone;Traction;Gait training;Stair training;Balance training    PT Next Visit Plan thoracic extension, scapular, trunk, hip strengthening, posture, manual techniques, modalities PRN    PT Home Exercise Plan Medbridge Access Code [email or web address]    Consulted and Agree with Plan of Care Patient           Patient will benefit from skilled therapeutic intervention in order to improve the following deficits and impairments:  Pain, Postural dysfunction, Improper body mechanics, Difficulty walking, Decreased strength, Abnormal gait  Visit Diagnosis: Cervicalgia  Muscle weakness (generalized)  Chronic bilateral low back pain, unspecified whether sciatica present  Difficulty in walking, not elsewhere classified     Problem List Patient Active Problem List   Diagnosis Date Noted  . Acute hyponatremia 06/26/2019  . Generalized weakness 12/07/2018  . Neck pain 05/13/2017  . Sinus node dysfunction ([place]) 06/22/2015  . PAF (paroxysmal atrial fibrillation) ([place]) 06/11/2014  . URI (upper respiratory infection) 10/21/2013  . Mixed hyperlipidemia 02/06/2012    Lieutenant Diego PT, DPT 3:04 PM,07/12/20   [place] [facility] [address], [place], [address] Phone: [phone]   Fax:  [phone]  Name: Hailey Simmons MRN: [id number] Date of Birth: 20-Jun-1936

## 2020-07-14 ENCOUNTER — Ambulatory Visit (INDEPENDENT_AMBULATORY_CARE_PROVIDER_SITE_OTHER): Payer: PPO | Admitting: *Deleted

## 2020-07-14 DIAGNOSIS — Z950 Presence of cardiac pacemaker: Secondary | ICD-10-CM | POA: Diagnosis not present

## 2020-07-14 LAB — CUP PACEART REMOTE DEVICE CHECK
Battery Remaining Longevity: 64 mo
Battery Voltage: 3 V
Brady Statistic AP VP Percent: 0.47 %
Brady Statistic AP VS Percent: 98.46 %
Brady Statistic AS VP Percent: 0 %
Brady Statistic AS VS Percent: 1.06 %
Brady Statistic RA Percent Paced: 98.92 %
Brady Statistic RV Percent Paced: 0.48 %
Date Time Interrogation Session: 20210812144503
Implantable Lead Implant Date: 20160720
Implantable Lead Implant Date: 20160720
Implantable Lead Location: 753859
Implantable Lead Location: 753860
Implantable Lead Model: 5076
Implantable Lead Model: 5076
Implantable Pulse Generator Implant Date: 20160720
Lead Channel Impedance Value: 323 Ohm
Lead Channel Impedance Value: 437 Ohm
Lead Channel Impedance Value: 475 Ohm
Lead Channel Impedance Value: 494 Ohm
Lead Channel Pacing Threshold Amplitude: 0.875 V
Lead Channel Pacing Threshold Amplitude: 0.875 V
Lead Channel Pacing Threshold Pulse Width: 0.4 ms
Lead Channel Pacing Threshold Pulse Width: 0.4 ms
Lead Channel Sensing Intrinsic Amplitude: 1 mV
Lead Channel Sensing Intrinsic Amplitude: 1 mV
Lead Channel Sensing Intrinsic Amplitude: 7.875 mV
Lead Channel Sensing Intrinsic Amplitude: 7.875 mV
Lead Channel Setting Pacing Amplitude: 1.75 V
Lead Channel Setting Pacing Amplitude: 2.5 V
Lead Channel Setting Pacing Pulse Width: 0.4 ms
Lead Channel Setting Sensing Sensitivity: 1.2 mV

## 2020-07-18 ENCOUNTER — Other Ambulatory Visit: Payer: Self-pay

## 2020-07-18 ENCOUNTER — Ambulatory Visit: Payer: PPO

## 2020-07-18 DIAGNOSIS — G8929 Other chronic pain: Secondary | ICD-10-CM

## 2020-07-18 DIAGNOSIS — M542 Cervicalgia: Secondary | ICD-10-CM | POA: Diagnosis not present

## 2020-07-18 DIAGNOSIS — M6281 Muscle weakness (generalized): Secondary | ICD-10-CM

## 2020-07-18 DIAGNOSIS — R262 Difficulty in walking, not elsewhere classified: Secondary | ICD-10-CM

## 2020-07-18 NOTE — Progress Notes (Signed)
Remote pacemaker transmission.   

## 2020-07-18 NOTE — Therapy (Signed)
[place] [facility] [address], [place], [address] Phone: [phone]   Fax:  [phone]  Physical Therapy Treatment  Patient Details  Name: Hailey Simmons MRN: [id number] Date of Birth: Jul 23, 1936 Referring Provider (PT): Kary Kos, MD   Encounter Date: 07/18/2020   PT End of Session - 07/18/20 0948    Visit Number 8    Number of Visits 17    Date for PT Re-Evaluation 08/11/20    Authorization Type 8    Authorization Time Period of 10 progress report    PT Start Time 0948    PT Stop Time 1035    PT Time Calculation (min) 47 min    Activity Tolerance Patient tolerated treatment well    Behavior During Therapy [facility] for tasks assessed/performed           Past Medical History:  Diagnosis Date  . Asthma 1970's - 1990's   a. ? 2/2 wood stove usage.  . Basal cell carcinoma of skin of nose   . Diverticulosis   . Dysrhythmia    A-FIB  . Goiter   . History of chicken pox   . Hx of colonic polyps   . Hypertension   . Hypothyroidism   . Menopause   . PAF (paroxysmal atrial fibrillation) ([place])    on apixoban  . Sinus node dysfunction (H[facility])    a. s/p MDT dual chamber pacemaker  . Small bowel obstruction ([place]) 08/2009    Past Surgical History:  Procedure Laterality Date  . CATARACT EXTRACTION  02/2019  . CATARACT EXTRACTION  04/2019  . COLONOSCOPY    . COLONOSCOPY WITH PROPOFOL N/A 12/02/2018   Procedure: COLONOSCOPY WITH PROPOFOL;  Surgeon: Lollie Sails, MD;  Location: [facility] ENDOSCOPY;  Service: Endoscopy;  Laterality: N/A;  . EP IMPLANTABLE DEVICE N/A 06/22/2015   MDT MRI compatible dual chamber PPM implanted by Dr Caryl Comes for symptomatic bradycardia  . INSERT / REPLACE / REMOVE PACEMAKER      There were no vitals filed for this visit.   Subjective Assessment - 07/18/20 0949    Subjective Neck is doing real good. Was able to walk this morning 10 rounds before her neck started bothering her which  made her very happy. Seeing improvement. A little stiff right now. Back bothers her when she walks but she can deal with it.    Pertinent History Cervicalgia, lumbago with sciatica. Neck is currently bothering her the most. Neck pain began about 3 years ago. Sitting and doing nothing, pt is fine. However when pt performs chores such as cooking and cleaning or yardwork, or walking a while. Gradual onset of pain. Heating pad used to help. Stopped using heating pad because she uses aspercreme and she's not supposed to put a heating pad on it. Also has a pacemaker since 5 years ago.  Had x-ray for back and was told that her spine was crooked. Pt was also told that she might have had a fracture in her low back, also has arthritis, degenerative discs, spurring in her spine.  Low back pain has been bothering her on and off for the past 5-6 years. Denies loss of bowel or bladder control or LE paresthesia.  Sudden onset of back pain, unknown method of injury.  No falls within the past 6 months. No fear of falling.    Patient Stated Goals Walk, perform chores, cook more comfortably.    Currently in Pain? No/denies  PT Education - 07/18/20 0951    Education Details ther-ex    Person(s) Educated Patient    Methods Explanation;Demonstration;Tactile cues;Verbal cues    Comprehension Returned demonstration;Verbalized understanding          Objective   MedbridgeAccess Code [email or web address]   Blood pressure is controlled per pt No latex band allergies  Cervical symptoms along C5 dermatome proximally   PACEMAKER Observation: pt tendency to protract neck.  Movementdirectionpreference for low back: R side bend   Therapeutic exercise  SNAGS with towel and PT assist 2x10 to L, pt reported it "feels better"  Self seated manually resisted L cervical lateral shift isometrics to counter R lateral shift posture of cervical spine 10x5  seconds for 3 sets improved neck comfort  Seated L lateral shift isometrics, PT manual resistance to counter R lateral shift posture 10x3 with 5 second holds  Decreased back pain with gait  Seated manually resisted trunk flexion isometrics 10x2 with 5 second holds  Low back discomfort which eases with rest.   Seated manually resisted trunk extension isometrics 10x3 with 5 seconds  Decreased back pain with gait after aforementioned treatment    Improved exercise technique, movement at target joints, use of target muscles after mod verbal, visual, tactile cues.   Manual therapy   Seated STM B UT muscles to decrease tension. Decreased neck stiffness reported  Response to treatment/ clinical impression Decreased neck pain with treatment to promote better cervical vertebral movement as well as decreasing upper trap muscle tension. Decrease low back pain with treatment to decrease R lateral shift posture as well as exercise to promote activation of extensor muscles. Pt making progress with PT towards goals. Pt will benefit from continued skilled physical therapy services to continue do decrease stiffness, pain, as well as improve strength and function.       PT Short Term Goals - 06/15/20 1914      PT SHORT TERM GOAL #1   Title Patient will be independent with her HEP to decrease pain, improve cervical and lumbar AROM, improve strength and function.    Baseline Pt has started her HEP. (06/15/2020)    Time 3    Period Weeks    Status New    Target Date 07/07/20             PT Long Term Goals - 06/15/20 1915      PT LONG TERM GOAL #1   Title Patient will have a decrease in neck pain to 5/10 or less at worst to promote ability to perform chores as well as ambulate with less difficulty.    Baseline 10/10 at worst for the past 3 months (06/15/2020)    Time 8    Period Weeks    Status New    Target Date 08/11/20      PT LONG TERM GOAL #2   Title Patient will have a decrease in  low back pain to 2/10 or less at most to promote ability to peform chores, as well as ambulate more comfortably.    Baseline 5/10 low back pain at most for the past 3 months (06/15/2020)    Time 8    Period Weeks    Status New    Target Date 08/11/20      PT LONG TERM GOAL #3   Title Patient will improve bilateral hip extension and abduction strength by at least 1/2 MMT grade to promote ability to ambulate and perform chores with less back pain.  Time 8    Period Weeks    Status New    Target Date 08/11/20      PT LONG TERM GOAL #4   Title Patient will improve her lumbar spine FOTO by at least 10 points as a demonstration of improved function.    Baseline Lumbar spine FOTO: 49 (06/15/2020)    Time 8    Period Weeks    Status New    Target Date 08/11/20                 Plan - 07/18/20 0956    Clinical Impression Statement Decreased neck pain with treatment to promote better cervical vertebral movement as well as decreasing upper trap muscle tension. Decrease low back pain with treatment to decrease R lateral shift posture as well as exercise to promote activation of extensor muscles. Pt making progress with PT towards goals. Pt will benefit from continued skilled physical therapy services to continue do decrease stiffness, pain, as well as improve strength and function.    Personal Factors and Comorbidities Comorbidity 3+;Age;Fitness;Past/Current Experience;Time since onset of injury/illness/exacerbation    Comorbidities Pacemaker, HTN, dysrhythmia    Examination-Activity Limitations Bed Mobility;Dressing;Transfers;Bend;Stairs;Carry    Stability/Clinical Decision Making Stable/Uncomplicated    Rehab Potential Fair    PT Frequency 2x / week    PT Duration 8 weeks    PT Treatment/Interventions Neuromuscular re-education;Therapeutic activities;Therapeutic exercise;Functional mobility training;Patient/family education;Manual techniques;Dry needling;Aquatic Therapy;Electrical  Stimulation;Iontophoresis 4mg /ml Dexamethasone;Traction;Gait training;Stair training;Balance training    PT Next Visit Plan thoracic extension, scapular, trunk, hip strengthening, posture, manual techniques, modalities PRN    PT Home Exercise Plan Medbridge Access Code [email or web address]    Consulted and Agree with Plan of Care Patient           Patient will benefit from skilled therapeutic intervention in order to improve the following deficits and impairments:  Pain, Postural dysfunction, Improper body mechanics, Difficulty walking, Decreased strength, Abnormal gait  Visit Diagnosis: Cervicalgia  Muscle weakness (generalized)  Chronic bilateral low back pain, unspecified whether sciatica present  Difficulty in walking, not elsewhere classified     Problem List Patient Active Problem List   Diagnosis Date Noted  . Acute hyponatremia 06/26/2019  . Generalized weakness 12/07/2018  . Neck pain 05/13/2017  . Sinus node dysfunction ([place]) 06/22/2015  . PAF (paroxysmal atrial fibrillation) ([place]) 06/11/2014  . URI (upper respiratory infection) 10/21/2013  . Mixed hyperlipidemia 02/06/2012    Joneen Boers PT, DPT   07/18/2020, 10:44 AM  [place] [facility] [address] [place], [address] Phone: [phone]   Fax:  [phone]  Name: Hailey Simmons MRN: [id number] Date of Birth: 07-Jun-1936

## 2020-07-20 ENCOUNTER — Other Ambulatory Visit: Payer: Self-pay | Admitting: Cardiovascular Disease

## 2020-07-20 ENCOUNTER — Ambulatory Visit: Payer: PPO

## 2020-07-20 ENCOUNTER — Other Ambulatory Visit: Payer: Self-pay

## 2020-07-20 DIAGNOSIS — M542 Cervicalgia: Secondary | ICD-10-CM

## 2020-07-20 DIAGNOSIS — M545 Low back pain, unspecified: Secondary | ICD-10-CM

## 2020-07-20 DIAGNOSIS — G8929 Other chronic pain: Secondary | ICD-10-CM

## 2020-07-20 DIAGNOSIS — E782 Mixed hyperlipidemia: Secondary | ICD-10-CM | POA: Diagnosis not present

## 2020-07-20 DIAGNOSIS — R262 Difficulty in walking, not elsewhere classified: Secondary | ICD-10-CM

## 2020-07-20 DIAGNOSIS — M6281 Muscle weakness (generalized): Secondary | ICD-10-CM

## 2020-07-20 NOTE — Telephone Encounter (Signed)
Please review for refill. Thanks!  

## 2020-07-20 NOTE — Telephone Encounter (Signed)
Pt's age 84, wt 74.8 kg, SCr 0.7, CrCl 74.43, last ov w/ TG 04/19/20.

## 2020-07-20 NOTE — Therapy (Signed)
[facility] [address] [place], [address] Phone: [phone]   Fax:  [phone]  Physical Therapy Treatment  Patient Details  Name: SHERRIKA WEAKLAND MRN: [id number] Date of Birth: 1936-02-24 Referring Provider (PT): Kary Kos, MD   Encounter Date: 07/20/2020   PT End of Session - 07/20/20 0818    Visit Number 9    Number of Visits 17    Date for PT Re-Evaluation 08/11/20    Authorization Type 9    Authorization Time Period of 10 progress report    PT Start Time 0818    PT Stop Time 0901    PT Time Calculation (min) 43 min    Activity Tolerance Patient tolerated treatment well    Behavior During Therapy [facility] for tasks assessed/performed           Past Medical History:  Diagnosis Date  . Asthma 1970's - 1990's   a. ? 2/2 wood stove usage.  . Basal cell carcinoma of skin of nose   . Diverticulosis   . Dysrhythmia    A-FIB  . Goiter   . History of chicken pox   . Hx of colonic polyps   . Hypertension   . Hypothyroidism   . Menopause   . PAF (paroxysmal atrial fibrillation) ([place])    on apixoban  . Sinus node dysfunction (HCC)    a. s/p MDT dual chamber pacemaker  . Small bowel obstruction ([place]) 08/2009    Past Surgical History:  Procedure Laterality Date  . CATARACT EXTRACTION  02/2019  . CATARACT EXTRACTION  04/2019  . COLONOSCOPY    . COLONOSCOPY WITH PROPOFOL N/A 12/02/2018   Procedure: COLONOSCOPY WITH PROPOFOL;  Surgeon: Lollie Sails, MD;  Location: [facility] ENDOSCOPY;  Service: Endoscopy;  Laterality: N/A;  . EP IMPLANTABLE DEVICE N/A 06/22/2015   MDT MRI compatible dual chamber PPM implanted by Dr Caryl Comes for symptomatic bradycardia  . INSERT / REPLACE / REMOVE PACEMAKER      There were no vitals filed for this visit.   Subjective Assessment - 07/20/20 0819    Subjective Neck is improving. Its wonderful. Stiffness. Back is doing pretty good. No back pain this morning. Back would  bother her when she takes care of her 36 and 53 year old grandchildren.  5/10 neck pain at most for the past 7 days. 4/10 low back pain at most for the past 7 days, depending on how active she is.    Pertinent History Cervicalgia, lumbago with sciatica. Neck is currently bothering her the most. Neck pain began about 3 years ago. Sitting and doing nothing, pt is fine. However when pt performs chores such as cooking and cleaning or yardwork, or walking a while. Gradual onset of pain. Heating pad used to help. Stopped using heating pad because she uses aspercreme and she's not supposed to put a heating pad on it. Also has a pacemaker since 5 years ago.  Had x-ray for back and was told that her spine was crooked. Pt was also told that she might have had a fracture in her low back, also has arthritis, degenerative discs, spurring in her spine.  Low back pain has been bothering her on and off for the past 5-6 years. Denies loss of bowel or bladder control or LE paresthesia.  Sudden onset of back pain, unknown method of injury.  No falls within the past 6 months. No fear of falling.    Patient Stated Goals  Walk, perform chores, cook more comfortably.    Currently in Pain? No/denies              [facility] PT Assessment - 07/20/20 1200      Observation/Other Assessments   Focus on Therapeutic Outcomes (FOTO)  Lumbar foto 44                                 PT Education - 07/20/20 0831    Education Details ther-ex    Person(s) Educated Patient    Methods Explanation;Demonstration;Tactile cues;Verbal cues    Comprehension Returned demonstration;Verbalized understanding          Objective   MedbridgeAccess Code [email or web address]   Blood pressure is controlled per pt No latex band allergies  Cervical symptoms along C5 dermatome proximally   PACEMAKER Observation: pt tendency to protract neck.  Movementdirectionpreference for low back: R side bend   Therapeutic  exercise  SNAGS with towel and PT assist 2x10to L  Self seated manually resisted L cervical lateral shift isometrics to counter R lateral shift posture of cervical spine 10x5 seconds for 3 sets  Seated B scapular retraction red band 10x3  hooklying deep cervical flexion 5x2  hooklying open book to promote thoracic extension 10x2  hooklying posterior pelvic tilt 10x5 seconds for 2 sets  SLS with B UE assist, emphasis on level pelvis to promote glute med strength and lumbopelvic control during gait.  R 10x5 seconds   L 10x5 seconds           Improved exercise technique, movement at target joints, use of target muscles after min to mod verbal, visual, tactile cues.    Response to treatment/ clinical impression Improving overall neck and back pain based on subjective reports. Continued working on improving thoracic extension and anterior cervical muscle strengthening to decrease stress to lower cervical spine as well as thoracic extension and core strengthening to decrease stress to low back. Pt tolerated session well without aggravation of symptoms. Pt will benefit from continued skilled physical therapy services to decrease pain, improve strength and function.           PT Short Term Goals - 06/15/20 1914      PT SHORT TERM GOAL #1   Title Patient will be independent with her HEP to decrease pain, improve cervical and lumbar AROM, improve strength and function.    Baseline Pt has started her HEP. (06/15/2020)    Time 3    Period Weeks    Status New    Target Date 07/07/20             PT Long Term Goals - 06/15/20 1915      PT LONG TERM GOAL #1   Title Patient will have a decrease in neck pain to 5/10 or less at worst to promote ability to perform chores as well as ambulate with less difficulty.    Baseline 10/10 at worst for the past 3 months (06/15/2020)    Time 8    Period Weeks    Status New    Target Date 08/11/20      PT LONG TERM GOAL #2   Title  Patient will have a decrease in low back pain to 2/10 or less at most to promote ability to peform chores, as well as ambulate more comfortably.    Baseline 5/10 low back pain at most for the past 3 months (06/15/2020)  Time 8    Period Weeks    Status New    Target Date 08/11/20      PT LONG TERM GOAL #3   Title Patient will improve bilateral hip extension and abduction strength by at least 1/2 MMT grade to promote ability to ambulate and perform chores with less back pain.    Time 8    Period Weeks    Status New    Target Date 08/11/20      PT LONG TERM GOAL #4   Title Patient will improve her lumbar spine FOTO by at least 10 points as a demonstration of improved function.    Baseline Lumbar spine FOTO: 49 (06/15/2020)    Time 8    Period Weeks    Status New    Target Date 08/11/20                 Plan - 07/20/20 0818    Clinical Impression Statement Improving overall neck and back pain based on subjective reports. Continued working on improving thoracic extension and anterior cervical muscle strengthening to decrease stress to lower cervical spine as well as thoracic extension and core strengthening to decrease stress to low back. Pt tolerated session well without aggravation of symptoms. Pt will benefit from continued skilled physical therapy services to decrease pain, improve strength and function.    Personal Factors and Comorbidities Comorbidity 3+;Age;Fitness;Past/Current Experience;Time since onset of injury/illness/exacerbation    Comorbidities Pacemaker, HTN, dysrhythmia    Examination-Activity Limitations Bed Mobility;Dressing;Transfers;Bend;Stairs;Carry    Stability/Clinical Decision Making Stable/Uncomplicated    Rehab Potential Fair    PT Frequency 2x / week    PT Duration 8 weeks    PT Treatment/Interventions Neuromuscular re-education;Therapeutic activities;Therapeutic exercise;Functional mobility training;Patient/family education;Manual techniques;Dry  needling;Aquatic Therapy;Electrical Stimulation;Iontophoresis 4mg /ml Dexamethasone;Traction;Gait training;Stair training;Balance training    PT Next Visit Plan thoracic extension, scapular, trunk, hip strengthening, posture, manual techniques, modalities PRN    PT Home Exercise Plan Medbridge Access Code [email or web address]    Consulted and Agree with Plan of Care Patient           Patient will benefit from skilled therapeutic intervention in order to improve the following deficits and impairments:  Pain, Postural dysfunction, Improper body mechanics, Difficulty walking, Decreased strength, Abnormal gait  Visit Diagnosis: Cervicalgia  Muscle weakness (generalized)  Chronic bilateral low back pain, unspecified whether sciatica present  Difficulty in walking, not elsewhere classified     Problem List Patient Active Problem List   Diagnosis Date Noted  . Acute hyponatremia 06/26/2019  . Generalized weakness 12/07/2018  . Neck pain 05/13/2017  . Sinus node dysfunction ([place]) 06/22/2015  . PAF (paroxysmal atrial fibrillation) ([place]) 06/11/2014  . URI (upper respiratory infection) 10/21/2013  . Mixed hyperlipidemia 02/06/2012    Joneen Boers PT, DPT   07/20/2020, 12:02 PM  [facility] [address], [place], [address] Phone: [phone]   Fax:  [phone]  Name: JESSACA PHILIPPI MRN: [id number] Date of Birth: 12-31-1935

## 2020-07-25 ENCOUNTER — Other Ambulatory Visit: Payer: Self-pay

## 2020-07-25 ENCOUNTER — Ambulatory Visit: Payer: PPO

## 2020-07-25 DIAGNOSIS — M6281 Muscle weakness (generalized): Secondary | ICD-10-CM

## 2020-07-25 DIAGNOSIS — R262 Difficulty in walking, not elsewhere classified: Secondary | ICD-10-CM

## 2020-07-25 DIAGNOSIS — M542 Cervicalgia: Secondary | ICD-10-CM

## 2020-07-25 DIAGNOSIS — G8929 Other chronic pain: Secondary | ICD-10-CM

## 2020-07-25 NOTE — Therapy (Signed)
[facility] [address], [place], [address] Phone: [phone]   Fax:  [phone]  Physical Therapy Treatment And Progress Report Reporting Period (06/15/2020 - 07/25/2020)  Patient Details  Name: Hailey Simmons MRN: [id number] Date of Birth: 04/28/1936 Referring Provider (PT): Kary Kos, MD   Encounter Date: 07/25/2020   PT End of Session - 07/25/20 1436    Visit Number 10    Number of Visits 17    Date for PT Re-Evaluation 08/11/20    Authorization Type 10    Authorization Time Period of 10 progress report    PT Start Time 1436    PT Stop Time 1515    PT Time Calculation (min) 39 min    Activity Tolerance Patient tolerated treatment well    Behavior During Therapy [facility] for tasks assessed/performed           Past Medical History:  Diagnosis Date  . Asthma 1970's - 1990's   a. ? 2/2 wood stove usage.  . Basal cell carcinoma of skin of nose   . Diverticulosis   . Dysrhythmia    A-FIB  . Goiter   . History of chicken pox   . Hx of colonic polyps   . Hypertension   . Hypothyroidism   . Menopause   . PAF (paroxysmal atrial fibrillation) ([place])    on apixoban  . Sinus node dysfunction (HCC)    a. s/p MDT dual chamber pacemaker  . Small bowel obstruction ([place]) 08/2009    Past Surgical History:  Procedure Laterality Date  . CATARACT EXTRACTION  02/2019  . CATARACT EXTRACTION  04/2019  . COLONOSCOPY    . COLONOSCOPY WITH PROPOFOL N/A 12/02/2018   Procedure: COLONOSCOPY WITH PROPOFOL;  Surgeon: Lollie Sails, MD;  Location: [facility] ENDOSCOPY;  Service: Endoscopy;  Laterality: N/A;  . EP IMPLANTABLE DEVICE N/A 06/22/2015   MDT MRI compatible dual chamber PPM implanted by Dr Caryl Comes for symptomatic bradycardia  . INSERT / REPLACE / REMOVE PACEMAKER      There were no vitals filed for this visit.   Subjective Assessment - 07/25/20 1437    Subjective Neck is about a 4 now. Was about a 7/10 an  hour ago. Baked a cake did the laundry. Getting up out of bed in the morning is best. Neck starts catching up with her when she does stuff.  7/10 at most neck pain for the past 7 days. Wonders if doing stuff on her feet pulls on her back then her neck. 5/10 back pain at most for the past 7 days.    Pertinent History Cervicalgia, lumbago with sciatica. Neck is currently bothering her the most. Neck pain began about 3 years ago. Sitting and doing nothing, pt is fine. However when pt performs chores such as cooking and cleaning or yardwork, or walking a while. Gradual onset of pain. Heating pad used to help. Stopped using heating pad because she uses aspercreme and she's not supposed to put a heating pad on it. Also has a pacemaker since 5 years ago.  Had x-ray for back and was told that her spine was crooked. Pt was also told that she might have had a fracture in her low back, also has arthritis, degenerative discs, spurring in her spine.  Low back pain has been bothering her on and off for the past 5-6 years. Denies loss of bowel or bladder control or LE paresthesia.  Sudden onset of back pain,  unknown method of injury.  No falls within the past 6 months. No fear of falling.    Patient Stated Goals Walk, perform chores, cook more comfortably.    Currently in Pain? Yes    Pain Score 4               [facility] PT Assessment - 07/25/20 1441      Strength   Right Hip Extension 4+/5   seated, manually resisted   Right Hip ABduction 4-/5    Left Hip Flexion 4/5    Left Hip Extension 4/5   seated, manually resisted                                PT Education - 07/25/20 1451    Education Details ther-ex    Person(s) Educated Patient    Methods Explanation;Demonstration;Tactile cues;Verbal cues    Comprehension Returned demonstration;Verbalized understanding          Objective   MedbridgeAccess Code [email or web address]   Blood pressure is controlled per pt No latex band  allergies  Cervical symptoms along C5 dermatome proximally   PACEMAKER Observation: pt tendency to protract neck.  Movementdirectionpreference for low back: R side bend   Therapeutic exercise  Seated manually resisted hip extension, S/L hip abduction 1-2x each way for each LE   S/L hip abduction with PT assist  R 10x3  L 10x3  Supine posterior pelvic tilt 10x10 seconds for 2 sets  SNAGS with towel and PT assist 2x10to L  Pt tendency for cervical protaction  Self seated manually resisted L cervical lateral shift isometrics to counter R lateral shift posture of cervical spine 10x5 seconds for 3 sets  Decreased neck pain with L cervical rotation   Seated L cervical rotation with neutral neck 10x  Improved exercise technique, movement at target joints, use of target muscles after min to mod verbal, visual, tactile cues.   Response to treatment/ clinical impression Pt demonstrates improved bilateral hip extension and abduction strength, and decreased neck pain since initial evaluation. Focused on improving neck level of comfort secondary to the area being most important to patient. Decreased overall neck pain with treatment to promote better vertebral L rotation as well as decreasing R cervical lateral shift posture. Back pain levels similar to eval level secondary to focus on treating the neck. Overall, pt is making progress with PT towards goals. Pt will benefit from continued skilled physical therapy services to decrease pain, improve ROM, strength and function.            PT Short Term Goals - 07/25/20 1512      PT SHORT TERM GOAL #1   Title Patient will be independent with her HEP to decrease pain, improve cervical and lumbar AROM, improve strength and function.    Baseline Pt has started her HEP. (06/15/2020); Pt performing HEP, no questions (07/25/2020)    Time 3    Period Weeks    Status Achieved    Target Date 07/07/20             PT Long  Term Goals - 07/25/20 1439      PT LONG TERM GOAL #1   Title Patient will have a decrease in neck pain to 5/10 or less at worst to promote ability to perform chores as well as ambulate with less difficulty.    Baseline 10/10 at worst for the past 3 months (06/15/2020); 7/10 at most  for the past 7 days (07/25/2020)    Time 8    Period Weeks    Status Partially Met    Target Date 08/11/20      PT LONG TERM GOAL #2   Title Patient will have a decrease in low back pain to 2/10 or less at most to promote ability to peform chores, as well as ambulate more comfortably.    Baseline 5/10 low back pain at most for the past 3 months (06/15/2020); 5/10 at most for the past 7 days (07/25/2020)    Time 8    Period Weeks    Status On-going    Target Date 08/11/20      PT LONG TERM GOAL #3   Title Patient will improve bilateral hip extension and abduction strength by at least 1/2 MMT grade to promote ability to ambulate and perform chores with less back pain.    Time 8    Period Weeks    Status Achieved    Target Date 08/11/20      PT LONG TERM GOAL #4   Title Patient will improve her lumbar spine FOTO by at least 10 points as a demonstration of improved function.    Baseline Lumbar spine FOTO: 49 (06/15/2020); 44 (07/20/2020)    Time 8    Period Weeks    Status On-going    Target Date 08/11/20                 Plan - 07/25/20 1448    Clinical Impression Statement Pt demonstrates improved bilateral hip extension and abduction strength, and decreased neck pain since initial evaluation. Focused on improving neck level of comfort secondary to the area being most important to patient. Decreased overall neck pain with treatment to promote better vertebral L rotation as well as decreasing R cervical lateral shift posture. Back pain levels similar to eval level secondary to focus on treating the neck. Overall, pt is making progress with PT towards goals. Pt will benefit from continued skilled physical  therapy services to decrease pain, improve ROM, strength and function.    Personal Factors and Comorbidities Comorbidity 3+;Age;Fitness;Past/Current Experience;Time since onset of injury/illness/exacerbation    Comorbidities Pacemaker, HTN, dysrhythmia    Examination-Activity Limitations Bed Mobility;Dressing;Transfers;Bend;Stairs;Carry    Stability/Clinical Decision Making Stable/Uncomplicated    Rehab Potential Fair    PT Frequency 2x / week    PT Duration 8 weeks    PT Treatment/Interventions Neuromuscular re-education;Therapeutic activities;Therapeutic exercise;Functional mobility training;Patient/family education;Manual techniques;Dry needling;Aquatic Therapy;Electrical Stimulation;Iontophoresis 31m/ml Dexamethasone;Traction;Gait training;Stair training;Balance training    PT Next Visit Plan thoracic extension, scapular, trunk, hip strengthening, posture, manual techniques, modalities PRN    PT Home Exercise Plan Medbridge Access Code [email or web address]   Consulted and Agree with Plan of Care Patient           Patient will benefit from skilled therapeutic intervention in order to improve the following deficits and impairments:  Pain, Postural dysfunction, Improper body mechanics, Difficulty walking, Decreased strength, Abnormal gait  Visit Diagnosis: Cervicalgia  Muscle weakness (generalized)  Chronic bilateral low back pain, unspecified whether sciatica present  Difficulty in walking, not elsewhere classified     Problem List Patient Active Problem List   Diagnosis Date Noted  . Acute hyponatremia 06/26/2019  . Generalized weakness 12/07/2018  . Neck pain 05/13/2017  . Sinus node dysfunction ([place] 06/22/2015  . PAF (paroxysmal atrial fibrillation) ([place] 06/11/2014  . URI (upper respiratory infection) 10/21/2013  . Mixed hyperlipidemia 02/06/2012    Thank  you for your referral.  Joneen Boers PT, DPT   07/25/2020, 7:32 PM  [facility] [address], [place], [address] Phone: [phone]   Fax:  [phone]  Name: KASHA HOWETH MRN: [id number] Date of Birth: December 28, 1935

## 2020-07-26 ENCOUNTER — Ambulatory Visit: Payer: PPO

## 2020-07-26 DIAGNOSIS — G8929 Other chronic pain: Secondary | ICD-10-CM

## 2020-07-26 DIAGNOSIS — R262 Difficulty in walking, not elsewhere classified: Secondary | ICD-10-CM

## 2020-07-26 DIAGNOSIS — M542 Cervicalgia: Secondary | ICD-10-CM | POA: Diagnosis not present

## 2020-07-26 DIAGNOSIS — M6281 Muscle weakness (generalized): Secondary | ICD-10-CM

## 2020-07-26 NOTE — Therapy (Signed)
[place] [facility] [address] [place], [address] Phone: [phone]   Fax:  [phone]  Physical Therapy Treatment  Patient Details  Name: STEPHANIEMARIE Simmons MRN: [id number] Date of Birth: Oct 26, 1936 Referring Provider (PT): Kary Kos, MD   Encounter Date: 07/26/2020   PT End of Session - 07/26/20 0908    Visit Number 11    Number of Visits 17    Date for PT Re-Evaluation 08/11/20    Authorization Type 1    Authorization Time Period of 10 progress report    PT Start Time 0908    PT Stop Time 0949    PT Time Calculation (min) 41 min    Activity Tolerance Patient tolerated treatment well    Behavior During Therapy [facility] for tasks assessed/performed           Past Medical History:  Diagnosis Date  . Asthma 1970's - 1990's   a. ? 2/2 wood stove usage.  . Basal cell carcinoma of skin of nose   . Diverticulosis   . Dysrhythmia    A-FIB  . Goiter   . History of chicken pox   . Hx of colonic polyps   . Hypertension   . Hypothyroidism   . Menopause   . PAF (paroxysmal atrial fibrillation) ([place])    on apixoban  . Sinus node dysfunction (HCC)    a. s/p MDT dual chamber pacemaker  . Small bowel obstruction ([place]) 08/2009    Past Surgical History:  Procedure Laterality Date  . CATARACT EXTRACTION  02/2019  . CATARACT EXTRACTION  04/2019  . COLONOSCOPY    . COLONOSCOPY WITH PROPOFOL N/A 12/02/2018   Procedure: COLONOSCOPY WITH PROPOFOL;  Surgeon: Lollie Sails, MD;  Location: [facility] ENDOSCOPY;  Service: Endoscopy;  Laterality: N/A;  . EP IMPLANTABLE DEVICE N/A 06/22/2015   MDT MRI compatible dual chamber PPM implanted by Dr Caryl Comes for symptomatic bradycardia  . INSERT / REPLACE / REMOVE PACEMAKER      There were no vitals filed for this visit.   Subjective Assessment - 07/26/20 0909    Subjective Neck feels pretty good now. Stretched her neck out which helped. Back is pretty good. Pulls on her when she  walks. No neck pain currently. 2/10 low back pain when walking from waiting room to treatment room (about 50 ft)    Pertinent History Cervicalgia, lumbago with sciatica. Neck is currently bothering her the most. Neck pain began about 3 years ago. Sitting and doing nothing, pt is fine. However when pt performs chores such as cooking and cleaning or yardwork, or walking a while. Gradual onset of pain. Heating pad used to help. Stopped using heating pad because she uses aspercreme and she's not supposed to put a heating pad on it. Also has a pacemaker since 5 years ago.  Had x-ray for back and was told that her spine was crooked. Pt was also told that she might have had a fracture in her low back, also has arthritis, degenerative discs, spurring in her spine.  Low back pain has been bothering her on and off for the past 5-6 years. Denies loss of bowel or bladder control or LE paresthesia.  Sudden onset of back pain, unknown method of injury.  No falls within the past 6 months. No fear of falling.    Patient Stated Goals Walk, perform chores, cook more comfortably.    Currently in Pain? Yes    Pain Score 2  PT Education - 07/26/20 0945    Education Details ther-ex, HEP    Person(s) Educated Patient    Methods Explanation;Demonstration;Tactile cues;Verbal cues;Handout    Comprehension Verbalized understanding;Returned demonstration           MedbridgeAccess Code [email or web address]  Blood pressure is controlled per pt No latex band allergies  Cervical symptoms along C5 dermatome proximally  PACEMAKER  Observation: pt tendency to protract neck.   Movementdirectionpreference for low back: R side bend     Therapeutic Exercise Seated L lateral shift isometrics for low back in neutral to counter R lateral shift posture of low back 10x3 with 5 second holds  standing R lateral shift corecetion 10x3 with 5 second holds  Self seated  manually resisted L cervical lateral shift isometrics to counter R lateral shift posture of cervical spine  10x5 seconds for 3 sets  Seated L cervical rotation with neutral neck 10x  Supine chin tucks with towel roll behind neck 10x5 seconds   Slight pulling behind shoulders. Eases with rest  Supine open books 10x5 seconds for 2 sets to promote upper thoracic extension. Slight B UE pulling, eases with rest.  hooklying clamshell red band 10x5 seconds  Gait with transversus abdominis and glute max contraction  Back pain decreased from 1/10 to 0/10 during gait.    Improved exercise technique, movement at target joints, use of target muscles aftermin tomod verbal, visual, tactile cues.   Response to treatment/ clinical impression Decreased neck pain with treatment to promote neutral neck posture as well as to promote upper thoracic extension to decrease lower cervical extension stress. Decreased low back pain during gait with activation of transversus abdominis and glute max muscles to decrease stress to low back. Pt will benefit from continued skilled physical therapy services to decrease pain, improve strength and function.       PT Short Term Goals - 07/25/20 1512      PT SHORT TERM GOAL #1   Title Patient will be independent with her HEP to decrease pain, improve cervical and lumbar AROM, improve strength and function.    Baseline Pt has started her HEP. (06/15/2020); Pt performing HEP, no questions (07/25/2020)    Time 3    Period Weeks    Status Achieved    Target Date 07/07/20             PT Long Term Goals - 07/25/20 1439      PT LONG TERM GOAL #1   Title Patient will have a decrease in neck pain to 5/10 or less at worst to promote ability to perform chores as well as ambulate with less difficulty.    Baseline 10/10 at worst for the past 3 months (06/15/2020); 7/10 at most for the past 7 days (07/25/2020)    Time 8    Period Weeks    Status Partially Met    Target  Date 08/11/20      PT LONG TERM GOAL #2   Title Patient will have a decrease in low back pain to 2/10 or less at most to promote ability to peform chores, as well as ambulate more comfortably.    Baseline 5/10 low back pain at most for the past 3 months (06/15/2020); 5/10 at most for the past 7 days (07/25/2020)    Time 8    Period Weeks    Status On-going    Target Date 08/11/20      PT LONG TERM GOAL #3   Title Patient will improve  bilateral hip extension and abduction strength by at least 1/2 MMT grade to promote ability to ambulate and perform chores with less back pain.    Time 8    Period Weeks    Status Achieved    Target Date 08/11/20      PT LONG TERM GOAL #4   Title Patient will improve her lumbar spine FOTO by at least 10 points as a demonstration of improved function.    Baseline Lumbar spine FOTO: 49 (06/15/2020); 44 (07/20/2020)    Time 8    Period Weeks    Status On-going    Target Date 08/11/20                 Plan - 07/26/20 0946    Clinical Impression Statement Decreased neck pain with treatment to promote neutral neck posture as well as to promote upper thoracic extension to decrease lower cervical extension stress. Decreased low back pain during gait with activation of transversus abdominis and glute max muscles to decrease stress to low back. Pt will benefit from continued skilled physical therapy services to decrease pain, improve strength and function.    Personal Factors and Comorbidities Comorbidity 3+;Age;Fitness;Past/Current Experience;Time since onset of injury/illness/exacerbation    Comorbidities Pacemaker, HTN, dysrhythmia    Examination-Activity Limitations Bed Mobility;Dressing;Transfers;Bend;Stairs;Carry    Stability/Clinical Decision Making Stable/Uncomplicated    Rehab Potential Fair    PT Frequency 2x / week    PT Duration 8 weeks    PT Treatment/Interventions Neuromuscular re-education;Therapeutic activities;Therapeutic exercise;Functional  mobility training;Patient/family education;Manual techniques;Dry needling;Aquatic Therapy;Electrical Stimulation;Iontophoresis 66m/ml Dexamethasone;Traction;Gait training;Stair training;Balance training    PT Next Visit Plan thoracic extension, scapular, trunk, hip strengthening, posture, manual techniques, modalities PRN    PT Home Exercise Plan Medbridge Access Code [email or web address]   Consulted and Agree with Plan of Care Patient           Patient will benefit from skilled therapeutic intervention in order to improve the following deficits and impairments:  Pain, Postural dysfunction, Improper body mechanics, Difficulty walking, Decreased strength, Abnormal gait  Visit Diagnosis: Cervicalgia  Muscle weakness (generalized)  Chronic bilateral low back pain, unspecified whether sciatica present  Difficulty in walking, not elsewhere classified     Problem List Patient Active Problem List   Diagnosis Date Noted  . Acute hyponatremia 06/26/2019  . Generalized weakness 12/07/2018  . Neck pain 05/13/2017  . [place] 06/22/2015  . PAF (paroxysmal atrial fibrillation) [place] 06/11/2014  . URI (upper respiratory infection) 10/21/2013  . Mixed hyperlipidemia 02/06/2012    MJoneen BoersPT, DPT   07/26/2020, 10:03 AM  [facility] [address] [place] [address]Phone: [phone]  Fax:  [phone] Name: Hailey KALLSTROMMRN: [id number] of Birth: [birth year]

## 2020-07-26 NOTE — Patient Instructions (Signed)
Access Code: [email or web address] URL: [email or web address] Date: 07/26/2020 Prepared by: Joneen Boers  Exercises Seated Scapular Retraction - 5 x daily - 7 x weekly - 3 sets - 10 reps - 5 seconds hold Seated Cervical Retraction - 3 x daily - 7 x weekly - 3 sets - 10 reps - 5 seconds hold Supine Deep Neck Flexor Nods - 1 x daily - 7 x weekly - 3 sets - 10 reps - 5 seconds hold Supine Posterior Pelvic Tilt - 1 x daily - 7 x weekly - 3 sets - 10 reps - 5 seconds hold Hooklying Clamshell with Resistance - 1 x daily - 7 x weekly - 3 sets - 10 reps Seated Isometric Cervical Sidebending - 1 x daily - 7 x weekly - 3 sets - 10 reps - 5 hold Sidelying Hip Abduction - 1 x daily - 7 x weekly - 3 sets - 10 reps Hooklying Shoulder T - 1 x daily - 7 x weekly - 3 sets - 10 reps - 5 seconds hold

## 2020-08-01 ENCOUNTER — Other Ambulatory Visit: Payer: Self-pay

## 2020-08-01 ENCOUNTER — Ambulatory Visit: Payer: PPO

## 2020-08-01 DIAGNOSIS — M542 Cervicalgia: Secondary | ICD-10-CM | POA: Diagnosis not present

## 2020-08-01 DIAGNOSIS — M6281 Muscle weakness (generalized): Secondary | ICD-10-CM

## 2020-08-01 DIAGNOSIS — G8929 Other chronic pain: Secondary | ICD-10-CM

## 2020-08-01 DIAGNOSIS — R262 Difficulty in walking, not elsewhere classified: Secondary | ICD-10-CM

## 2020-08-01 DIAGNOSIS — M545 Low back pain, unspecified: Secondary | ICD-10-CM

## 2020-08-01 NOTE — Therapy (Signed)
[place] [facility] [address], [place], [address] Phone: [phone]   Fax:  [phone]  Physical Therapy Treatment  Patient Details  Name: Hailey Simmons MRN: [id number] Date of Birth: [birth year] Referring Provider (PT): Kary Kos, MD   Encounter Date: 08/01/2020   PT End of Session - 08/01/20 1119    Visit Number 12    Number of Visits 17    Date for PT Re-Evaluation 08/11/20    Authorization Type 2    Authorization Time Period of 10 progress report    PT Start Time 1119    PT Stop Time 1202    PT Time Calculation (min) 43 min    Activity Tolerance Patient tolerated treatment well    Behavior During Therapy [facility] for tasks assessed/performed           Past Medical History:  Diagnosis Date  . Asthma 1970's - 1990's   a. ? 2/2 wood stove usage.  . Basal cell carcinoma of skin of nose   . Diverticulosis   . Dysrhythmia    A-FIB  . Goiter   . History of chicken pox   . Hx of colonic polyps   . Hypertension   . Hypothyroidism   . Menopause   . PAF (paroxysmal atrial fibrillation) ([place])    on apixoban  . Sinus node dysfunction (HCC)    a. s/p MDT dual chamber pacemaker  . Small bowel obstruction ([place]) 08/2009    Past Surgical History:  Procedure Laterality Date  . CATARACT EXTRACTION  02/2019  . CATARACT EXTRACTION  04/2019  . COLONOSCOPY    . COLONOSCOPY WITH PROPOFOL N/A 12/02/2018   Procedure: COLONOSCOPY WITH PROPOFOL;  Surgeon: Lollie Sails, MD;  Location: [facility] ENDOSCOPY;  Service: Endoscopy;  Laterality: N/A;  . EP IMPLANTABLE DEVICE N/A 06/22/2015   MDT MRI compatible dual chamber PPM implanted by Dr Caryl Comes for symptomatic bradycardia  . INSERT / REPLACE / REMOVE PACEMAKER      There were no vitals filed for this visit.   Subjective Assessment - 08/01/20 1120    Subjective Went to see her spine doctor who said that he can see some improvement.  Neck feels petty good right now,  about a 2/10. Did good since wednesday. It was better than usual. Back bothered her a little bit with walking but not like it has been in times past. 2/10 low back pain during gait from waiting room to treatment room.    Pertinent History Cervicalgia, lumbago with sciatica. Neck is currently bothering her the most. Neck pain began about 3 years ago. Sitting and doing nothing, pt is fine. However when pt performs chores such as cooking and cleaning or yardwork, or walking a while. Gradual onset of pain. Heating pad used to help. Stopped using heating pad because she uses aspercreme and she's not supposed to put a heating pad on it. Also has a pacemaker since 5 years ago.  Had x-ray for back and was told that her spine was crooked. Pt was also told that she might have had a fracture in her low back, also has arthritis, degenerative discs, spurring in her spine.  Low back pain has been bothering her on and off for the past 5-6 years. Denies loss of bowel or bladder control or LE paresthesia.  Sudden onset of back pain, unknown method of injury.  No falls within the past 6 months. No fear of falling.    Patient  Stated Goals Walk, perform chores, cook more comfortably.    Currently in Pain? Yes    Pain Score 2                                      PT Education - 08/01/20 1147    Education Details ther-ex    Person(s) Educated Patient    Methods Explanation;Demonstration;Tactile cues;Verbal cues    Comprehension Returned demonstration;Verbalized understanding          MedbridgeAccess Code [email or web address]  Blood pressure is controlled per pt No latex band allergies  Cervical symptoms along C5 dermatome proximally  PACEMAKER  Observation: pt tendency to protract neck.   Movementdirectionpreference for low back: R side bend     Therapeutic Exercise  Seated L lateral shift isometrics for low back in neutral to counter R lateral shift posture of low back 10x3  with 5 second holds  Seated manually resisted trunk flexion isometrics in neutral, PT manual resistance. 10x5 seconds. Low back discomfort.   hooklying posterior pelvic tilt 10x5 seconds for 2 sets  hooklying B shoulder flexion to promote upper thoracic extension and decrease lower cervical and low back stress. 10x3 with 5 second holds  hooklying manually resisted trunk rotation in neutral 10x5 seconds for 3 sets R and L   hooklying chin tucks with towel roll under neck 10x5 seconds for 3 sets  Log rolling for sit <> supine 1x. No neck pulling  SLS with B UE assist, emphasis on level pelvis to promote glute med strengthening    R 10x5 seconds  L 10x5 seconds   Improved exercise technique, movement at target joints, use of target muscles aftermin tomod verbal, visual, tactile cues.   Response to treatment/ clinical impression Continued working on improving core strength, thoracic extension and cervical muscle strength to decrease stress to low back and lower cervical spine. Decreased neck and low back pain to 1/10 after session. Pt will benefit from continued skilled physical therapy services to decrease pain, improve strength and function.      PT Short Term Goals - 07/25/20 1512      PT SHORT TERM GOAL #1   Title Patient will be independent with her HEP to decrease pain, improve cervical and lumbar AROM, improve strength and function.    Baseline Pt has started her HEP. (06/15/2020); Pt performing HEP, no questions (07/25/2020)    Time 3    Period Weeks    Status Achieved    Target Date 07/07/20             PT Long Term Goals - 07/25/20 1439      PT LONG TERM GOAL #1   Title Patient will have a decrease in neck pain to 5/10 or less at worst to promote ability to perform chores as well as ambulate with less difficulty.    Baseline 10/10 at worst for the past 3 months (06/15/2020); 7/10 at most for the past 7 days (07/25/2020)    Time 8    Period Weeks    Status  Partially Met    Target Date 08/11/20      PT LONG TERM GOAL #2   Title Patient will have a decrease in low back pain to 2/10 or less at most to promote ability to peform chores, as well as ambulate more comfortably.    Baseline 5/10 low back pain at most for the past 3  months (06/15/2020); 5/10 at most for the past 7 days (07/25/2020)    Time 8    Period Weeks    Status On-going    Target Date 08/11/20      PT LONG TERM GOAL #3   Title Patient will improve bilateral hip extension and abduction strength by at least 1/2 MMT grade to promote ability to ambulate and perform chores with less back pain.    Time 8    Period Weeks    Status Achieved    Target Date 08/11/20      PT LONG TERM GOAL #4   Title Patient will improve her lumbar spine FOTO by at least 10 points as a demonstration of improved function.    Baseline Lumbar spine FOTO: 49 (06/15/2020); 44 (07/20/2020)    Time 8    Period Weeks    Status On-going    Target Date 08/11/20                 Plan - 08/01/20 1154    Clinical Impression Statement Continued working on improving core strength, thoracic extension and cervical muscle strength to decrease stress to low back and lower cervical spine. Decreased neck and low back pain to 1/10 after session. Pt will benefit from continued skilled physical therapy services to decrease pain, improve strength and function.    Personal Factors and Comorbidities Comorbidity 3+;Age;Fitness;Past/Current Experience;Time since onset of injury/illness/exacerbation    Comorbidities Pacemaker, HTN, dysrhythmia    Examination-Activity Limitations Bed Mobility;Dressing;Transfers;Bend;Stairs;Carry    Stability/Clinical Decision Making Stable/Uncomplicated    Rehab Potential Fair    PT Frequency 2x / week    PT Duration 8 weeks    PT Treatment/Interventions Neuromuscular re-education;Therapeutic activities;Therapeutic exercise;Functional mobility training;Patient/family education;Manual  techniques;Dry needling;Aquatic Therapy;Electrical Stimulation;Iontophoresis 2m/ml Dexamethasone;Traction;Gait training;Stair training;Balance training    PT Next Visit Plan thoracic extension, scapular, trunk, hip strengthening, posture, manual techniques, modalities PRN    PT Home Exercise Plan Medbridge Access Code [email or web address]   Consulted and Agree with Plan of Care Patient           Patient will benefit from skilled therapeutic intervention in order to improve the following deficits and impairments:  Pain, Postural dysfunction, Improper body mechanics, Difficulty walking, Decreased strength, Abnormal gait  Visit Diagnosis: Cervicalgia  Muscle weakness (generalized)  Chronic bilateral low back pain, unspecified whether sciatica present  Difficulty in walking, not elsewhere classified     Problem List Patient Active Problem List   Diagnosis Date Noted  . Acute hyponatremia 06/26/2019  . Generalized weakness 12/07/2018  . Neck pain 05/13/2017  . [place]us [place] dysfunction ([place] 06/22/2015  . PAF (paroxysmal atrial fibrillation) ([place] 06/11/2014  . URI (upper respiratory infection) 10/21/2013  . Mixed hyperlipidemia 02/06/2012    MJoneen BoersPT, DPT   08/01/2020, 12:15 PM  [facility] [address] [place] [address]Phone: [phone]  Fax:  [phone] Name: Hailey GUNNELSMRN: [id number] of Birth: [birth year]

## 2020-08-03 ENCOUNTER — Ambulatory Visit: Payer: PPO | Attending: Neurosurgery

## 2020-08-03 ENCOUNTER — Other Ambulatory Visit: Payer: Self-pay

## 2020-08-03 DIAGNOSIS — I10 Essential (primary) hypertension: Secondary | ICD-10-CM | POA: Diagnosis not present

## 2020-08-03 DIAGNOSIS — M545 Low back pain, unspecified: Secondary | ICD-10-CM

## 2020-08-03 DIAGNOSIS — G8929 Other chronic pain: Secondary | ICD-10-CM | POA: Insufficient documentation

## 2020-08-03 DIAGNOSIS — M6281 Muscle weakness (generalized): Secondary | ICD-10-CM

## 2020-08-03 DIAGNOSIS — R262 Difficulty in walking, not elsewhere classified: Secondary | ICD-10-CM | POA: Insufficient documentation

## 2020-08-03 DIAGNOSIS — M542 Cervicalgia: Secondary | ICD-10-CM | POA: Diagnosis not present

## 2020-08-03 DIAGNOSIS — E782 Mixed hyperlipidemia: Secondary | ICD-10-CM | POA: Diagnosis not present

## 2020-08-03 DIAGNOSIS — I480 Paroxysmal atrial fibrillation: Secondary | ICD-10-CM | POA: Diagnosis not present

## 2020-08-03 NOTE — Patient Instructions (Signed)
Access Code: [email or web address] URL: [email or web address] Date: 08/03/2020 Prepared by: Joneen Boers  Exercises Seated Scapular Retraction - 5 x daily - 7 x weekly - 3 sets - 10 reps - 5 seconds hold Seated Cervical Retraction - 3 x daily - 7 x weekly - 3 sets - 10 reps - 5 seconds hold Supine Deep Neck Flexor Nods - 1 x daily - 7 x weekly - 3 sets - 10 reps - 5 seconds hold Supine Posterior Pelvic Tilt - 1 x daily - 7 x weekly - 3 sets - 10 reps - 5 seconds hold Hooklying Clamshell with Resistance - 1 x daily - 7 x weekly - 3 sets - 10 reps Seated Isometric Cervical Sidebending - 1 x daily - 7 x weekly - 3 sets - 10 reps - 5 hold Sidelying Hip Abduction - 1 x daily - 7 x weekly - 3 sets - 10 reps Hooklying Shoulder T - 1 x daily - 7 x weekly - 3 sets - 10 reps - 5 seconds hold Seated Cervical Sidebending AROM - 3 x daily - 7 x weekly - 2 sets - 3 reps - 30 seconds hold

## 2020-08-03 NOTE — Therapy (Signed)
[facility] [address], [place], [address] Phone: [phone]   Fax:  [phone]  Physical Therapy Treatment  Patient Details  Name: Hailey Simmons MRN: [id number] Date of Birth: 13-Aug-1936 Referring Provider (PT): Kary Kos, MD   Encounter Date: 08/03/2020   PT End of Session - 08/03/20 1350    Visit Number 13    Number of Visits 17    Date for PT Re-Evaluation 08/11/20    Authorization Type 3    Authorization Time Period of 10 progress report    PT Start Time 1350    PT Stop Time 1431    PT Time Calculation (min) 41 min    Activity Tolerance Patient tolerated treatment well    Behavior During Therapy [facility] for tasks assessed/performed           Past Medical History:  Diagnosis Date  . Asthma 1970's - 1990's   a. ? 2/2 wood stove usage.  . Basal cell carcinoma of skin of nose   . Diverticulosis   . Dysrhythmia    A-FIB  . Goiter   . History of chicken pox   . Hx of colonic polyps   . Hypertension   . Hypothyroidism   . Menopause   . PAF (paroxysmal atrial fibrillation) ([place])    on apixoban  . Sinus node dysfunction (HCC)    a. s/p MDT dual chamber pacemaker  . Small bowel obstruction ([place]) 08/2009    Past Surgical History:  Procedure Laterality Date  . CATARACT EXTRACTION  02/2019  . CATARACT EXTRACTION  04/2019  . COLONOSCOPY    . COLONOSCOPY WITH PROPOFOL N/A 12/02/2018   Procedure: COLONOSCOPY WITH PROPOFOL;  Surgeon: Lollie Sails, MD;  Location: [facility] ENDOSCOPY;  Service: Endoscopy;  Laterality: N/A;  . EP IMPLANTABLE DEVICE N/A 06/22/2015   MDT MRI compatible dual chamber PPM implanted by Dr Caryl Comes for symptomatic bradycardia  . INSERT / REPLACE / REMOVE PACEMAKER      There were no vitals filed for this visit.   Subjective Assessment - 08/03/20 1352    Subjective Doing pretty good. Had back pulling and neck pulling yesterday. However, yesterday evening got better and is  better this morning. Did a lot of shopping yesterday morning. 2/10 neck pain with cervical rotation (no pain at rest), 1/10 low back pain while sitting, walking from waiting room to treatment room, low back pain is a 2/10.  However she is better than she used to be. Better able to look up and turn her head.  Better able to shop at the grocery store since starting PT.    Pertinent History Cervicalgia, lumbago with sciatica. Neck is currently bothering her the most. Neck pain began about 3 years ago. Sitting and doing nothing, pt is fine. However when pt performs chores such as cooking and cleaning or yardwork, or walking a while. Gradual onset of pain. Heating pad used to help. Stopped using heating pad because she uses aspercreme and she's not supposed to put a heating pad on it. Also has a pacemaker since 5 years ago.  Had x-ray for back and was told that her spine was crooked. Pt was also told that she might have had a fracture in her low back, also has arthritis, degenerative discs, spurring in her spine.  Low back pain has been bothering her on and off for the past 5-6 years. Denies loss of bowel or bladder control or LE paresthesia.  Sudden onset of back pain, unknown method of injury.  No falls within the past 6 months. No fear of falling.    Patient Stated Goals Walk, perform chores, cook more comfortably.    Currently in Pain? Yes    Pain Score 2                                      PT Education - 08/03/20 1540    Education Details ther-ex. HEP    Person(s) Educated Patient    Methods Explanation;Demonstration;Tactile cues;Verbal cues    Comprehension Returned demonstration;Verbalized understanding           MedbridgeAccess Code [email or web address]  Blood pressure is controlled per pt No latex band allergies  Cervical symptoms along C5 dermatome proximally  PACEMAKER  Observation: pt tendency to protract neck.   Movementdirectionpreference for low back: R  side bend   Therapeutic Exercise  Seated L lateral shift isometrics for low back in neutral to counter R lateral shift posture of low back 10x3 with 5 second holds  Seated manually resisted L trunk rotation to promote R lower lumbar rotation 10x with 5 second holds   Standing B shoulder extension with scapular retraction yellow band 10x5 second holds for 2 sets  Standing R lumbar latreral shift correction 10x5 seconds for 2 sets   No low back pain with gait after aforementioned exercises to promote neutral lumbar posture.   Seated self manually resisted L cervical lateral shift isometrics 10x5 seconds   Seated L first rib stretch with PT manual stabilization to L first rib 15 seconds x 5  Slight decrease in pain/tightness   R cervical side bend stretch 30 seconds x 3 to stretch L side of the neck  Decreased neck pain    Seated manually resisted L scapular retraction targeting the lower trap muscles 10x5 seconds for 3 sets  Decreased L scalene muscle tension palpated.   Decreased L lateral neck pain with cervical rotation.   Improved exercise technique, movement at target joints, use of target muscles aftermin tomod verbal, visual, tactile cues.   Response to treatment/ clinical impression Decreased low back pain with treatment to decrease R lumbar lateral shift posture. Decreased L lateral neck pain with treatment to decrease L scalene muscle tension and improve scapular strength. Pt tolerated session well without aggravation of symptoms. Pt making progress with function based on subjective reports. Pt will benefit from continued skilled physical therapy services to decrease pain, improve strength and function.     PT Short Term Goals - 07/25/20 1512      PT SHORT TERM GOAL #1   Title Patient will be independent with her HEP to decrease pain, improve cervical and lumbar AROM, improve strength and function.    Baseline Pt has started her HEP. (06/15/2020); Pt  performing HEP, no questions (07/25/2020)    Time 3    Period Weeks    Status Achieved    Target Date 07/07/20             PT Long Term Goals - 07/25/20 1439      PT LONG TERM GOAL #1   Title Patient will have a decrease in neck pain to 5/10 or less at worst to promote ability to perform chores as well as ambulate with less difficulty.    Baseline 10/10 at worst for the past 3 months (06/15/2020); 7/10 at most for the past  7 days (07/25/2020)    Time 8    Period Weeks    Status Partially Met    Target Date 08/11/20      PT LONG TERM GOAL #2   Title Patient will have a decrease in low back pain to 2/10 or less at most to promote ability to peform chores, as well as ambulate more comfortably.    Baseline 5/10 low back pain at most for the past 3 months (06/15/2020); 5/10 at most for the past 7 days (07/25/2020)    Time 8    Period Weeks    Status On-going    Target Date 08/11/20      PT LONG TERM GOAL #3   Title Patient will improve bilateral hip extension and abduction strength by at least 1/2 MMT grade to promote ability to ambulate and perform chores with less back pain.    Time 8    Period Weeks    Status Achieved    Target Date 08/11/20      PT LONG TERM GOAL #4   Title Patient will improve her lumbar spine FOTO by at least 10 points as a demonstration of improved function.    Baseline Lumbar spine FOTO: 49 (06/15/2020); 44 (07/20/2020)    Time 8    Period Weeks    Status On-going    Target Date 08/11/20                 Plan - 08/03/20 1541    Clinical Impression Statement Decreased low back pain with treatment to decrease R lumbar lateral shift posture. Decreased L lateral neck pain with treatment to decrease L scalene muscle tension and improve scapular strength. Pt tolerated session well without aggravation of symptoms. Pt making progress with function based on subjective reports. Pt will benefit from continued skilled physical therapy services to decrease pain,  improve strength and function.    Personal Factors and Comorbidities Comorbidity 3+;Age;Fitness;Past/Current Experience;Time since onset of injury/illness/exacerbation    Comorbidities Pacemaker, HTN, dysrhythmia    Examination-Activity Limitations Bed Mobility;Dressing;Transfers;Bend;Stairs;Carry    Stability/Clinical Decision Making Stable/Uncomplicated    Rehab Potential Fair    PT Frequency 2x / week    PT Duration 8 weeks    PT Treatment/Interventions Neuromuscular re-education;Therapeutic activities;Therapeutic exercise;Functional mobility training;Patient/family education;Manual techniques;Dry needling;Aquatic Therapy;Electrical Stimulation;Iontophoresis 24m/ml Dexamethasone;Traction;Gait training;Stair training;Balance training    PT Next Visit Plan thoracic extension, scapular, trunk, hip strengthening, posture, manual techniques, modalities PRN    PT Home Exercise Plan Medbridge Access Code [email or web address]   Consulted and Agree with Plan of Care Patient           Patient will benefit from skilled therapeutic intervention in order to improve the following deficits and impairments:  Pain, Postural dysfunction, Improper body mechanics, Difficulty walking, Decreased strength, Abnormal gait  Visit Diagnosis: Cervicalgia  Muscle weakness (generalized)  Chronic bilateral low back pain, unspecified whether sciatica present  Difficulty in walking, not elsewhere classified     Problem List Patient Active Problem List   Diagnosis Date Noted  . Acute hyponatremia 06/26/2019  . Generalized weakness 12/07/2018  . Neck pain 05/13/2017  . [place]us [place] dysfunction ([place] 06/22/2015  . PAF (paroxysmal atrial fibrillation) ([place] 06/11/2014  . URI (upper respiratory infection) 10/21/2013  . Mixed hyperlipidemia 02/06/2012    MJoneen BoersPT, DPT   08/03/2020, 3:47 PM   [facility] [address] [place]  [address]Phone: [phone]  Fax:  [phone]  Name: NORRIS BODLEY MRN: [id number] Date of Birth: 1936-08-06

## 2020-08-09 ENCOUNTER — Ambulatory Visit: Payer: PPO

## 2020-08-09 ENCOUNTER — Other Ambulatory Visit: Payer: Self-pay

## 2020-08-09 DIAGNOSIS — M6281 Muscle weakness (generalized): Secondary | ICD-10-CM

## 2020-08-09 DIAGNOSIS — R262 Difficulty in walking, not elsewhere classified: Secondary | ICD-10-CM

## 2020-08-09 DIAGNOSIS — M542 Cervicalgia: Secondary | ICD-10-CM | POA: Diagnosis not present

## 2020-08-09 DIAGNOSIS — G8929 Other chronic pain: Secondary | ICD-10-CM

## 2020-08-09 NOTE — Therapy (Signed)
[facility] [address] [place], [address] Phone: [phone]   Fax:  [phone]  Physical Therapy Treatment  Patient Details  Name: Hailey Simmons MRN: [id number] Date of Birth: [birth year] Referring Provider (PT): Kary Kos, MD   Encounter Date: 08/09/2020   PT End of Session - 08/09/20 0947    Visit Number 14    Number of Visits 17    Date for PT Re-Evaluation 08/11/20    Authorization Type 4    Authorization Time Period of 10 progress report    PT Start Time 0947    PT Stop Time 1029    PT Time Calculation (min) 42 min    Activity Tolerance Patient tolerated treatment well    Behavior During Therapy [facility] for tasks assessed/performed           Past Medical History:  Diagnosis Date  . Asthma 1970's - 1990's   a. ? 2/2 wood stove usage.  . Basal cell carcinoma of skin of nose   . Diverticulosis   . Dysrhythmia    A-FIB  . Goiter   . History of chicken pox   . Hx of colonic polyps   . Hypertension   . Hypothyroidism   . Menopause   . PAF (paroxysmal atrial fibrillation) ([place])    on apixoban  . Sinus node dysfunction (HCC)    a. s/p MDT dual chamber pacemaker  . Small bowel obstruction ([place]) 08/2009    Past Surgical History:  Procedure Laterality Date  . CATARACT EXTRACTION  02/2019  . CATARACT EXTRACTION  04/2019  . COLONOSCOPY    . COLONOSCOPY WITH PROPOFOL N/A 12/02/2018   Procedure: COLONOSCOPY WITH PROPOFOL;  Surgeon: Lollie Sails, MD;  Location: [facility] ENDOSCOPY;  Service: Endoscopy;  Laterality: N/A;  . EP IMPLANTABLE DEVICE N/A 06/22/2015   MDT MRI compatible dual chamber PPM implanted by Dr Caryl Comes for symptomatic bradycardia  . INSERT / REPLACE / REMOVE PACEMAKER      There were no vitals filed for this visit.   Subjective Assessment - 08/09/20 0948    Subjective Neck and back are a little bit better. Better able to look up. 2/10 neck pain with cervical rotation, 1/10  looking up. No neck pain at rest. 1/10 low back pain when walking. 4/10 neck and back at most for the past 7 days.  The side bend stretch for the neck helps the best.  Pt states PT is helping. Better able to wash dishes without her neck bothering her as much.    Pertinent History Cervicalgia, lumbago with sciatica. Neck is currently bothering her the most. Neck pain began about 3 years ago. Sitting and doing nothing, pt is fine. However when pt performs chores such as cooking and cleaning or yardwork, or walking a while. Gradual onset of pain. Heating pad used to help. Stopped using heating pad because she uses aspercreme and she's not supposed to put a heating pad on it. Also has a pacemaker since 5 years ago.  Had x-ray for back and was told that her spine was crooked. Pt was also told that she might have had a fracture in her low back, also has arthritis, degenerative discs, spurring in her spine.  Low back pain has been bothering her on and off for the past 5-6 years. Denies loss of bowel or bladder control or LE paresthesia.  Sudden onset of back pain, unknown method of injury.  No falls within the  past 6 months. No fear of falling.    Patient Stated Goals Walk, perform chores, cook more comfortably.    Currently in Pain? Yes    Pain Score 2     Pain Location Neck                                     PT Education - 08/09/20 1010    Education Details ther-ex    Person(s) Educated Patient    Methods Explanation;Demonstration;Tactile cues;Verbal cues    Comprehension Returned demonstration;Verbalized understanding             MedbridgeAccess Code [email or web address]  Blood pressure is controlled per pt No latex band allergies  Cervical symptoms along C5 dermatome proximally  PACEMAKER  Observation: pt tendency to protract neck.   Movementdirectionpreference for low back: R side bend     Manual therapy Seated STM L distal scalene and L upper trap and L  levator scapula muscles  Improved comfort level with cervical rotation    Therapeutic Exercise  PT manual caudal pressure to L first rib  Cervical rotaiton 10x3 each   Improved cervical comfort with rotation  Seated L lateral shift isometrics for low back in neutral to counter R lateral shift posture of low back 10x3 with 5 second holds  Seated manually resisted L trunk rotation to promote R lower lumbar rotation 10x2 with 5 second holds    Standing R lumbar latreral shift correction 10x5 seconds for 2 sets  SLS with B UE assist to promote glute med muscle strengthening to decrease lateral lean during gait.   R 10x5 seconds for 2 sets  L 10x5 seconds   Improved exercise technique, movement at target joints, use of target muscles aftermin tomod verbal, visual, tactile cues.   Response to treatment/ clinical impression Improving overall function and ability to look around based on subjective reports. Continued working on decreasing L peri-cervical and scapular muscle tension, improving low back posture and improving glute strength. Improved comfort level with cervical rotation reported. Pt will benefit from continued skilled physical therapy services to decrease pain, improve strength and function.      PT Short Term Goals - 07/25/20 1512      PT SHORT TERM GOAL #1   Title Patient will be independent with her HEP to decrease pain, improve cervical and lumbar AROM, improve strength and function.    Baseline Pt has started her HEP. (06/15/2020); Pt performing HEP, no questions (07/25/2020)    Time 3    Period Weeks    Status Achieved    Target Date 07/07/20             PT Long Term Goals - 07/25/20 1439      PT LONG TERM GOAL #1   Title Patient will have a decrease in neck pain to 5/10 or less at worst to promote ability to perform chores as well as ambulate with less difficulty.    Baseline 10/10 at worst for the past 3 months (06/15/2020); 7/10 at most for the  past 7 days (07/25/2020)    Time 8    Period Weeks    Status Partially Met    Target Date 08/11/20      PT LONG TERM GOAL #2   Title Patient will have a decrease in low back pain to 2/10 or less at most to promote ability to peform chores, as well as ambulate  more comfortably.    Baseline 5/10 low back pain at most for the past 3 months (06/15/2020); 5/10 at most for the past 7 days (07/25/2020)    Time 8    Period Weeks    Status On-going    Target Date 08/11/20      PT LONG TERM GOAL #3   Title Patient will improve bilateral hip extension and abduction strength by at least 1/2 MMT grade to promote ability to ambulate and perform chores with less back pain.    Time 8    Period Weeks    Status Achieved    Target Date 08/11/20      PT LONG TERM GOAL #4   Title Patient will improve her lumbar spine FOTO by at least 10 points as a demonstration of improved function.    Baseline Lumbar spine FOTO: 49 (06/15/2020); 44 (07/20/2020)    Time 8    Period Weeks    Status On-going    Target Date 08/11/20                 Plan - 08/09/20 0945    Clinical Impression Statement Improving overall function and ability to look around based on subjective reports. Continued working on decreasing L peri-cervical and scapular muscle tension, improving low back posture and improving glute strength. Improved comfort level with cervical rotation reported. Pt will benefit from continued skilled physical therapy services to decrease pain, improve strength and function.    Personal Factors and Comorbidities Comorbidity 3+;Age;Fitness;Past/Current Experience;Time since onset of injury/illness/exacerbation    Comorbidities Pacemaker, HTN, dysrhythmia    Examination-Activity Limitations Bed Mobility;Dressing;Transfers;Bend;Stairs;Carry    Stability/Clinical Decision Making Stable/Uncomplicated    Rehab Potential Fair    PT Frequency 2x / week    PT Duration 8 weeks    PT Treatment/Interventions  Neuromuscular re-education;Therapeutic activities;Therapeutic exercise;Functional mobility training;Patient/family education;Manual techniques;Dry needling;Aquatic Therapy;Electrical Stimulation;Iontophoresis 46m/ml Dexamethasone;Traction;Gait training;Stair training;Balance training    PT Next Visit Plan thoracic extension, scapular, trunk, hip strengthening, posture, manual techniques, modalities PRN    PT Home Exercise Plan Medbridge Access Code [email or web address]   Consulted and Agree with Plan of Care Patient           Patient will benefit from skilled therapeutic intervention in order to improve the following deficits and impairments:  Pain, Postural dysfunction, Improper body mechanics, Difficulty walking, Decreased strength, Abnormal gait  Visit Diagnosis: Cervicalgia  Muscle weakness (generalized)  Chronic bilateral low back pain, unspecified whether sciatica present  Difficulty in walking, not elsewhere classified     Problem List Patient Active Problem List   Diagnosis Date Noted  . Acute hyponatremia 06/26/2019  . Generalized weakness 12/07/2018  . Neck pain 05/13/2017  . [place] 06/22/2015  . PAF (paroxysmal atrial fibrillation) ([place] 06/11/2014  . URI (upper respiratory infection) 10/21/2013  . Mixed hyperlipidemia 02/06/2012    MJoneen BoersPT, DPT   08/09/2020, 11:46 AM  [facility] [address] [place] [address]Phone: [phone]  Fax:  [phone] Name: Hailey MUIMRN: [id number] of Birth: 123-Aug-1937

## 2020-08-11 ENCOUNTER — Other Ambulatory Visit: Payer: Self-pay

## 2020-08-11 ENCOUNTER — Ambulatory Visit: Payer: PPO

## 2020-08-11 DIAGNOSIS — M542 Cervicalgia: Secondary | ICD-10-CM | POA: Diagnosis not present

## 2020-08-11 DIAGNOSIS — M6281 Muscle weakness (generalized): Secondary | ICD-10-CM

## 2020-08-11 DIAGNOSIS — R262 Difficulty in walking, not elsewhere classified: Secondary | ICD-10-CM

## 2020-08-11 DIAGNOSIS — G8929 Other chronic pain: Secondary | ICD-10-CM

## 2020-08-11 NOTE — Therapy (Signed)
[facility] [address], [place], [address] Phone: [phone]   Fax:  [phone]  Physical Therapy Treatment/Reassessment/Recertification  Patient Details  Name: Hailey Simmons MRN: [id number] Date of Birth: 1936/08/15 Referring Provider (PT): Kary Kos, MD   Encounter Date: 08/11/2020   PT End of Session - 08/11/20 0950    Visit Number 1    Number of Visits 17    Date for PT Re-Evaluation 10/06/20    PT Start Time 0945    PT Stop Time 1025    PT Time Calculation (min) 40 min    Activity Tolerance Patient tolerated treatment well;No increased pain    Behavior During Therapy WFL for tasks assessed/performed           Past Medical History:  Diagnosis Date  . Asthma 1970's - 1990's   a. ? 2/2 wood stove usage.  . Basal cell carcinoma of skin of nose   . Diverticulosis   . Dysrhythmia    A-FIB  . Goiter   . History of chicken pox   . Hx of colonic polyps   . Hypertension   . Hypothyroidism   . Menopause   . PAF (paroxysmal atrial fibrillation) ([place])    on apixoban  . Sinus node dysfunction (HCC)    a. s/p MDT dual chamber pacemaker  . Small bowel obstruction ([place]) 08/2009    Past Surgical History:  Procedure Laterality Date  . CATARACT EXTRACTION  02/2019  . CATARACT EXTRACTION  04/2019  . COLONOSCOPY    . COLONOSCOPY WITH PROPOFOL N/A 12/02/2018   Procedure: COLONOSCOPY WITH PROPOFOL;  Surgeon: Lollie Sails, MD;  Location: [facility] ENDOSCOPY;  Service: Endoscopy;  Laterality: N/A;  . EP IMPLANTABLE DEVICE N/A 06/22/2015   MDT MRI compatible dual chamber PPM implanted by Dr Caryl Comes for symptomatic bradycardia  . INSERT / REPLACE / REMOVE PACEMAKER      There were no vitals filed for this visit.   Subjective Assessment - 08/11/20 0947    Subjective Pt reports her neck feeling pretty good this date. Yesterday had some increased pain, she believes because of a busy day. HEP conitnues to go  well.    Pertinent History Cervicalgia, lumbago with sciatica. Neck is currently bothering her the most. Neck pain began about 3 years ago. Sitting and doing nothing, pt is fine. However when pt performs chores such as cooking and cleaning or yardwork, or walking a while. Gradual onset of pain. Heating pad used to help. Stopped using heating pad because she uses aspercreme and she's not supposed to put a heating pad on it. Also has a pacemaker since 5 years ago.  Had x-ray for back and was told that her spine was crooked. Pt was also told that she might have had a fracture in her low back, also has arthritis, degenerative discs, spurring in her spine.  Low back pain has been bothering her on and off for the past 5-6 years. Denies loss of bowel or bladder control or LE paresthesia.  Sudden onset of back pain, unknown method of injury.  No falls within the past 6 months. No fear of falling.    Currently in Pain? Yes    Pain Score 3     Pain Location Neck    Pain Orientation Right;Left;Lateral    Pain Descriptors / Indicators --   pulls   Pain Type Chronic pain    Pain Radiating Towards Bila tupper traps at L5/6  level              [facility] PT Assessment - 08/11/20 0001      Assessment   Medical Diagnosis Lumbago, cervicalgia    Referring Provider (PT) Kary Kos, MD    Onset Date/Surgical Date 04/21/20   Date PT referral signed. Chronic condition   Hand Dominance Right    Prior Therapy Had chiropractic treatment for neck which did not make any lasting effect (had massage in her upper traps as well)      Precautions   Precautions ICD/Pacemaker      Balance Screen   Has the patient fallen in the past 6 months No    Has the patient had a decrease in activity level because of a fear of falling?  No    Is the patient reluctant to leave their home because of a fear of falling?  No      Observation/Other Assessments   Focus on Therapeutic Outcomes (FOTO)  52/100      Posture/Postural Control    Posture/Postural Control Postural limitations    Postural Limitations Forward head    Posture Comments moderate upper thoracic kyphosis (rigid)   lower thoracic and lumbar curvatures WNL     AROM   Cervical Flexion 2 fingers (chin-sternum)    Cervical Extension 15 degrees    Cervical - Right Side Bend 11   no pain, just pulling   Cervical - Left Side Bend 11   no pain just pulling   Cervical - Right Rotation 42   no pain just pulling   Cervical - Left Rotation 41   no pain just pulling     Strength   Right Shoulder Flexion 4+/5    Right Shoulder ABduction 5/5   posterior deltoid/horizontal ABDCT: 4/5   Left Shoulder Flexion 4/5    Left Shoulder ABduction 5/5   posterior deltoid/horizontal ABDCT: 4/5   Right Elbow Flexion 5/5    Right Elbow Extension 5/5    Left Elbow Flexion 5/5    Left Elbow Extension 5/5            INTERVENTION THIS DATE: -seated (upright) cervical rotation to end range 15x3secH bilat (~40 degrees Right; ~40 degrees left)  -seated left trunk rotation 1x10, YTB 18" band at 50% stretch  -standing shoulder extension 1x10 YTB  -standing shoulder T c YTB 1x15 (resistance decreased due to weakness)      PT Short Term Goals - 07/25/20 1512      PT SHORT TERM GOAL #1   Title Patient will be independent with her HEP to decrease pain, improve cervical and lumbar AROM, improve strength and function.    Baseline Pt has started her HEP. (06/15/2020); Pt performing HEP, no questions (07/25/2020)    Time 3    Period Weeks    Status Achieved    Target Date 07/07/20             PT Long Term Goals - 08/11/20 0956      PT LONG TERM GOAL #1   Title Patient will have a decrease in neck pain to 5/10 or less at worst to promote ability to perform chores as well as ambulate with less difficulty.    Baseline 10/10 at worst for the past 3 months (06/15/2020); 7/10 at most for the past 7 days (07/25/2020)    Time 8    Period Weeks    Status Achieved    Target Date  08/11/20  PT LONG TERM GOAL #2   Title Patient will have a decrease in low back pain to 2/10 or less at most to promote ability to peform chores, as well as ambulate more comfortably.    Baseline 5/10 low back pain at most for the past 3 months (06/15/2020); 5/10 at most for the past 7 days (07/25/2020); 08/11/20 (3/10)    Time 8    Period Weeks    Status On-going    Target Date 08/11/20      PT LONG TERM GOAL #3   Title Patient will improve bilateral hip extension and abduction strength by at least 1/2 MMT grade to promote ability to ambulate and perform chores with less back pain.    Time 8    Period Weeks    Status Achieved    Target Date 08/11/20      PT LONG TERM GOAL #4   Title Patient will improve her lumbar spine FOTO by at least 10 points as a demonstration of improved function.    Baseline Lumbar spine FOTO: 49 (06/15/2020); 44 (07/20/2020); 52 (08/11/20)    Time 8    Period Weeks    Status On-going    Target Date 10/06/20      PT LONG TERM GOAL #5   Title Pt to demonstrate 10 degrees improvement in cervical rotation bilat, 10 degrees improvement in cervical extension, and 5 dgerees improvement in sidebending bilat to improve funcitonal mobility for driving, kitchen tasks, and grooming.    Baseline all limited >60%    Time 8    Period Weeks    Status New    Target Date 10/06/20                 Plan - 08/11/20 1031    Clinical Impression Statement Reassessment/recertification this date. Pt continues to make gradual progress in strength, activity tolerance, and basic daily function AEB FOTO score. Pt has met 75% of goals thus far. Cervical ROM remains significantly restricted and will conitnue to be a focus to improve safety and function with driving, grooming, and overhead activities in kitchen.    Personal Factors and Comorbidities Comorbidity 3+;Age;Fitness;Past/Current Experience;Time since onset of injury/illness/exacerbation    Comorbidities Pacemaker, HTN,  dysrhythmia    Examination-Activity Limitations Bed Mobility;Dressing;Transfers;Bend;Stairs;Carry    Stability/Clinical Decision Making Stable/Uncomplicated    Clinical Decision Making Low    Rehab Potential Good    PT Frequency 2x / week    PT Duration 8 weeks    PT Treatment/Interventions Neuromuscular re-education;Therapeutic activities;Therapeutic exercise;Functional mobility training;Patient/family education;Manual techniques;Dry needling;Aquatic Therapy;Electrical Stimulation;Iontophoresis 56m/ml Dexamethasone;Traction;Gait training;Stair training;Balance training    PT Next Visit Plan thoracic extension, scapular, trunk, hip strengthening, posture, manual techniques, modalities PRN    PT Home Exercise Plan Medbridge Access Code [email or web address]   Consulted and Agree with Plan of Care Patient           Patient will benefit from skilled therapeutic intervention in order to improve the following deficits and impairments:  Pain, Postural dysfunction, Improper body mechanics, Difficulty walking, Decreased strength, Abnormal gait  Visit Diagnosis: Cervicalgia  Muscle weakness (generalized)  Chronic bilateral low back pain, unspecified whether sciatica present  Difficulty in walking, not elsewhere classified     Problem List Patient Active Problem List   Diagnosis Date Noted  . Acute hyponatremia 06/26/2019  . Generalized weakness 12/07/2018  . Neck pain 05/13/2017  . Sinus node dysfunction ([place] 06/22/2015  . PAF (paroxysmal atrial fibrillation) ([place] 06/11/2014  . URI (upper  respiratory infection) 10/21/2013  . Mixed hyperlipidemia 02/06/2012   10:36 AM, 08/11/20 Etta Grandchild, PT, DPT Physical Therapist - [place] [phone] (Office)   Tatym Schermer C 08/11/2020, 10:36 AM  [facility] [address], [place], [address] Phone: [phone]   Fax:  [phone]  Name: TIYE HUWE MRN:  [id number] Date of Birth: 1936-08-04

## 2020-08-15 ENCOUNTER — Other Ambulatory Visit: Payer: Self-pay

## 2020-08-15 ENCOUNTER — Ambulatory Visit: Payer: PPO

## 2020-08-15 DIAGNOSIS — M6281 Muscle weakness (generalized): Secondary | ICD-10-CM

## 2020-08-15 DIAGNOSIS — G8929 Other chronic pain: Secondary | ICD-10-CM

## 2020-08-15 DIAGNOSIS — R262 Difficulty in walking, not elsewhere classified: Secondary | ICD-10-CM

## 2020-08-15 DIAGNOSIS — M542 Cervicalgia: Secondary | ICD-10-CM | POA: Diagnosis not present

## 2020-08-15 DIAGNOSIS — M545 Low back pain, unspecified: Secondary | ICD-10-CM

## 2020-08-15 NOTE — Therapy (Signed)
[facility] [address] [place], [address] Phone: [phone]   Fax:  [phone]  Physical Therapy Treatment  Patient Details  Name: Hailey Simmons MRN: [id number] Date of Birth: 1936-01-09 Referring Provider (PT): Kary Kos, MD   Encounter Date: 08/15/2020   PT End of Session - 08/15/20 1036    Visit Number 2    Number of Visits 17    Date for PT Re-Evaluation 10/06/20    PT Start Time 1036    PT Stop Time 1117    PT Time Calculation (min) 41 min    Activity Tolerance Patient tolerated treatment well    Behavior During Therapy [facility] for tasks assessed/performed           Past Medical History:  Diagnosis Date  . Asthma 1970's - 1990's   a. ? 2/2 wood stove usage.  . Basal cell carcinoma of skin of nose   . Diverticulosis   . Dysrhythmia    A-FIB  . Goiter   . History of chicken pox   . Hx of colonic polyps   . Hypertension   . Hypothyroidism   . Menopause   . PAF (paroxysmal atrial fibrillation) ([place])    on apixoban  . Sinus node dysfunction (HCC)    a. s/p MDT dual chamber pacemaker  . Small bowel obstruction ([place]) 08/2009    Past Surgical History:  Procedure Laterality Date  . CATARACT EXTRACTION  02/2019  . CATARACT EXTRACTION  04/2019  . COLONOSCOPY    . COLONOSCOPY WITH PROPOFOL N/A 12/02/2018   Procedure: COLONOSCOPY WITH PROPOFOL;  Surgeon: Lollie Sails, MD;  Location: [facility] ENDOSCOPY;  Service: Endoscopy;  Laterality: N/A;  . EP IMPLANTABLE DEVICE N/A 06/22/2015   MDT MRI compatible dual chamber PPM implanted by Dr Caryl Comes for symptomatic bradycardia  . INSERT / REPLACE / REMOVE PACEMAKER      There were no vitals filed for this visit.   Subjective Assessment - 08/15/20 1037    Subjective Neck is low, its not bad. Did too much yesterday which bothered her neck. Today it feels pretty good, 1/10 neck pain. 2/10 low back pain currently.    Pertinent History Cervicalgia,  lumbago with sciatica. Neck is currently bothering her the most. Neck pain began about 3 years ago. Sitting and doing nothing, pt is fine. However when pt performs chores such as cooking and cleaning or yardwork, or walking a while. Gradual onset of pain. Heating pad used to help. Stopped using heating pad because she uses aspercreme and she's not supposed to put a heating pad on it. Also has a pacemaker since 5 years ago.  Had x-ray for back and was told that her spine was crooked. Pt was also told that she might have had a fracture in her low back, also has arthritis, degenerative discs, spurring in her spine.  Low back pain has been bothering her on and off for the past 5-6 years. Denies loss of bowel or bladder control or LE paresthesia.  Sudden onset of back pain, unknown method of injury.  No falls within the past 6 months. No fear of falling.    Currently in Pain? Yes    Pain Score 2    1/10 neck, 2/10 back pain   Pain Location Back  PT Education - 08/15/20 1130    Education Details ther-ex    Person(s) Educated Patient    Methods Explanation;Demonstration;Verbal cues;Tactile cues    Comprehension Returned demonstration;Verbalized understanding          MedbridgeAccess Code [email or web address]  Blood pressure is controlled per pt No latex band allergies  Cervical symptoms along C5 dermatome proximally  PACEMAKER  Observation: pt tendency to protract neck.   Movementdirectionpreference for low back: R side bend     Manual therapy Seated STM lumbar paraspinal muscles to decrease tension   TTP L L4 area  Therapeutic Exercise   Seated L lateral shift isometrics for low back in neutral to counter R lateral shift posture of low back 10x2 with 10 second holds  Seated thoracie extension over chair 10x5 seconds for 3 sets to decrease extension stress to low back   Side stepping, yellow band around distal thighs  32 ft to the R and 32 ft to the L to promote glute med muscle strengthening  Standing low rows yellow band 10x5 seconds for 2 sets  Sit <> stand from regular chair with emphasis on femoral control and proper weight shifting 10x  Gait with transversus abdominis contraction 15 ft.  Decreased low back pain during gait with activation of transversus abdominis muscles. Pt was recommended to perform contraction during gait. Pt verbalized understanding.     Improved exercise technique, movement at target joints, use of target muscles aftermin tomod verbal, visual, tactile cues.   Response to treatment/ clinical impression Continued working on improving thoracic extension and glute med and trunk strength to help decrease stress to low back during gait. Pt tolerated session well without aggravation of symptoms. Pt will benefit from continued skilled physical therapy services to decrease pain, improve strength and function.        PT Short Term Goals - 07/25/20 1512      PT SHORT TERM GOAL #1   Title Patient will be independent with her HEP to decrease pain, improve cervical and lumbar AROM, improve strength and function.    Baseline Pt has started her HEP. (06/15/2020); Pt performing HEP, no questions (07/25/2020)    Time 3    Period Weeks    Status Achieved    Target Date 07/07/20             PT Long Term Goals - 08/11/20 0956      PT LONG TERM GOAL #1   Title Patient will have a decrease in neck pain to 5/10 or less at worst to promote ability to perform chores as well as ambulate with less difficulty.    Baseline 10/10 at worst for the past 3 months (06/15/2020); 7/10 at most for the past 7 days (07/25/2020)    Time 8    Period Weeks    Status Achieved    Target Date 08/11/20      PT LONG TERM GOAL #2   Title Patient will have a decrease in low back pain to 2/10 or less at most to promote ability to peform chores, as well as ambulate more comfortably.    Baseline 5/10 low  back pain at most for the past 3 months (06/15/2020); 5/10 at most for the past 7 days (07/25/2020); 08/11/20 (3/10)    Time 8    Period Weeks    Status On-going    Target Date 08/11/20      PT LONG TERM GOAL #3   Title Patient will improve bilateral hip extension and  abduction strength by at least 1/2 MMT grade to promote ability to ambulate and perform chores with less back pain.    Time 8    Period Weeks    Status Achieved    Target Date 08/11/20      PT LONG TERM GOAL #4   Title Patient will improve her lumbar spine FOTO by at least 10 points as a demonstration of improved function.    Baseline Lumbar spine FOTO: 49 (06/15/2020); 44 (07/20/2020); 52 (08/11/20)    Time 8    Period Weeks    Status On-going    Target Date 10/06/20      PT LONG TERM GOAL #5   Title Pt to demonstrate 10 degrees improvement in cervical rotation bilat, 10 degrees improvement in cervical extension, and 5 dgerees improvement in sidebending bilat to improve funcitonal mobility for driving, kitchen tasks, and grooming.    Baseline all limited >60%    Time 8    Period Weeks    Status New    Target Date 10/06/20                 Plan - 08/15/20 1127    Clinical Impression Statement Continued working on improving thoracic extension and glute med and trunk strength to help decrease stress to low back during gait. Pt tolerated session well without aggravation of symptoms. Pt will benefit from continued skilled physical therapy services to decrease pain, improve strength and function.    Personal Factors and Comorbidities Comorbidity 3+;Age;Fitness;Past/Current Experience;Time since onset of injury/illness/exacerbation    Comorbidities Pacemaker, HTN, dysrhythmia    Examination-Activity Limitations Bed Mobility;Dressing;Transfers;Bend;Stairs;Carry    Stability/Clinical Decision Making Stable/Uncomplicated    Rehab Potential Good    PT Frequency 2x / week    PT Duration 8 weeks    PT Treatment/Interventions  Neuromuscular re-education;Therapeutic activities;Therapeutic exercise;Functional mobility training;Patient/family education;Manual techniques;Dry needling;Aquatic Therapy;Electrical Stimulation;Iontophoresis 4mg /ml Dexamethasone;Traction;Gait training;Stair training;Balance training    PT Next Visit Plan thoracic extension, scapular, trunk, hip strengthening, posture, manual techniques, modalities PRN    PT Home Exercise Plan Medbridge Access Code [email or web address]    Consulted and Agree with Plan of Care Patient           Patient will benefit from skilled therapeutic intervention in order to improve the following deficits and impairments:  Pain, Postural dysfunction, Improper body mechanics, Difficulty walking, Decreased strength, Abnormal gait  Visit Diagnosis: Muscle weakness (generalized)  Chronic bilateral low back pain, unspecified whether sciatica present  Difficulty in walking, not elsewhere classified     Problem List Patient Active Problem List   Diagnosis Date Noted  . Acute hyponatremia 06/26/2019  . Generalized weakness 12/07/2018  . Neck pain 05/13/2017  . Sinus node dysfunction ([place]) 06/22/2015  . PAF (paroxysmal atrial fibrillation) ([place]) 06/11/2014  . URI (upper respiratory infection) 10/21/2013  . Mixed hyperlipidemia 02/06/2012    Joneen Boers PT, DPT   08/15/2020, 11:31 AM  [facility] [address] [place], [address] Phone: [phone]   Fax:  [phone]  Name: Hailey Simmons MRN: [id number] Date of Birth: [birth year]

## 2020-08-18 ENCOUNTER — Ambulatory Visit: Payer: PPO

## 2020-08-18 ENCOUNTER — Other Ambulatory Visit: Payer: Self-pay

## 2020-08-18 DIAGNOSIS — M542 Cervicalgia: Secondary | ICD-10-CM | POA: Diagnosis not present

## 2020-08-18 DIAGNOSIS — R262 Difficulty in walking, not elsewhere classified: Secondary | ICD-10-CM

## 2020-08-18 DIAGNOSIS — G8929 Other chronic pain: Secondary | ICD-10-CM

## 2020-08-18 DIAGNOSIS — M6281 Muscle weakness (generalized): Secondary | ICD-10-CM

## 2020-08-18 NOTE — Patient Instructions (Addendum)
supine hip IR stretch with towel  R 30 seconds x 3  L 30 seconds x 3  Reviewed and given as part of her HEP to be performed 3 times daily. Pt demonstrated and verbalized understanding, handout provided.    Standing R lateral shift correction. 10x3 with 5 seconds Reviewed and given as part of HEP. Pt demonstrated and verbalized understanding, handout provided.     Access Code: [email or web address] URL: [email or web address] Date: 08/18/2020 Prepared by: Joneen Boers  Exercises Seated Scapular Retraction - 5 x daily - 7 x weekly - 3 sets - 10 reps - 5 seconds hold Seated Cervical Retraction - 3 x daily - 7 x weekly - 3 sets - 10 reps - 5 seconds hold Supine Deep Neck Flexor Nods - 1 x daily - 7 x weekly - 3 sets - 10 reps - 5 seconds hold Supine Posterior Pelvic Tilt - 1 x daily - 7 x weekly - 3 sets - 10 reps - 5 seconds hold Hooklying Clamshell with Resistance - 1 x daily - 7 x weekly - 3 sets - 10 reps Seated Isometric Cervical Sidebending - 1 x daily - 7 x weekly - 3 sets - 10 reps - 5 hold Sidelying Hip Abduction - 1 x daily - 7 x weekly - 3 sets - 10 reps Hooklying Shoulder T - 1 x daily - 7 x weekly - 3 sets - 10 reps - 5 seconds hold Seated Cervical Sidebending AROM - 3 x daily - 7 x weekly - 2 sets - 3 reps - 30 seconds hold Seated Hip Internal Rotation AROM - 3 x daily - 7 x weekly - 3 sets - 10 reps

## 2020-08-18 NOTE — Therapy (Signed)
[place] [facility] [address] [place], [address] Phone: [phone]   Fax:  [phone]  Physical Therapy Treatment  Patient Details  Name: Hailey Simmons MRN: [id number] Date of Birth: [birth year] Referring Provider (PT): Kary Kos, MD   Encounter Date: 08/18/2020   PT End of Session - 08/18/20 0950    Visit Number 3    Number of Visits 17    Date for PT Re-Evaluation 10/06/20    PT Start Time 0950    PT Stop Time 1032    PT Time Calculation (min) 42 min    Activity Tolerance Patient tolerated treatment well    Behavior During Therapy [facility] for tasks assessed/performed           Past Medical History:  Diagnosis Date  . Asthma 1970's - 1990's   a. ? 2/2 wood stove usage.  . Basal cell carcinoma of skin of nose   . Diverticulosis   . Dysrhythmia    A-FIB  . Goiter   . History of chicken pox   . Hx of colonic polyps   . Hypertension   . Hypothyroidism   . Menopause   . PAF (paroxysmal atrial fibrillation) ([place])    on apixoban  . Sinus node dysfunction ([facility])    a. s/p MDT dual chamber pacemaker  . Small bowel obstruction ([place]) 08/2009    Past Surgical History:  Procedure Laterality Date  . CATARACT EXTRACTION  02/2019  . CATARACT EXTRACTION  04/2019  . COLONOSCOPY    . COLONOSCOPY WITH PROPOFOL N/A 12/02/2018   Procedure: COLONOSCOPY WITH PROPOFOL;  Surgeon: Lollie Sails, MD;  [facility]: [facility]COPY;  Service: Endoscopy;  Laterality: N/A;  . EP IMPLANTABLE DEVICE N/A 06/22/2015   MDT MRI compatible dual chamber PPM implanted by Dr Caryl Comes for symptomatic bradycardia  . INSERT / REPLACE / REMOVE PACEMAKER      There were no vitals filed for this visit.   Subjective Assessment - 08/18/20 0951    Subjective Neck and back feels good right now.  Something about when she walks and doing chores, bending, and pulling bothers her neck and back.   Was good after last session until doing a lot of  cleaning yesterday.    Pertinent History Cervicalgia, lumbago with sciatica. Neck is currently bothering her the most. Neck pain began about 3 years ago. Sitting and doing nothing, pt is fine. However when pt performs chores such as cooking and cleaning or yardwork, or walking a while. Gradual onset of pain. Heating pad used to help. Stopped using heating pad because she uses aspercreme and she's not supposed to put a heating pad on it. Also has a pacemaker since 5 years ago.  Had x-ray for back and was told that her spine was crooked. Pt was also told that she might have had a fracture in her low back, also has arthritis, degenerative discs, spurring in her spine.  Low back pain has been bothering her on and off for the past 5-6 years. Denies loss of bowel or bladder control or LE paresthesia.  Sudden onset of back pain, unknown method of injury.  No falls within the past 6 months. No fear of falling.    Currently in Pain? No/denies    Pain Score 0-No pain              [facility] PT Assessment - 08/18/20 1001      PROM   Overall PROM Comments  hip at 90/90 position. R: ER  R 26 degrees, L 26 degrees; IR R 28 degrees, L 36 degrees                                 PT Education - 08/18/20 1016    Education Details ther-ex, HEP    Person(s) Educated Patient    Methods Explanation;Demonstration;Tactile cues;Verbal cues;Handout    Comprehension Verbalized understanding;Returned demonstration           MedbridgeAccess Code [email or web address]  Blood pressure is controlled per pt No latex band allergies  Cervical symptoms along C5 dermatome proximally  PACEMAKER  Observation: pt tendency to protract neck.   Movementdirectionpreference for low back: R side bend    Therapeutic Exercise  Standing R lateral shift correction. 10x3 with 5 seconds  Supine R and L hip IR and ER 1x each way at 90/90 position  supine hip IR stretch with towel  R 30 seconds x 3  L 30  seconds x 3  Reviewed and given as part of her HEP. Pt demonstrated and verbalized understanding, handout provided.    Hip IR at 90/90 PROM   R 10x3  L 10x3  Reviewed new HEP. Pt demonstrated and verbalized understanding. Handout provided.      Improved exercise technique, movement at target joints, use of target muscles aftermin tomod verbal, visual, tactile cues.   Response to treatment/ clinical impression Worked on low back today to promote better mechanics in closed chain tasks to improve mechanics for neck up the chain. Worked on improving posture, as well as hip mobility to decrease stress to low back when walking. Improved R and L hip IR ROM after treatment. Pt tolerated session well without aggravation of symptoms. Pt will benefit from continued skilled physical therapy services to decrease pain, improve strength and function.      PT Short Term Goals - 07/25/20 1512      PT SHORT TERM GOAL #1   Title Patient will be independent with her HEP to decrease pain, improve cervical and lumbar AROM, improve strength and function.    Baseline Pt has started her HEP. (06/15/2020); Pt performing HEP, no questions (07/25/2020)    Time 3    Period Weeks    Status Achieved    Target Date 07/07/20             PT Long Term Goals - 08/11/20 0956      PT LONG TERM GOAL #1   Title Patient will have a decrease in neck pain to 5/10 or less at worst to promote ability to perform chores as well as ambulate with less difficulty.    Baseline 10/10 at worst for the past 3 months (06/15/2020); 7/10 at most for the past 7 days (07/25/2020)    Time 8    Period Weeks    Status Achieved    Target Date 08/11/20      PT LONG TERM GOAL #2   Title Patient will have a decrease in low back pain to 2/10 or less at most to promote ability to peform chores, as well as ambulate more comfortably.    Baseline 5/10 low back pain at most for the past 3 months (06/15/2020); 5/10 at most for the past 7 days  (07/25/2020); 08/11/20 (3/10)    Time 8    Period Weeks    Status On-going    Target Date 08/11/20  PT LONG TERM GOAL #3   Title Patient will improve bilateral hip extension and abduction strength by at least 1/2 MMT grade to promote ability to ambulate and perform chores with less back pain.    Time 8    Period Weeks    Status Achieved    Target Date 08/11/20      PT LONG TERM GOAL #4   Title Patient will improve her lumbar spine FOTO by at least 10 points as a demonstration of improved function.    Baseline Lumbar spine FOTO: 49 (06/15/2020); 44 (07/20/2020); 52 (08/11/20)    Time 8    Period Weeks    Status On-going    Target Date 10/06/20      PT LONG TERM GOAL #5   Title Pt to demonstrate 10 degrees improvement in cervical rotation bilat, 10 degrees improvement in cervical extension, and 5 dgerees improvement in sidebending bilat to improve funcitonal mobility for driving, kitchen tasks, and grooming.    Baseline all limited >60%    Time 8    Period Weeks    Status New    Target Date 10/06/20                 Plan - 08/18/20 1150    Clinical Impression Statement Worked on low back today to promote better mechanics in closed chain tasks to improve mechanics for neck up the chain. Worked on improving posture, as well as hip mobility to decrease stress to low back when walking. Improved R and L hip IR ROM after treatment. Pt tolerated session well without aggravation of symptoms. Pt will benefit from continued skilled physical therapy services to decrease pain, improve strength and function.    Personal Factors and Comorbidities Comorbidity 3+;Age;Fitness;Past/Current Experience;Time since onset of injury/illness/exacerbation    Comorbidities Pacemaker, HTN, dysrhythmia    Examination-Activity Limitations Bed Mobility;Dressing;Transfers;Bend;Stairs;Carry    Stability/Clinical Decision Making Stable/Uncomplicated    Rehab Potential Good    PT Frequency 2x / week    PT  Duration 8 weeks    PT Treatment/Interventions Neuromuscular re-education;Therapeutic activities;Therapeutic exercise;Functional mobility training;Patient/family education;Manual techniques;Dry needling;Aquatic Therapy;Electrical Stimulation;Iontophoresis 4mg /ml Dexamethasone;Traction;Gait training;Stair training;Balance training    PT Next Visit Plan thoracic extension, scapular, trunk, hip strengthening, posture, manual techniques, modalities PRN    PT Home Exercise Plan Medbridge Access Code [email or web address]    Consulted and Agree with Plan of Care Patient           Patient will benefit from skilled therapeutic intervention in order to improve the following deficits and impairments:  Pain, Postural dysfunction, Improper body mechanics, Difficulty walking, Decreased strength, Abnormal gait  Visit Diagnosis: Muscle weakness (generalized)  Chronic bilateral low back pain, unspecified whether sciatica present  Difficulty in walking, not elsewhere classified     Problem List Patient Active Problem List   Diagnosis Date Noted  . Acute hyponatremia 06/26/2019  . Generalized weakness 12/07/2018  . Neck pain 05/13/2017  . Sinus node dysfunction ([place]) 06/22/2015  . PAF (paroxysmal atrial fibrillation) ([place]) 06/11/2014  . URI (upper respiratory infection) 10/21/2013  . Mixed hyperlipidemia 02/06/2012    Joneen Boers PT, DPT   08/18/2020, 11:58 AM  [place] [facility] [address], [place], [address] Phone: [phone]   Fax:  [phone]   Name: Hailey Simmons MRN: [id number] Date of Birth: 1936/02/06

## 2020-08-22 ENCOUNTER — Other Ambulatory Visit: Payer: Self-pay

## 2020-08-22 ENCOUNTER — Ambulatory Visit: Payer: PPO

## 2020-08-22 DIAGNOSIS — G8929 Other chronic pain: Secondary | ICD-10-CM

## 2020-08-22 DIAGNOSIS — R262 Difficulty in walking, not elsewhere classified: Secondary | ICD-10-CM

## 2020-08-22 DIAGNOSIS — M542 Cervicalgia: Secondary | ICD-10-CM | POA: Diagnosis not present

## 2020-08-22 DIAGNOSIS — M6281 Muscle weakness (generalized): Secondary | ICD-10-CM

## 2020-08-22 NOTE — Therapy (Signed)
[place] [facility] [address] [place], [address] Phone: [phone]   Fax:  [phone]  Physical Therapy Treatment  Patient Details  Name: Hailey Simmons MRN: [id number] Date of Birth: 1936-02-09 Referring Provider (PT): Kary Kos, MD   Encounter Date: 08/22/2020   PT End of Session - 08/22/20 1058    Visit Number 4    Number of Visits 17    Date for PT Re-Evaluation 10/06/20    PT Start Time 1058    PT Stop Time 1138    PT Time Calculation (min) 40 min    Activity Tolerance Patient tolerated treatment well    Behavior During Therapy [facility] for tasks assessed/performed           Past Medical History:  Diagnosis Date   Asthma 1970's - 1990's   a. ? 2/2 wood stove usage.   Basal cell carcinoma of skin of nose    Diverticulosis    Dysrhythmia    A-FIB   Goiter    History of chicken pox    Hx of colonic polyps    Hypertension    Hypothyroidism    Menopause    PAF (paroxysmal atrial fibrillation) ([place])    on apixoban   Sinus node dysfunction ([place])    a. s/p MDT dual chamber pacemaker   Small bowel obstruction ([place]) 08/2009    Past Surgical History:  Procedure Laterality Date   CATARACT EXTRACTION  02/2019   CATARACT EXTRACTION  04/2019   COLONOSCOPY     COLONOSCOPY WITH PROPOFOL N/A 12/02/2018   Procedure: COLONOSCOPY WITH PROPOFOL;  Surgeon: Lollie Sails, MD;  Location: [facility] ENDOSCOPY;  Service: Endoscopy;  Laterality: N/A;   EP IMPLANTABLE DEVICE N/A 06/22/2015   MDT MRI compatible dual chamber PPM implanted by Dr Caryl Comes for symptomatic bradycardia   INSERT / REPLACE / REMOVE PACEMAKER      There were no vitals filed for this visit.   Subjective Assessment - 08/22/20 1101    Subjective Neck and back are doing pretty good today. Actually stood to cook breakfast without really having pain. Able to look up more comfortably at the kitchen cabinets.  3/10 neck and back pain  at most for the past 7 days.    Pertinent History Cervicalgia, lumbago with sciatica. Neck is currently bothering her the most. Neck pain began about 3 years ago. Sitting and doing nothing, pt is fine. However when pt performs chores such as cooking and cleaning or yardwork, or walking a while. Gradual onset of pain. Heating pad used to help. Stopped using heating pad because she uses aspercreme and she's not supposed to put a heating pad on it. Also has a pacemaker since 5 years ago.  Had x-ray for back and was told that her spine was crooked. Pt was also told that she might have had a fracture in her low back, also has arthritis, degenerative discs, spurring in her spine.  Low back pain has been bothering her on and off for the past 5-6 years. Denies loss of bowel or bladder control or LE paresthesia.  Sudden onset of back pain, unknown method of injury.  No falls within the past 6 months. No fear of falling.    Currently in Pain? No/denies    Pain Score 0-No pain  PT Education - 08/22/20 1112    Education Details ther-ex    Person(s) Educated Patient    Methods Explanation;Demonstration;Tactile cues;Verbal cues    Comprehension Returned demonstration;Verbalized understanding          MedbridgeAccess Code [email or web address]  Blood pressure is controlled per pt No latex band allergies  Cervical symptoms along C5 dermatome proximally  PACEMAKER  Observation: pt tendency to protract neck.   Movementdirectionpreference for low back: R side bend    Therapeutic Exercise   Sit <> stand with B UE assist 5x, emphasis on femoral control   supine hip IR stretch with towel             R 30 seconds x 3             L 30 seconds x 3             Reviewed to make sure pt performing properly at home. Pt demonstrated understanding.   Hip IR at 90/90 PROM              R 10x3             L 10x3  Standing R lateral shift  correction. 10x3 with 5 seconds  Side step yellow band around knees 32 ft to the R and 32 ft to the L. Good glute med muscle use felt   Standing hip extension B UE assist yellow band around thighs  L 10x. Low back discomfort slightly. Eases with rest  R 10x.   Standing hip abduction with B UE assist  R yellow band 10x2  L yellow band 10x2   Seated manually resisted trunk flexion isometrics in neutral, PT manually resisted 10x5 seconds for 3 sets     Improved exercise technique, movement at target joints, use of target muscles aftermin tomod verbal, visual, tactile cues.   Response to treatment/ clinical impression Pt demonstrates overall improved cervical and low back pain and function based on subjective reports. Continued working on decreasing lateral shift posture, improving thoracic extension, hip mobility, trunk and glute strength to decrease stress to cervical spine and low back especially when performing chores. Pt tolerated session well without aggravation of symptoms. Pt will benefit from continued skilled physical therapy services to decrease pain, improve strength and function.       PT Short Term Goals - 07/25/20 1512      PT SHORT TERM GOAL #1   Title Patient will be independent with her HEP to decrease pain, improve cervical and lumbar AROM, improve strength and function.    Baseline Pt has started her HEP. (06/15/2020); Pt performing HEP, no questions (07/25/2020)    Time 3    Period Weeks    Status Achieved    Target Date 07/07/20             PT Long Term Goals - 08/11/20 0956      PT LONG TERM GOAL #1   Title Patient will have a decrease in neck pain to 5/10 or less at worst to promote ability to perform chores as well as ambulate with less difficulty.    Baseline 10/10 at worst for the past 3 months (06/15/2020); 7/10 at most for the past 7 days (07/25/2020)    Time 8    Period Weeks    Status Achieved    Target Date 08/11/20      PT LONG  TERM GOAL #2   Title Patient will have a decrease in low back pain  to 2/10 or less at most to promote ability to peform chores, as well as ambulate more comfortably.    Baseline 5/10 low back pain at most for the past 3 months (06/15/2020); 5/10 at most for the past 7 days (07/25/2020); 08/11/20 (3/10)    Time 8    Period Weeks    Status On-going    Target Date 08/11/20      PT LONG TERM GOAL #3   Title Patient will improve bilateral hip extension and abduction strength by at least 1/2 MMT grade to promote ability to ambulate and perform chores with less back pain.    Time 8    Period Weeks    Status Achieved    Target Date 08/11/20      PT LONG TERM GOAL #4   Title Patient will improve her lumbar spine FOTO by at least 10 points as a demonstration of improved function.    Baseline Lumbar spine FOTO: 49 (06/15/2020); 44 (07/20/2020); 52 (08/11/20)    Time 8    Period Weeks    Status On-going    Target Date 10/06/20      PT LONG TERM GOAL #5   Title Pt to demonstrate 10 degrees improvement in cervical rotation bilat, 10 degrees improvement in cervical extension, and 5 dgerees improvement in sidebending bilat to improve funcitonal mobility for driving, kitchen tasks, and grooming.    Baseline all limited >60%    Time 8    Period Weeks    Status New    Target Date 10/06/20                 Plan - 08/22/20 1113    Clinical Impression Statement Pt demonstrates overall improved cervical and low back pain and function based on subjective reports. Continued working on decreasing lateral shift posture, improving thoracic extension, hip mobility, trunk and glute strength to decrease stress to cervical spine and low back especially when performing chores. Pt tolerated session well without aggravation of symptoms. Pt will benefit from continued skilled physical therapy services to decrease pain, improve strength and function.    Personal Factors and Comorbidities Comorbidity  3+;Age;Fitness;Past/Current Experience;Time since onset of injury/illness/exacerbation    Comorbidities Pacemaker, HTN, dysrhythmia    Examination-Activity Limitations Bed Mobility;Dressing;Transfers;Bend;Stairs;Carry    Stability/Clinical Decision Making Stable/Uncomplicated    Rehab Potential Good    PT Frequency 2x / week    PT Duration 8 weeks    PT Treatment/Interventions Neuromuscular re-education;Therapeutic activities;Therapeutic exercise;Functional mobility training;Patient/family education;Manual techniques;Dry needling;Aquatic Therapy;Electrical Stimulation;Iontophoresis 4mg /ml Dexamethasone;Traction;Gait training;Stair training;Balance training    PT Next Visit Plan thoracic extension, scapular, trunk, hip strengthening, posture, manual techniques, modalities PRN    PT Home Exercise Plan Medbridge Access Code [email or web address]    Consulted and Agree with Plan of Care Patient           Patient will benefit from skilled therapeutic intervention in order to improve the following deficits and impairments:  Pain, Postural dysfunction, Improper body mechanics, Difficulty walking, Decreased strength, Abnormal gait  Visit Diagnosis: Muscle weakness (generalized)  Chronic bilateral low back pain, unspecified whether sciatica present  Difficulty in walking, not elsewhere classified     Problem List Patient Active Problem List   Diagnosis Date Noted   Acute hyponatremia 06/26/2019   Generalized weakness 12/07/2018   Neck pain 05/13/2017   Sinus node dysfunction ([place]) 06/22/2015   PAF (paroxysmal atrial fibrillation) ([place]) 06/11/2014   URI (upper respiratory infection) 10/21/2013   Mixed hyperlipidemia 02/06/2012  Joneen Boers PT, DPT   08/22/2020, 11:59 AM  [place] [facility] [address] [place], [address] Phone: [phone]   Fax:  [phone]  Name: Hailey Simmons MRN: [id number] Date of Birth:  1936-02-25

## 2020-08-25 ENCOUNTER — Ambulatory Visit: Payer: PPO

## 2020-08-29 ENCOUNTER — Ambulatory Visit: Payer: PPO

## 2020-08-29 ENCOUNTER — Other Ambulatory Visit: Payer: Self-pay

## 2020-08-29 DIAGNOSIS — R262 Difficulty in walking, not elsewhere classified: Secondary | ICD-10-CM

## 2020-08-29 DIAGNOSIS — M6281 Muscle weakness (generalized): Secondary | ICD-10-CM

## 2020-08-29 DIAGNOSIS — M542 Cervicalgia: Secondary | ICD-10-CM

## 2020-08-29 DIAGNOSIS — G8929 Other chronic pain: Secondary | ICD-10-CM

## 2020-08-29 NOTE — Therapy (Signed)
[facility] [address] [place], [address] Phone: [phone]   Fax:  [phone]  Physical Therapy Treatment  Patient Details  Name: Hailey Simmons MRN: [id number] Date of Birth: [birth year] Referring Provider (PT): Kary Kos, MD   Encounter Date: 08/29/2020   PT End of Session - 08/29/20 1036    Visit Number 19    Number of Visits 33    Date for PT Re-Evaluation 10/06/20    PT Start Time 6568    PT Stop Time 1125    PT Time Calculation (min) 49 min    Activity Tolerance Patient tolerated treatment well    Behavior During Therapy [facility] for tasks assessed/performed           Past Medical History:  Diagnosis Date  . Asthma 1970's - 1990's   a. ? 2/2 wood stove usage.  . Basal cell carcinoma of skin of nose   . Diverticulosis   . Dysrhythmia    A-FIB  . Goiter   . History of chicken pox   . Hx of colonic polyps   . Hypertension   . Hypothyroidism   . Menopause   . PAF (paroxysmal atrial fibrillation) ([place])    on apixoban  . Sinus node dysfunction (HCC)    a. s/p MDT dual chamber pacemaker  . Small bowel obstruction ([place]) 08/2009    Past Surgical History:  Procedure Laterality Date  . CATARACT EXTRACTION  02/2019  . CATARACT EXTRACTION  04/2019  . COLONOSCOPY    . COLONOSCOPY WITH PROPOFOL N/A 12/02/2018   Procedure: COLONOSCOPY WITH PROPOFOL;  Surgeon: Lollie Sails, MD;  Location: [facility] ENDOSCOPY;  Service: Endoscopy;  Laterality: N/A;  . EP IMPLANTABLE DEVICE N/A 06/22/2015   MDT MRI compatible dual chamber PPM implanted by Dr Caryl Comes for symptomatic bradycardia  . INSERT / REPLACE / REMOVE PACEMAKER      There were no vitals filed for this visit.   Subjective Assessment - 08/29/20 1037    Subjective Feeling good. No neck or back pain currently. Does not know how it will be later. Back and neck were good after last week. Had a few problems Saturday. But can sit down and stretch back.  Not near as bad as it used to be but it does flare up every once in a while. 4/10 neck pain at most for the past 7 days after doing a lot of activity. 3/10 back pain at most for the past 7 days.    Pertinent History Cervicalgia, lumbago with sciatica. Neck is currently bothering her the most. Neck pain began about 3 years ago. Sitting and doing nothing, pt is fine. However when pt performs chores such as cooking and cleaning or yardwork, or walking a while. Gradual onset of pain. Heating pad used to help. Stopped using heating pad because she uses aspercreme and she's not supposed to put a heating pad on it. Also has a pacemaker since 5 years ago.  Had x-ray for back and was told that her spine was crooked. Pt was also told that she might have had a fracture in her low back, also has arthritis, degenerative discs, spurring in her spine.  Low back pain has been bothering her on and off for the past 5-6 years. Denies loss of bowel or bladder control or LE paresthesia.  Sudden onset of back pain, unknown method of injury.  No falls within the past 6 months. No fear of falling.  Currently in Pain? No/denies    Pain Score 0-No pain                                     PT Education - 08/29/20 1042    Education Details ther-ex    Person(s) Educated Patient    Methods Explanation;Demonstration;Tactile cues;Verbal cues    Comprehension Returned demonstration;Verbalized understanding           MedbridgeAccess Code [email or web address]  Blood pressure is controlled per pt No latex band allergies  Cervical symptoms along C5 dermatome proximally  PACEMAKER  Observation: pt tendency to protract neck.   Movementdirectionpreference for low back: R side bend    Therapeutic Exercise  seated hip IR/piriformis stretch  R 30 seconds x 3  L 30 seconds x 3  Hip IR at 90/90 PROM  R 10x3 L 10x3   Standing R lateral shift correction. 10x3 with  5 seconds  Seated manual L shift isometrics in neutral, PT manual resistance 10x3 with 5 second holds    Standing hip abduction with B UE assist             R yellow band around ankles 10x             L yellow band around ankles 10x   SLS with B UE assist  R 10x5 seconds   L 10x5 seconds   Chin tucks 10x2 with  5 second holdss  Reviewed plan of care: pt can go to 1x/week secondary to good progress.     Improved exercise technique, movement at target joints, use of target muscles aftermin tomod verbal, visual, tactile cues.    Manual therapy   Seated STM Cervical paraspinal muscles to decrease tension  Seated STM R rhomboid minor muscle to decrease tension     Response to treatment/ clinical impression  Patient making very good progress with pain levels for neck and back based on subjective reports. Can go to 1x/week secondary to good progress but to return to 2x/week if needed. Continued working on improving thoracic extension, posture, hip mobility and glute strength to decrease stress to neck, and low back. Pt tolerated session well without aggravation of symptoms.  Pt will benefit from continued skilled physical therapy services to decrease pain, improve posture, strength, and function.      PT Short Term Goals - 07/25/20 1512      PT SHORT TERM GOAL #1   Title Patient will be independent with her HEP to decrease pain, improve cervical and lumbar AROM, improve strength and function.    Baseline Pt has started her HEP. (06/15/2020); Pt performing HEP, no questions (07/25/2020)    Time 3    Period Weeks    Status Achieved    Target Date 07/07/20             PT Long Term Goals - 08/11/20 0956      PT LONG TERM GOAL #1   Title Patient will have a decrease in neck pain to 5/10 or less at worst to promote ability to perform chores as well as ambulate with less difficulty.    Baseline 10/10 at worst for the past 3 months (06/15/2020); 7/10 at most for the  past 7 days (07/25/2020)    Time 8    Period Weeks    Status Achieved    Target Date 08/11/20      PT LONG TERM GOAL #  2   Title Patient will have a decrease in low back pain to 2/10 or less at most to promote ability to peform chores, as well as ambulate more comfortably.    Baseline 5/10 low back pain at most for the past 3 months (06/15/2020); 5/10 at most for the past 7 days (07/25/2020); 08/11/20 (3/10)    Time 8    Period Weeks    Status On-going    Target Date 08/11/20      PT LONG TERM GOAL #3   Title Patient will improve bilateral hip extension and abduction strength by at least 1/2 MMT grade to promote ability to ambulate and perform chores with less back pain.    Time 8    Period Weeks    Status Achieved    Target Date 08/11/20      PT LONG TERM GOAL #4   Title Patient will improve her lumbar spine FOTO by at least 10 points as a demonstration of improved function.    Baseline Lumbar spine FOTO: 49 (06/15/2020); 44 (07/20/2020); 52 (08/11/20)    Time 8    Period Weeks    Status On-going    Target Date 10/06/20      PT LONG TERM GOAL #5   Title Pt to demonstrate 10 degrees improvement in cervical rotation bilat, 10 degrees improvement in cervical extension, and 5 dgerees improvement in sidebending bilat to improve funcitonal mobility for driving, kitchen tasks, and grooming.    Baseline all limited >60%    Time 8    Period Weeks    Status New    Target Date 10/06/20                 Plan - 08/29/20 1042    Clinical Impression Statement Patient making very good progress with pain levels for neck and back based on subjective reports. Can go to 1x/week secondary to good progress but to return to 2x/week if needed. Continued working on improving thoracic extension, posture, hip mobility and glute strength to decrease stress to neck, and low back. Pt tolerated session well without aggravation of symptoms.  Pt will benefit from continued skilled physical therapy services to  decrease pain, improve posture, strength, and function.    Personal Factors and Comorbidities Comorbidity 3+;Age;Fitness;Past/Current Experience;Time since onset of injury/illness/exacerbation    Comorbidities Pacemaker, HTN, dysrhythmia    Examination-Activity Limitations Bed Mobility;Dressing;Transfers;Bend;Stairs;Carry    Stability/Clinical Decision Making Stable/Uncomplicated    Rehab Potential Good    PT Frequency 2x / week    PT Duration 8 weeks    PT Treatment/Interventions Neuromuscular re-education;Therapeutic activities;Therapeutic exercise;Functional mobility training;Patient/family education;Manual techniques;Dry needling;Aquatic Therapy;Electrical Stimulation;Iontophoresis 4mg /ml Dexamethasone;Traction;Gait training;Stair training;Balance training    PT Next Visit Plan thoracic extension, scapular, trunk, hip strengthening, posture, manual techniques, modalities PRN    PT Home Exercise Plan Medbridge Access Code [email or web address]    Consulted and Agree with Plan of Care Patient           Patient will benefit from skilled therapeutic intervention in order to improve the following deficits and impairments:  Pain, Postural dysfunction, Improper body mechanics, Difficulty walking, Decreased strength, Abnormal gait  Visit Diagnosis: Muscle weakness (generalized)  Chronic bilateral low back pain, unspecified whether sciatica present  Difficulty in walking, not elsewhere classified  Cervicalgia     Problem List Patient Active Problem List   Diagnosis Date Noted  . Acute hyponatremia 06/26/2019  . Generalized weakness 12/07/2018  . Neck pain 05/13/2017  . Sinus node dysfunction (HCC)  06/22/2015  . PAF (paroxysmal atrial fibrillation) ([place]) 06/11/2014  . URI (upper respiratory infection) 10/21/2013  . Mixed hyperlipidemia 02/06/2012    Joneen Boers PT, DPT   08/29/2020, 11:41 AM  [facility] [address] [place], [address] Phone: [phone]   Fax:  [phone]  Name: HIEN CUNLIFFE MRN: [id number] Date of Birth: May 12, 1936

## 2020-08-30 DIAGNOSIS — L986 Other infiltrative disorders of the skin and subcutaneous tissue: Secondary | ICD-10-CM | POA: Diagnosis not present

## 2020-08-30 DIAGNOSIS — S50861A Insect bite (nonvenomous) of right forearm, initial encounter: Secondary | ICD-10-CM | POA: Diagnosis not present

## 2020-08-31 ENCOUNTER — Ambulatory Visit: Payer: PPO

## 2020-09-05 DIAGNOSIS — I4891 Unspecified atrial fibrillation: Secondary | ICD-10-CM | POA: Diagnosis not present

## 2020-09-05 DIAGNOSIS — I10 Essential (primary) hypertension: Secondary | ICD-10-CM | POA: Diagnosis not present

## 2020-09-06 ENCOUNTER — Other Ambulatory Visit: Payer: Self-pay

## 2020-09-06 ENCOUNTER — Encounter: Payer: Self-pay | Admitting: Internal Medicine

## 2020-09-06 ENCOUNTER — Ambulatory Visit: Payer: PPO | Admitting: Internal Medicine

## 2020-09-06 VITALS — BP 108/60 | HR 78 | Ht 61.0 in | Wt 164.8 lb

## 2020-09-06 DIAGNOSIS — I4510 Unspecified right bundle-branch block: Secondary | ICD-10-CM

## 2020-09-06 DIAGNOSIS — I495 Sick sinus syndrome: Secondary | ICD-10-CM

## 2020-09-06 DIAGNOSIS — I480 Paroxysmal atrial fibrillation: Secondary | ICD-10-CM | POA: Diagnosis not present

## 2020-09-06 DIAGNOSIS — I10 Essential (primary) hypertension: Secondary | ICD-10-CM

## 2020-09-06 DIAGNOSIS — Z79899 Other long term (current) drug therapy: Secondary | ICD-10-CM

## 2020-09-06 DIAGNOSIS — Z950 Presence of cardiac pacemaker: Secondary | ICD-10-CM

## 2020-09-06 LAB — CUP PACEART INCLINIC DEVICE CHECK
Battery Remaining Longevity: 61 mo
Battery Voltage: 3 V
Brady Statistic AP VP Percent: 0.46 %
Brady Statistic AP VS Percent: 96.29 %
Brady Statistic AS VP Percent: 0 %
Brady Statistic AS VS Percent: 3.24 %
Brady Statistic RA Percent Paced: 96.74 %
Brady Statistic RV Percent Paced: 0.47 %
Date Time Interrogation Session: 20211005094930
Implantable Lead Implant Date: 20160720
Implantable Lead Implant Date: 20160720
Implantable Lead Location: 753859
Implantable Lead Location: 753860
Implantable Lead Model: 5076
Implantable Lead Model: 5076
Implantable Pulse Generator Implant Date: 20160720
Lead Channel Impedance Value: 342 Ohm
Lead Channel Impedance Value: 475 Ohm
Lead Channel Impedance Value: 551 Ohm
Lead Channel Impedance Value: 608 Ohm
Lead Channel Pacing Threshold Amplitude: 1 V
Lead Channel Pacing Threshold Amplitude: 1 V
Lead Channel Pacing Threshold Pulse Width: 0.4 ms
Lead Channel Pacing Threshold Pulse Width: 0.4 ms
Lead Channel Sensing Intrinsic Amplitude: 2.25 mV
Lead Channel Sensing Intrinsic Amplitude: 9.25 mV
Lead Channel Setting Pacing Amplitude: 2.25 V
Lead Channel Setting Pacing Amplitude: 2.5 V
Lead Channel Setting Pacing Pulse Width: 0.4 ms
Lead Channel Setting Sensing Sensitivity: 1.2 mV

## 2020-09-06 LAB — PACEMAKER DEVICE OBSERVATION

## 2020-09-06 MED ORDER — FLECAINIDE ACETATE 150 MG PO TABS: 75.0000 mg | ORAL_TABLET | Freq: Two times a day (BID) | ORAL | 6 refills | Status: DC

## 2020-09-06 NOTE — Patient Instructions (Addendum)
Medication Instructions:  - Your physician has recommended you make the following change in your medication:   1) HOLD diltiazem x 4 weeks to see how you feel  2) DECREASE flecainide to 75 mg twice daily  - you may use up your current supply of flecainide 100 mg by taking  1 tablet (100 mg) in the AM & 1/2 tablet (50 mg) in the PM  - then you will take 150 mg tablets 1/2 tablet (75 mg) twice daily   Samples Given: Eliquis 5 mg Lot: [id number] Exp: 09/2024 # 3 boxes given  *If you need a refill on your cardiac medications before your next appointment, please call your pharmacy*   Lab Work: - Your physician recommends that you have lab work today: TSH  If you have labs (blood work) drawn today and your tests are completely normal, you will receive your results only by: Marland Kitchen MyChart Message (if you have MyChart) OR . A paper copy in the mail If you have any lab test that is abnormal or we need to change your treatment, we will call you to review the results.   Testing/Procedures: - Your physician has recommended you have an EKG done in 3 weeks: Monday 09/26/20 (arrive at 8:15 am)  - [place] entrance at [facility] - 1st desk on the right to check in   Follow-Up: At [facility], you and your health needs are our priority.  As part of our continuing mission to provide you with exceptional heart care, we have created designated Provider Care Teams.  These Care Teams include your primary Cardiologist (physician) and Advanced Practice Providers (APPs -  Physician Assistants and Nurse Practitioners) who all work together to provide you with the care you need, when you need it.  We recommend signing up for the patient portal called "MyChart".  Sign up information is provided on this After Visit Summary.  MyChart is used to connect with patients for Virtual Visits (Telemedicine).  Patients are able to view lab/test results, encounter notes, upcoming appointments, etc.  Non-urgent messages  can be sent to your provider as well.   To learn more about what you can do with MyChart, go to [email or web address].    Your next appointment:   6 month(s)  The format for your next appointment:   In Person  Provider:   Virl Axe, MD   Other Instructions

## 2020-09-06 NOTE — Progress Notes (Signed)
Patient Care Team: Rusty Aus, MD as PCP - General (Unknown Physician Specialty) [name], MD as PCP - Cardiology (Cardiology) [name], MD as Consulting Physician (Cardiology)   HPI  Hailey Simmons is a 84 y.o. female Seen in followup for  Medtronic pacer implanted (2016)  for sinus node dysfunction and PAF  Hospitalized 7/20 with severe hyponatremia (118) diuretics were discontinued.  She has been followed by renal.      No shortness of breath but increasing levels of fatigue.  Ambulation is limited by combination of it and her back pain.  Scant intermittent edema.  No chest pain.  No interval atrial fibrillation of which she is aware.      DATE TEST EF   2/13    Echo  60 %   8/17    Myoview     55-60 % No Ischemia        Date Cr NA K TSH Hgb  2/18 0.9    12.6  8/18  0.75    12.5  2/19 0.8    12.8  7/20 .57 118   11.2>>11.9  10/20  137     2/21 0.7 137  1.05 13.5  8/21 0.8 136 4.1 6.29 12.4     DATE PR duration QRSduration Dose-flecianide  3/13 160 134 0  7/15  158 50  10/16 180 154 100  6/17`  146 100  7/18 (A paced) 280 140 100  8/19 Apaced 240 142 100   9/20 Apaced 236 154 100  4/21 Apaced 260 150 100  10/21 Apace 280 536    Thromboembolic risk factors ( age -71, HTN-1, Gender-1) for a CHADSVASc Score of 4   Past Medical History:  Diagnosis Date   Asthma 1970's - 1990's   a. ? 2/2 wood stove usage.   Basal cell carcinoma of skin of nose    Diverticulosis    Dysrhythmia    A-FIB   Goiter    History of chicken pox    Hx of colonic polyps    Hypertension    Hypothyroidism    Menopause    PAF (paroxysmal atrial fibrillation) ([place])    on apixoban   Sinus node dysfunction ([place])    a. s/p MDT dual chamber pacemaker   Small bowel obstruction ([place]) 08/2009    Past Surgical History:  Procedure Laterality Date   CATARACT EXTRACTION  02/2019   CATARACT EXTRACTION  04/2019   COLONOSCOPY     COLONOSCOPY  WITH PROPOFOL N/A 12/02/2018   Procedure: COLONOSCOPY WITH PROPOFOL;  Surgeon: Lollie Sails, MD;  Location: [facility] ENDOSCOPY;  Service: Endoscopy;  Laterality: N/A;   EP IMPLANTABLE DEVICE N/A 06/22/2015   MDT MRI compatible dual chamber PPM implanted by Dr Caryl Comes for symptomatic bradycardia   INSERT / REPLACE / REMOVE PACEMAKER      Current Outpatient Medications  Medication Sig Dispense Refill   atorvastatin (LIPITOR) 10 MG tablet TAKE ONE (1) TABLET EACH DAY 90 tablet 3   calcium carbonate (OS-CAL) 600 MG TABS Take 600 mg by mouth 2 (two) times daily with a meal.     Cholecalciferol (VITAMIN D3) 2000 UNITS TABS Take 1 tablet by mouth daily.     Cranberry 1000 MG CAPS Take 1 capsule by mouth daily.      diltiazem (CARDIZEM CD) 240 MG 24 hr capsule Take 240 mg by mouth at bedtime.     ELIQUIS 5 MG TABS tablet TAKE  ONE TABLET BY MOUTH TWICE DAILY 180 tablet 1   ESTRACE VAGINAL 0.1 MG/GM vaginal cream Place 1 Applicatorful vaginally as needed.      flecainide (TAMBOCOR) 100 MG tablet TAKE ONE TABLET TWICE DAILY 180 tablet 3   furosemide (LASIX) 20 MG tablet Take 1 tablet (20 mg total) by mouth daily as needed. 30 tablet 2   hydrALAZINE (APRESOLINE) 25 MG tablet Take 25 mg by mouth 2 (two) times daily.     levothyroxine (SYNTHROID, LEVOTHROID) 100 MCG tablet Take 100 mcg by mouth daily.     metoprolol succinate (TOPROL-XL) 25 MG 24 hr tablet TAKE ONE TABLET EVERY DAY 90 tablet 2   olmesartan (BENICAR) 40 MG tablet Take 40 mg by mouth daily.      polyethylene glycol (MIRALAX / GLYCOLAX) 17 g packet Take 17 g by mouth daily.     sodium chloride 1 g tablet Take by mouth. Rarely taken per pt     No current facility-administered medications for this visit.    No Known Allergies    Review of Systems negative except from HPI and PMH  Physical Exam  BP 108/60    Pulse 78    Ht 5\' 1"  (1.549 m)    Wt 164 lb 12.8 oz (74.8 kg)    BMI 31.14 kg/m  Well developed and well  nourished in no acute distress HENT normal Neck supple with JVP-flat Clear Device pocket well healed; without hematoma or erythema.  There is no tethering  Regular rate and rhythm, no  murmur Abd-soft with active BS No Clubbing cyanosis   edema Skin-warm and dry A & Oriented  Grossly normal sensory and motor function  ECG atrial pacing at 78 Atrial pacing 28/17/43 Right bundle branch block  Assessment and  Plan  Atrial fibrillation-paroxysmal  Sinus node dysfunction  Pacemaker-Medtronic       Hyponatremia  Right bundle branch block  Hypertension   No interval atrial fibrillation.  Progressive prolongation of AR interval and QRS duration; hence, we will decrease her flecainide dose from 200 mg daily--150 mg daily in divided doses.  We will plan to recheck an ECG in about 3 weeks.  I wonder to what degree some of her fatigue may be related to her medications, previous exclusion of metoprolol was unassociated with improvement.  Hence, we will have her hold her diltiazem and see if there is functional improvement.  No bleeding.

## 2020-09-07 ENCOUNTER — Ambulatory Visit: Payer: PPO | Attending: Neurosurgery

## 2020-09-07 ENCOUNTER — Other Ambulatory Visit: Payer: Self-pay

## 2020-09-07 DIAGNOSIS — M545 Low back pain, unspecified: Secondary | ICD-10-CM | POA: Insufficient documentation

## 2020-09-07 DIAGNOSIS — R262 Difficulty in walking, not elsewhere classified: Secondary | ICD-10-CM | POA: Diagnosis not present

## 2020-09-07 DIAGNOSIS — M6281 Muscle weakness (generalized): Secondary | ICD-10-CM | POA: Insufficient documentation

## 2020-09-07 DIAGNOSIS — G8929 Other chronic pain: Secondary | ICD-10-CM | POA: Diagnosis not present

## 2020-09-07 DIAGNOSIS — M542 Cervicalgia: Secondary | ICD-10-CM | POA: Insufficient documentation

## 2020-09-07 LAB — TSH: TSH: 2.39 u[IU]/mL (ref 0.450–4.500)

## 2020-09-07 NOTE — Therapy (Signed)
[place] [facility] [address] [place], [address] Phone: [phone]   Fax:  [phone]  Physical Therapy Treatment And Progress Report (07/26/20 - 09/07/2020)  Patient Details  Name: Hailey Simmons MRN: [id number] Date of Birth: 1936/08/12 Referring Provider (PT): Kary Kos, MD   Encounter Date: 09/07/2020   PT End of Session - 09/07/20 0946    Visit Number 20    Number of Visits 33    Date for PT Re-Evaluation 10/06/20    Authorization Type 10    Authorization Time Period of 10 progress report    PT Start Time 0947    PT Stop Time 1030    PT Time Calculation (min) 43 min    Activity Tolerance Patient tolerated treatment well    Behavior During Therapy [facility] for tasks assessed/performed           Past Medical History:  Diagnosis Date  . Asthma 1970's - 1990's   a. ? 2/2 wood stove usage.  . Basal cell carcinoma of skin of nose   . Diverticulosis   . Dysrhythmia    A-FIB  . Goiter   . History of chicken pox   . Hx of colonic polyps   . Hypertension   . Hypothyroidism   . Menopause   . PAF (paroxysmal atrial fibrillation) ([place])    on apixoban  . Sinus node dysfunction (HCC)    a. s/p MDT dual chamber pacemaker  . Small bowel obstruction ([place]) 08/2009    Past Surgical History:  Procedure Laterality Date  . CATARACT EXTRACTION  02/2019  . CATARACT EXTRACTION  04/2019  . COLONOSCOPY    . COLONOSCOPY WITH PROPOFOL N/A 12/02/2018   Procedure: COLONOSCOPY WITH PROPOFOL;  Surgeon: Lollie Sails, MD;  [facility];  Service: Endoscopy;  Laterality: N/A;  . EP IMPLANTABLE DEVICE N/A 06/22/2015   MDT MRI compatible dual chamber PPM implanted by Dr Caryl Comes for symptomatic bradycardia  . INSERT / REPLACE / REMOVE PACEMAKER      There were no vitals filed for this visit.   Subjective Assessment - 09/07/20 0948    Subjective Neck is a little bit tight but not bad. Just a little tighness,  not pain, like its pulling. No pain her back sitting.  Did some cooking a full breakfast this morning and neck was tight but overall good. Feels that the HEP is good but feels like she needs to continue the 1x/week follow ups.    Pertinent History Cervicalgia, lumbago with sciatica. Neck is currently bothering her the most. Neck pain began about 3 years ago. Sitting and doing nothing, pt is fine. However when pt performs chores such as cooking and cleaning or yardwork, or walking a while. Gradual onset of pain. Heating pad used to help. Stopped using heating pad because she uses aspercreme and she's not supposed to put a heating pad on it. Also has a pacemaker since 5 years ago.  Had x-ray for back and was told that her spine was crooked. Pt was also told that she might have had a fracture in her low back, also has arthritis, degenerative discs, spurring in her spine.  Low back pain has been bothering her on and off for the past 5-6 years. Denies loss of bowel or bladder control or LE paresthesia.  Sudden onset of back pain, unknown method of injury.  No falls within the past 6 months. No fear of falling.    Currently  in Pain? No/denies    Pain Score 0-No pain                                     PT Education - 09/07/20 1007    Education Details ther-ex, HEP    Person(s) Educated Patient    Methods Explanation;Demonstration;Tactile cues;Verbal cues;Handout    Comprehension Verbalized understanding;Returned demonstration          MedbridgeAccess Code [email or web address]  Blood pressure is controlled per pt No latex band allergies  Cervical symptoms along C5 dermatome proximally  PACEMAKER  Observation: pt tendency to protract neck.   Movementdirectionpreference for low back: R side bend    Therapeutic Exercise   Cervical AROM all planes 1x each way  seated hip IR/piriformis stretch             R 30 seconds x 3             L 30 seconds x 3   sitting  Hip IR at 90/90 PROM  R 10x3 L 10x3  Seated manual L shift isometrics in neutral, PT manual resistance 10x3 with 5 second holds   Manually resisted scapular retraction isometrics, targeting lower trap   L 10x3 with 5 seconds   SLS with B UE assist             R 10x5 seconds for 2 sets              L 10x5 seconds for 2 sets  Chin tucks 10x2 with  5 second holds   Improved exercise technique, movement at target joints, use of target muscles aftermin tomod verbal, visual, tactile cues.     Response to treatment/ clinical impression Decreased cervical tightness with rotation at end of session. Pt has made very good progress towards decreased neck and low back pain since initial evaluation. Continued working on decreasing R lateral shift posture, improving thoracic extension, hip mobility, glute, scapular and anterior cervical strength to help decrease stress to neck and back when performing her chores such as cooking. Pt will benefit from continued skilled physical therapy services to continue to decrease pain, improve strength and function.     PT Short Term Goals - 07/25/20 1512      PT SHORT TERM GOAL #1   Title Patient will be independent with her HEP to decrease pain, improve cervical and lumbar AROM, improve strength and function.    Baseline Pt has started her HEP. (06/15/2020); Pt performing HEP, no questions (07/25/2020)    Time 3    Period Weeks    Status Achieved    Target Date 07/07/20             PT Long Term Goals - 09/07/20 0949      PT LONG TERM GOAL #1   Title Patient will have a decrease in neck pain to 5/10 or less at worst to promote ability to perform chores as well as ambulate with less difficulty.    Baseline 10/10 at worst for the past 3 months (06/15/2020); 7/10 at most for the past 7 days (07/25/2020); 2/10 neck pain at most for the past 7 days (09/07/2020)    Time 8    Period Weeks    Status Achieved    Target Date  10/06/20      PT LONG TERM GOAL #2   Title Patient will have a decrease in low back pain  to 2/10 or less at most to promote ability to peform chores, as well as ambulate more comfortably.    Baseline 5/10 low back pain at most for the past 3 months (06/15/2020); 5/10 at most for the past 7 days (07/25/2020); 08/11/20 (3/10); 3/10 at most for the past 7 days (09/07/2020)    Time 3    Period Weeks    Status Partially Met    Target Date 10/06/20      PT LONG TERM GOAL #3   Title Patient will improve bilateral hip extension and abduction strength by at least 1/2 MMT grade to promote ability to ambulate and perform chores with less back pain.    Time 8    Period Weeks    Status Achieved      PT LONG TERM GOAL #4   Title Patient will improve her lumbar spine FOTO by at least 10 points as a demonstration of improved function.    Baseline Lumbar spine FOTO: 49 (06/15/2020); 44 (07/20/2020); 52 (08/11/20)    Time 8    Period Weeks    Status On-going      PT LONG TERM GOAL #5   Title Pt to demonstrate 10 degrees improvement in cervical rotation bilat, 10 degrees improvement in cervical extension, and 5 dgerees improvement in sidebending bilat to improve funcitonal mobility for driving, kitchen tasks, and grooming.    Baseline all limited >60%;  55 degrees R and L cervical rotation (R posterior lateral neck pull with L cervical rotation), cervical extension 50 degrees (no symptoms), cervical flexion 23 degrees with slight pulling, R cervical side bend 18 degrees with L neck pull, L cervical side bend 17 degrees with L neck pulling (09/07/2020)    Time 8    Period Weeks    Status On-going    Target Date 10/06/20                 Plan - 09/07/20 1024    Clinical Impression Statement Decreased cervical tightness with rotation at end of session. Pt has made very good progress towards decreased neck and low back pain since initial evaluation. Continued working on decreasing R lateral shift posture,  improving thoracic extension, hip mobility, glute, scapular and anterior cervical strength to help decrease stress to neck and back when performing her chores such as cooking. Pt will benefit from continued skilled physical therapy services to continued to decrease pain, improve strength and fuction    Personal Factors and Comorbidities Comorbidity 3+;Age;Fitness;Past/Current Experience;Time since onset of injury/illness/exacerbation    Comorbidities Pacemaker, HTN, dysrhythmia    Examination-Activity Limitations Bed Mobility;Dressing;Transfers;Bend;Stairs;Carry    Stability/Clinical Decision Making Stable/Uncomplicated    Clinical Decision Making Low    Rehab Potential Good    PT Frequency 2x / week    PT Duration 4 weeks    PT Treatment/Interventions Neuromuscular re-education;Therapeutic activities;Therapeutic exercise;Functional mobility training;Patient/family education;Manual techniques;Dry needling;Aquatic Therapy;Electrical Stimulation;Iontophoresis 32m/ml Dexamethasone;Traction;Gait training;Stair training;Balance training    PT Next Visit Plan thoracic extension, scapular, trunk, hip strengthening, posture, manual techniques, modalities PRN    PT Home Exercise Plan Medbridge Access Code [email or web address]   Consulted and Agree with Plan of Care Patient           Patient will benefit from skilled therapeutic intervention in order to improve the following deficits and impairments:  Pain, Postural dysfunction, Improper body mechanics, Difficulty walking, Decreased strength, Abnormal gait  Visit Diagnosis: Muscle weakness (generalized)  Chronic bilateral low back pain, unspecified whether sciatica present  Difficulty  in walking, not elsewhere classified  Cervicalgia     Problem List Patient Active Problem List   Diagnosis Date Noted  . Acute hyponatremia 06/26/2019  . Generalized weakness 12/07/2018  . Neck pain 05/13/2017  . Sinus node dysfunction ([place]) 06/22/2015  . PAF  (paroxysmal atrial fibrillation) ([place]) 06/11/2014  . URI (upper respiratory infection) 10/21/2013  . Mixed hyperlipidemia 02/06/2012   Thank you for your referral.  Joneen Boers PT, DPT   09/07/2020, 12:44 PM  [place] [facility] [address] [place], [address] Phone: [phone]   Fax:  [phone]  Name: Hailey Simmons MRN: [id number] Date of Birth: Apr 15, 1936

## 2020-09-07 NOTE — Patient Instructions (Signed)
Access Code: [email or web address] URL: [email or web address] Date: 09/07/2020 Prepared by: Joneen Boers  Exercises Seated Scapular Retraction - 5 x daily - 7 x weekly - 3 sets - 10 reps - 5 seconds hold Seated Cervical Retraction - 3 x daily - 7 x weekly - 3 sets - 10 reps - 5 seconds hold Supine Deep Neck Flexor Nods - 1 x daily - 7 x weekly - 3 sets - 10 reps - 5 seconds hold Supine Posterior Pelvic Tilt - 1 x daily - 7 x weekly - 3 sets - 10 reps - 5 seconds hold Hooklying Clamshell with Resistance - 1 x daily - 7 x weekly - 3 sets - 10 reps Seated Isometric Cervical Sidebending - 1 x daily - 7 x weekly - 3 sets - 10 reps - 5 hold Sidelying Hip Abduction - 1 x daily - 7 x weekly - 3 sets - 10 reps Hooklying Shoulder T - 1 x daily - 7 x weekly - 3 sets - 10 reps - 5 seconds hold Seated Cervical Sidebending AROM - 3 x daily - 7 x weekly - 2 sets - 3 reps - 30 seconds hold Seated Hip Internal Rotation AROM - 3 x daily - 7 x weekly - 3 sets - 10 reps Seated Piriformis Stretch - 1 x daily - 7 x weekly - 1 sets - 3 reps - 30 seconds hold

## 2020-09-12 ENCOUNTER — Other Ambulatory Visit: Payer: Self-pay

## 2020-09-12 ENCOUNTER — Ambulatory Visit: Payer: PPO

## 2020-09-12 DIAGNOSIS — R262 Difficulty in walking, not elsewhere classified: Secondary | ICD-10-CM

## 2020-09-12 DIAGNOSIS — G8929 Other chronic pain: Secondary | ICD-10-CM

## 2020-09-12 DIAGNOSIS — M542 Cervicalgia: Secondary | ICD-10-CM

## 2020-09-12 DIAGNOSIS — M6281 Muscle weakness (generalized): Secondary | ICD-10-CM

## 2020-09-12 DIAGNOSIS — L309 Dermatitis, unspecified: Secondary | ICD-10-CM | POA: Diagnosis not present

## 2020-09-12 NOTE — Therapy (Signed)
[place] [facility] [address], [place], [address] Phone: [phone]   Fax:  [phone]  Physical Therapy Treatment  Patient Details  Name: Hailey Simmons MRN: [id number] Date of Birth: 1935/12/18 Referring Provider (PT): Kary Kos, MD   Encounter Date: 09/12/2020   PT End of Session - 09/12/20 0953    Visit Number 21    Number of Visits 33    Date for PT Re-Evaluation 10/06/20    Authorization Type 1    Authorization Time Period of 10 progress report    PT Start Time 0954    PT Stop Time 1028    PT Time Calculation (min) 34 min    Activity Tolerance Patient tolerated treatment well    Behavior During Therapy [facility] for tasks assessed/performed           Past Medical History:  Diagnosis Date  . Asthma 1970's - 1990's   a. ? 2/2 wood stove usage.  . Basal cell carcinoma of skin of nose   . Diverticulosis   . Dysrhythmia    A-FIB  . Goiter   . History of chicken pox   . Hx of colonic polyps   . Hypertension   . Hypothyroidism   . Menopause   . PAF (paroxysmal atrial fibrillation) ([place])    on apixoban  . Sinus node dysfunction (HCC)    a. s/p MDT dual chamber pacemaker  . Small bowel obstruction ([place]) 08/2009    Past Surgical History:  Procedure Laterality Date  . CATARACT EXTRACTION  02/2019  . CATARACT EXTRACTION  04/2019  . COLONOSCOPY    . COLONOSCOPY WITH PROPOFOL N/A 12/02/2018   Procedure: COLONOSCOPY WITH PROPOFOL;  Surgeon: Lollie Sails, MD;  Location: [facility] ENDOSCOPY;  Service: Endoscopy;  Laterality: N/A;  . EP IMPLANTABLE DEVICE N/A 06/22/2015   MDT MRI compatible dual chamber PPM implanted by Dr Caryl Comes for symptomatic bradycardia  . INSERT / REPLACE / REMOVE PACEMAKER      There were no vitals filed for this visit.   Subjective Assessment - 09/12/20 0955    Subjective Neck is back are very good.    Pertinent History Cervicalgia, lumbago with sciatica. Neck is currently  bothering her the most. Neck pain began about 3 years ago. Sitting and doing nothing, pt is fine. However when pt performs chores such as cooking and cleaning or yardwork, or walking a while. Gradual onset of pain. Heating pad used to help. Stopped using heating pad because she uses aspercreme and she's not supposed to put a heating pad on it. Also has a pacemaker since 5 years ago.  Had x-ray for back and was told that her spine was crooked. Pt was also told that she might have had a fracture in her low back, also has arthritis, degenerative discs, spurring in her spine.  Low back pain has been bothering her on and off for the past 5-6 years. Denies loss of bowel or bladder control or LE paresthesia.  Sudden onset of back pain, unknown method of injury.  No falls within the past 6 months. No fear of falling.    Currently in Pain? No/denies    Pain Score 0-No pain                                     PT Education - 09/12/20 1001    Education Details  ther-ex    Person(s) Educated Patient    Methods Explanation;Demonstration;Tactile cues;Verbal cues    Comprehension Returned demonstration;Verbalized understanding            MedbridgeAccess Code [email or web address]  Blood pressure is controlled per pt No latex band allergies  Cervical symptoms along C5 dermatome proximally  PACEMAKER  Observation: pt tendency to protract neck.   Movementdirectionpreference for low back: R side bend    Therapeutic Exercise   Manually resisted scapular retraction isometrics, targeting lower trap              L 10x3 with 5 seconds   Seated manual L shift isometrics in neutral, PT manual resistance 10x3 with 5 second holds   Chin tucks 10x2 with 5 second holds  Decreased B upper trap tensino palpated   seated hip IR/piriformis stretch R 30 seconds x 3 L 30 seconds x 3   sitting Hip IR at 90/90 PROM  R 10x3 L  10x3  SLS with B UE assist R 10x5 seconds for 2 sets  L 10x5 seconds for 2 sets  Standing hip abduction with B UE assist yellow band   R 10x2  L 10x2   Improved exercise technique, movement at target joints, use of target muscles aftermin tomod verbal, visual, tactile cues.     Response to treatment/ clinical impression Pt making very good progress with neck and back pain based on subjective reports of both areas doing well. Continued working on thoracic extension, lower trap strengthening, and glute strength to help continue progress. Pt tolerated session well without aggravation of symptoms. Pt will benefit from continued skilled physical therapy services to continue to decrease pain, improve strength and function.       PT Short Term Goals - 07/25/20 1512      PT SHORT TERM GOAL #1   Title Patient will be independent with her HEP to decrease pain, improve cervical and lumbar AROM, improve strength and function.    Baseline Pt has started her HEP. (06/15/2020); Pt performing HEP, no questions (07/25/2020)    Time 3    Period Weeks    Status Achieved    Target Date 07/07/20             PT Long Term Goals - 09/07/20 0949      PT LONG TERM GOAL #1   Title Patient will have a decrease in neck pain to 5/10 or less at worst to promote ability to perform chores as well as ambulate with less difficulty.    Baseline 10/10 at worst for the past 3 months (06/15/2020); 7/10 at most for the past 7 days (07/25/2020); 2/10 neck pain at most for the past 7 days (09/07/2020)    Time 8    Period Weeks    Status Achieved    Target Date 10/06/20      PT LONG TERM GOAL #2   Title Patient will have a decrease in low back pain to 2/10 or less at most to promote ability to peform chores, as well as ambulate more comfortably.    Baseline 5/10 low back pain at most for the past 3 months (06/15/2020); 5/10 at most for the past 7 days (07/25/2020); 08/11/20 (3/10);  3/10 at most for the past 7 days (09/07/2020)    Time 3    Period Weeks    Status Partially Met    Target Date 10/06/20      PT LONG TERM GOAL #3   Title Patient will  improve bilateral hip extension and abduction strength by at least 1/2 MMT grade to promote ability to ambulate and perform chores with less back pain.    Time 8    Period Weeks    Status Achieved      PT LONG TERM GOAL #4   Title Patient will improve her lumbar spine FOTO by at least 10 points as a demonstration of improved function.    Baseline Lumbar spine FOTO: 49 (06/15/2020); 44 (07/20/2020); 52 (08/11/20)    Time 8    Period Weeks    Status On-going      PT LONG TERM GOAL #5   Title Pt to demonstrate 10 degrees improvement in cervical rotation bilat, 10 degrees improvement in cervical extension, and 5 dgerees improvement in sidebending bilat to improve funcitonal mobility for driving, kitchen tasks, and grooming.    Baseline all limited >60%;  55 degrees R and L cervical rotation (R posterior lateral neck pull with L cervical rotation), cervical extension 50 degrees (no symptoms), cervical flexion 23 degrees with slight pulling, R cervical side bend 18 degrees with L neck pull, L cervical side bend 17 degrees with L neck pulling (09/07/2020)    Time 8    Period Weeks    Status On-going    Target Date 10/06/20                 Plan - 09/12/20 1627    Clinical Impression Statement Pt making very good progress with neck and back pain based on subjective reports of both areas doing well. Continued working on thoracic extension, lower trap strengthening, and glute strength to help continue progress. Pt tolerated session well without aggravation of symptoms. Pt will benefit from continued skilled physical therapy services to continue to decrease pain, improve strength and function.    Personal Factors and Comorbidities Comorbidity 3+;Age;Fitness;Past/Current Experience;Time since onset of injury/illness/exacerbation     Comorbidities Pacemaker, HTN, dysrhythmia    Examination-Activity Limitations Bed Mobility;Dressing;Transfers;Bend;Stairs;Carry    Stability/Clinical Decision Making Stable/Uncomplicated    Rehab Potential Good    PT Frequency 2x / week    PT Duration 4 weeks    PT Treatment/Interventions Neuromuscular re-education;Therapeutic activities;Therapeutic exercise;Functional mobility training;Patient/family education;Manual techniques;Dry needling;Aquatic Therapy;Electrical Stimulation;Iontophoresis 32m/ml Dexamethasone;Traction;Gait training;Stair training;Balance training    PT Next Visit Plan thoracic extension, scapular, trunk, hip strengthening, posture, manual techniques, modalities PRN    PT Home Exercise Plan Medbridge Access Code [email or web address]   Consulted and Agree with Plan of Care Patient           Patient will benefit from skilled therapeutic intervention in order to improve the following deficits and impairments:  Pain, Postural dysfunction, Improper body mechanics, Difficulty walking, Decreased strength, Abnormal gait  Visit Diagnosis: Muscle weakness (generalized)  Chronic bilateral low back pain, unspecified whether sciatica present  Difficulty in walking, not elsewhere classified  Cervicalgia     Problem List Patient Active Problem List   Diagnosis Date Noted  . Acute hyponatremia 06/26/2019  . Generalized weakness 12/07/2018  . Neck pain 05/13/2017  . [place]us [place] dysfunction ([place] 06/22/2015  . PAF (paroxysmal atrial fibrillation) ([place] 06/11/2014  . URI (upper respiratory infection) 10/21/2013  . Mixed hyperlipidemia 02/06/2012    Valeri Sula 09/12/2020, 4:31 PM  [facility] [place] [facility] [address] [place] [address]Phone: [phone]  Fax:  [phone] Name: CRUTHANNA MACCHIAMRN: [id number] of Birth: [birth year]

## 2020-09-13 ENCOUNTER — Telehealth: Payer: Self-pay | Admitting: Cardiovascular Disease

## 2020-09-13 NOTE — Telephone Encounter (Signed)
error 

## 2020-09-13 NOTE — Telephone Encounter (Signed)
   [facility] Pre-operative Risk Assessment    HEARTCARE STAFF: - Please ensure there is not already an duplicate clearance open for this procedure. - Under Visit Info/Reason for Call, type in Other and utilize the format Clearance MM/DD/YY or Clearance TBD. Do not use dashes or single digits. - If request is for dental extraction, please clarify the # of teeth to be extracted.  Request for surgical clearance:  1. What type of surgery is being performed? Upper eyelid blepharoplasty and ptosis repair  2. When is this surgery scheduled? 09/29/20  3. What type of clearance is required (medical clearance vs. Pharmacy clearance to hold med vs. Both)? pharm  4. Are there any medications that need to be held prior to surgery and how long?stop eliquis 2 days prior and restart 5 days after  5. Practice name and name of physician performing surgery? [facility]  Dr Janalee Dane  6. What is the office phone number? [phone]   7.   What is the office fax number? [phone]  8.   Anesthesia type (None, local, MAC, general) ? 2 hrs of general   Marykay Lex 09/13/2020, 8:54 AM  _________________________________________________________________   (provider comments below)

## 2020-09-13 NOTE — Telephone Encounter (Signed)
Patient with diagnosis of A Fib on Eliquis for anticoagulation.    Procedure: Upper eyelid blepharoplasty and ptosis repair Date of procedure: 09/29/20  CHADS2-VASc score of  4 (HTN, AGE x 2, female)  CrCl 51.02 mL/min using adjusted body weight Platelet count 221K  Would typically hold Eliquis for 2-3 days prior to procedure.  However, surgeon requested 7 day hold (2 days prior and 5 days post).  Will forward to Dr. Rockey Situ for input.

## 2020-09-14 NOTE — Telephone Encounter (Signed)
° °  Primary Cardiologist: Ida Rogue, MD  Chart reviewed as part of pre-operative protocol coverage. Given past medical history and time since last visit, based on ACC/AHA guidelines, TRENDA CORLISS would be at acceptable risk for the planned procedure without further cardiovascular testing.   She may hold her Plavix for 2 days prior to procedure and resume 5 days after procedure.  I will route this recommendation to the requesting party via Epic fax function and remove from pre-op pool.  Please call with questions.  Jossie Ng. Fedor Kazmierski NP-C    09/14/2020, 4:44 PM [facility] [place] [address] Office [phone] Fax [phone]

## 2020-09-14 NOTE — Telephone Encounter (Signed)
Acceptable risk to hold anticoagulation 2 days prior to procedure, 5 days following On pacer downloads there is minimal atrial fibrillation recorded

## 2020-09-21 ENCOUNTER — Other Ambulatory Visit: Payer: Self-pay

## 2020-09-21 ENCOUNTER — Ambulatory Visit: Payer: PPO

## 2020-09-21 DIAGNOSIS — G8929 Other chronic pain: Secondary | ICD-10-CM

## 2020-09-21 DIAGNOSIS — M6281 Muscle weakness (generalized): Secondary | ICD-10-CM

## 2020-09-21 DIAGNOSIS — M545 Low back pain, unspecified: Secondary | ICD-10-CM

## 2020-09-21 DIAGNOSIS — R262 Difficulty in walking, not elsewhere classified: Secondary | ICD-10-CM

## 2020-09-21 DIAGNOSIS — M542 Cervicalgia: Secondary | ICD-10-CM

## 2020-09-21 NOTE — Therapy (Signed)
[facility] [address], [place], [address] Phone: [phone]   Fax:  [phone]  Physical Therapy Treatment  Patient Details  Name: Hailey Simmons MRN: [id number] Date of Birth: 01/12/1936 Referring Provider (PT): Kary Kos, MD   Encounter Date: 09/21/2020   PT End of Session - 09/21/20 1119    Visit Number 22    Number of Visits 33    Date for PT Re-Evaluation 10/06/20    Authorization Type 2    Authorization Time Period of 10 progress report    PT Start Time 1119    PT Stop Time 1158    PT Time Calculation (min) 39 min    Activity Tolerance Patient tolerated treatment well    Behavior During Therapy [facility] for tasks assessed/performed           Past Medical History:  Diagnosis Date   Asthma 1970's - 1990's   a. ? 2/2 wood stove usage.   Basal cell carcinoma of skin of nose    Diverticulosis    Dysrhythmia    A-FIB   Goiter    History of chicken pox    Hx of colonic polyps    Hypertension    Hypothyroidism    Menopause    PAF (paroxysmal atrial fibrillation) ([place])    on apixoban   Sinus node dysfunction ([place])    a. s/p MDT dual chamber pacemaker   Small bowel obstruction ([place]) 08/2009    Past Surgical History:  Procedure Laterality Date   CATARACT EXTRACTION  02/2019   CATARACT EXTRACTION  04/2019   COLONOSCOPY     COLONOSCOPY WITH PROPOFOL N/A 12/02/2018   Procedure: COLONOSCOPY WITH PROPOFOL;  Surgeon: Lollie Sails, MD;  Location: [facility] ENDOSCOPY;  Service: Endoscopy;  Laterality: N/A;   EP IMPLANTABLE DEVICE N/A 06/22/2015   MDT MRI compatible dual chamber PPM implanted by Dr Caryl Comes for symptomatic bradycardia   INSERT / REPLACE / REMOVE PACEMAKER      There were no vitals filed for this visit.   Subjective Assessment - 09/21/20 1119    Subjective Neck and back are doing good. Much better than it was.    Pertinent History Cervicalgia, lumbago with  sciatica. Neck is currently bothering her the most. Neck pain began about 3 years ago. Sitting and doing nothing, pt is fine. However when pt performs chores such as cooking and cleaning or yardwork, or walking a while. Gradual onset of pain. Heating pad used to help. Stopped using heating pad because she uses aspercreme and she's not supposed to put a heating pad on it. Also has a pacemaker since 5 years ago.  Had x-ray for back and was told that her spine was crooked. Pt was also told that she might have had a fracture in her low back, also has arthritis, degenerative discs, spurring in her spine.  Low back pain has been bothering her on and off for the past 5-6 years. Denies loss of bowel or bladder control or LE paresthesia.  Sudden onset of back pain, unknown method of injury.  No falls within the past 6 months. No fear of falling.    Currently in Pain? No/denies                                     PT Education - 09/21/20 1149    Education Details ther-ex  Person(s) Educated Patient    Methods Explanation;Demonstration;Tactile cues;Verbal cues    Comprehension Returned demonstration;Verbalized understanding          MedbridgeAccess Code [email or web address]  Blood pressure is controlled per pt No latex band allergies  Cervical symptoms along C5 dermatome proximally  PACEMAKER  Observation: pt tendency to protract neck.   Movementdirectionpreference for low back: R side bend    Therapeutic Exercise   Manually resisted scapular retraction isometrics, targeting lower trap  L 10x3 with 5 seconds   Seated manual L shift isometrics in neutral, PT manual resistance 10x3 with 5 second holds   Seated manually resisted hip extension   R 10x5 seconds for 3 sets  L 10x5 seconds for 3 sets  Side stepping 30 ft to the R and 30 ft to the L for 2 sets to promote glute med muscle strengthening   Standing open books at corner wall 10x3 to  promote thoracic extension   SLS with B UE assist to promote glute med muscle strength  R 10x5 secondsfor 2 sets L 10x5 secondsfor 2 sets    Improved exercise technique, movement at target joints, use of target muscles aftermin tomod verbal, visual, tactile cues.     Response to treatment/ clinical impression Pt continues to do well with neck and back pain based on subjective reports. Continued working on improving thoracic extension, posture, and hip strength to help decrease stress to low back and cervical spine when ambulating or performing standing tasks. Pt tolerated session well without aggravation of symptoms. Pt will benefit from continued skilled physical therapy services to decrease pain, improve strength and function.       PT Short Term Goals - 07/25/20 1512      PT SHORT TERM GOAL #1   Title Patient will be independent with her HEP to decrease pain, improve cervical and lumbar AROM, improve strength and function.    Baseline Pt has started her HEP. (06/15/2020); Pt performing HEP, no questions (07/25/2020)    Time 3    Period Weeks    Status Achieved    Target Date 07/07/20             PT Long Term Goals - 09/07/20 0949      PT LONG TERM GOAL #1   Title Patient will have a decrease in neck pain to 5/10 or less at worst to promote ability to perform chores as well as ambulate with less difficulty.    Baseline 10/10 at worst for the past 3 months (06/15/2020); 7/10 at most for the past 7 days (07/25/2020); 2/10 neck pain at most for the past 7 days (09/07/2020)    Time 8    Period Weeks    Status Achieved    Target Date 10/06/20      PT LONG TERM GOAL #2   Title Patient will have a decrease in low back pain to 2/10 or less at most to promote ability to peform chores, as well as ambulate more comfortably.    Baseline 5/10 low back pain at most for the past 3 months (06/15/2020); 5/10 at most for the past 7 days (07/25/2020);  08/11/20 (3/10); 3/10 at most for the past 7 days (09/07/2020)    Time 3    Period Weeks    Status Partially Met    Target Date 10/06/20      PT LONG TERM GOAL #3   Title Patient will improve bilateral hip extension and abduction strength by at least 1/2 MMT  grade to promote ability to ambulate and perform chores with less back pain.    Time 8    Period Weeks    Status Achieved      PT LONG TERM GOAL #4   Title Patient will improve her lumbar spine FOTO by at least 10 points as a demonstration of improved function.    Baseline Lumbar spine FOTO: 49 (06/15/2020); 44 (07/20/2020); 52 (08/11/20)    Time 8    Period Weeks    Status On-going      PT LONG TERM GOAL #5   Title Pt to demonstrate 10 degrees improvement in cervical rotation bilat, 10 degrees improvement in cervical extension, and 5 dgerees improvement in sidebending bilat to improve funcitonal mobility for driving, kitchen tasks, and grooming.    Baseline all limited >60%;  55 degrees R and L cervical rotation (R posterior lateral neck pull with L cervical rotation), cervical extension 50 degrees (no symptoms), cervical flexion 23 degrees with slight pulling, R cervical side bend 18 degrees with L neck pull, L cervical side bend 17 degrees with L neck pulling (09/07/2020)    Time 8    Period Weeks    Status On-going    Target Date 10/06/20                 Plan - 09/21/20 1126    Clinical Impression Statement Pt continues to do well with neck and back pain based on subjective reports. Continued working on improving thoracic extension, posture, and hip strength to help decrease stress to low back and cervical spine when ambulating or performing standing tasks. Pt tolerated session well without aggravation of symptoms. Pt will benefit from continued skilled physical therapy services to decrease pain, improve strength and function.    Personal Factors and Comorbidities Comorbidity 3+;Age;Fitness;Past/Current Experience;Time since  onset of injury/illness/exacerbation    Comorbidities Pacemaker, HTN, dysrhythmia    Examination-Activity Limitations Bed Mobility;Dressing;Transfers;Bend;Stairs;Carry    Stability/Clinical Decision Making Stable/Uncomplicated    Rehab Potential Good    PT Frequency 2x / week    PT Duration 4 weeks    PT Treatment/Interventions Neuromuscular re-education;Therapeutic activities;Therapeutic exercise;Functional mobility training;Patient/family education;Manual techniques;Dry needling;Aquatic Therapy;Traction;Gait training;Stair training;Balance training    PT Next Visit Plan thoracic extension, scapular, trunk, hip strengthening, posture, manual techniques, modalities PRN    PT Home Exercise Plan Medbridge Access Code [email or web address]    Consulted and Agree with Plan of Care Patient           Patient will benefit from skilled therapeutic intervention in order to improve the following deficits and impairments:  Pain, Postural dysfunction, Improper body mechanics, Difficulty walking, Decreased strength, Abnormal gait  Visit Diagnosis: Muscle weakness (generalized)  Chronic bilateral low back pain, unspecified whether sciatica present  Difficulty in walking, not elsewhere classified  Cervicalgia     Problem List Patient Active Problem List   Diagnosis Date Noted   Acute hyponatremia 06/26/2019   Generalized weakness 12/07/2018   Neck pain 05/13/2017   Sinus node dysfunction ([place]) 06/22/2015   PAF (paroxysmal atrial fibrillation) ([place]) 06/11/2014   URI (upper respiratory infection) 10/21/2013   Mixed hyperlipidemia 02/06/2012    Joneen Boers PT, DPT   09/21/2020, 12:05 PM  [facility] [address] [place], [address] Phone: [phone]   Fax:  [phone]  Name: DAMYA COMLEY MRN: [id number] Date of Birth: 10-06-1936

## 2020-09-23 NOTE — Telephone Encounter (Signed)
Spoke with patient and she states that due to her having surgery next week she would rather wait to make any changes at this time because it does help. Reviewed provider recommendations and she is just fearful that she will not be able to have her procedure if BP is elevated. So she would like to remain on it for now and once she has her surgery and things settle down then she will call us back to review options. She states this has helped more with her blood pressures than other medications. Reviewed that she should give [place] a call if she needs any recommendations. She verbalized understanding of our conversation, agreement of plan, with no further questions at this time.

## 2020-09-23 NOTE — Telephone Encounter (Signed)
Did holding the diltiazem helped her fatigue? Is fatigued from some other cause such as inactivity through Covid past 2 years? Options include restart the diltiazem at 240 as she was taking or We can try a lower dose 120 even 180 mg Other option will be to change medication, such as amlodipine 5 mg up to 10 mg as tolerated

## 2020-09-26 ENCOUNTER — Other Ambulatory Visit: Payer: Self-pay

## 2020-09-26 ENCOUNTER — Ambulatory Visit
Admission: RE | Admit: 2020-09-26 | Discharge: 2020-09-26 | Disposition: A | Discharge status: Home / Self Care | Payer: PPO | Source: Ambulatory Visit | Attending: Internal Medicine | Admitting: Internal Medicine

## 2020-09-26 DIAGNOSIS — Z136 Encounter for screening for cardiovascular disorders: Secondary | ICD-10-CM | POA: Diagnosis not present

## 2020-09-26 DIAGNOSIS — Z79899 Other long term (current) drug therapy: Secondary | ICD-10-CM | POA: Insufficient documentation

## 2020-09-26 DIAGNOSIS — I480 Paroxysmal atrial fibrillation: Secondary | ICD-10-CM | POA: Diagnosis not present

## 2020-09-27 DIAGNOSIS — Z20822 Contact with and (suspected) exposure to covid-19: Secondary | ICD-10-CM | POA: Diagnosis not present

## 2020-09-27 DIAGNOSIS — Z01812 Encounter for preprocedural laboratory examination: Secondary | ICD-10-CM | POA: Diagnosis not present

## 2020-09-27 DIAGNOSIS — Z01818 Encounter for other preprocedural examination: Secondary | ICD-10-CM | POA: Diagnosis not present

## 2020-09-28 ENCOUNTER — Ambulatory Visit: Payer: PPO

## 2020-09-28 DIAGNOSIS — M542 Cervicalgia: Secondary | ICD-10-CM

## 2020-09-28 DIAGNOSIS — Z9841 Cataract extraction status, right eye: Secondary | ICD-10-CM | POA: Diagnosis not present

## 2020-09-28 DIAGNOSIS — Z950 Presence of cardiac pacemaker: Secondary | ICD-10-CM | POA: Diagnosis not present

## 2020-09-28 DIAGNOSIS — M6281 Muscle weakness (generalized): Secondary | ICD-10-CM

## 2020-09-28 DIAGNOSIS — E7800 Pure hypercholesterolemia, unspecified: Secondary | ICD-10-CM | POA: Diagnosis not present

## 2020-09-28 DIAGNOSIS — I10 Essential (primary) hypertension: Secondary | ICD-10-CM | POA: Diagnosis not present

## 2020-09-28 DIAGNOSIS — H02834 Dermatochalasis of left upper eyelid: Secondary | ICD-10-CM | POA: Diagnosis not present

## 2020-09-28 DIAGNOSIS — H5340 Unspecified visual field defects: Secondary | ICD-10-CM | POA: Diagnosis not present

## 2020-09-28 DIAGNOSIS — E039 Hypothyroidism, unspecified: Secondary | ICD-10-CM | POA: Diagnosis not present

## 2020-09-28 DIAGNOSIS — Z9842 Cataract extraction status, left eye: Secondary | ICD-10-CM | POA: Diagnosis not present

## 2020-09-28 DIAGNOSIS — G8929 Other chronic pain: Secondary | ICD-10-CM

## 2020-09-28 DIAGNOSIS — R262 Difficulty in walking, not elsewhere classified: Secondary | ICD-10-CM

## 2020-09-28 DIAGNOSIS — H53453 Other localized visual field defect, bilateral: Secondary | ICD-10-CM | POA: Diagnosis not present

## 2020-09-28 DIAGNOSIS — H02831 Dermatochalasis of right upper eyelid: Secondary | ICD-10-CM | POA: Diagnosis not present

## 2020-09-28 DIAGNOSIS — I4891 Unspecified atrial fibrillation: Secondary | ICD-10-CM | POA: Diagnosis not present

## 2020-09-28 DIAGNOSIS — Z7901 Long term (current) use of anticoagulants: Secondary | ICD-10-CM | POA: Diagnosis not present

## 2020-09-28 DIAGNOSIS — H02403 Unspecified ptosis of bilateral eyelids: Secondary | ICD-10-CM | POA: Diagnosis not present

## 2020-09-28 DIAGNOSIS — Z79899 Other long term (current) drug therapy: Secondary | ICD-10-CM | POA: Diagnosis not present

## 2020-09-28 DIAGNOSIS — L987 Excessive and redundant skin and subcutaneous tissue: Secondary | ICD-10-CM | POA: Diagnosis not present

## 2020-09-28 NOTE — Therapy (Signed)
[facility] [address], [place], [address] Phone: [phone]   Fax:  [phone]  Physical Therapy Discharge Summary  Patient Details  Name: Hailey Simmons MRN: [id number] Date of Birth: 1936/08/30 Referring Provider (PT): Kary Kos, MD   Encounter Date: 09/28/2020    Past Medical History:  Diagnosis Date   Asthma 1970's - 1990's   a. ? 2/2 wood stove usage.   Basal cell carcinoma of skin of nose    Diverticulosis    Dysrhythmia    A-FIB   Goiter    History of chicken pox    Hx of colonic polyps    Hypertension    Hypothyroidism    Menopause    PAF (paroxysmal atrial fibrillation) ([place])    on apixoban   Sinus node dysfunction ([place])    a. s/p MDT dual chamber pacemaker   Small bowel obstruction ([place]) 08/2009    Past Surgical History:  Procedure Laterality Date   CATARACT EXTRACTION  02/2019   CATARACT EXTRACTION  04/2019   COLONOSCOPY     COLONOSCOPY WITH PROPOFOL N/A 12/02/2018   Procedure: COLONOSCOPY WITH PROPOFOL;  Surgeon: Lollie Sails, MD;  Location: [facility] ENDOSCOPY;  Service: Endoscopy;  Laterality: N/A;   EP IMPLANTABLE DEVICE N/A 06/22/2015   MDT MRI compatible dual chamber PPM implanted by Dr Caryl Comes for symptomatic bradycardia   INSERT / REPLACE / REMOVE PACEMAKER      There were no vitals filed for this visit.   Subjective Assessment - 09/28/20 1805    Subjective Per phone conversation, pt had to discharge secondary to getting eye lid surgery. 2/10 back pain at most for the past 7 days (09/27/2020)    Pertinent History Cervicalgia, lumbago with sciatica. Neck is currently bothering her the most. Neck pain began about 3 years ago. Sitting and doing nothing, pt is fine. However when pt performs chores such as cooking and cleaning or yardwork, or walking a while. Gradual onset of pain. Heating pad used to help. Stopped using heating pad because she uses  aspercreme and she's not supposed to put a heating pad on it. Also has a pacemaker since 5 years ago.  Had x-ray for back and was told that her spine was crooked. Pt was also told that she might have had a fracture in her low back, also has arthritis, degenerative discs, spurring in her spine.  Low back pain has been bothering her on and off for the past 5-6 years. Denies loss of bowel or bladder control or LE paresthesia.  Sudden onset of back pain, unknown method of injury.  No falls within the past 6 months. No fear of falling.                                    Pt demonstrates significant decrease in neck pain, decrease in low back pain, improved B hip strength, and significant improvement in function since initial evaluation. Pt has made very good progress with PT towards goals and demonstrates independence with her home exercise program. Skilled physical therapy services discharged with pt continuing progress with her exercises at home.      PT Short Term Goals - 07/25/20 1512      PT SHORT TERM GOAL #1   Title Patient will be independent with her HEP to decrease pain, improve cervical and lumbar AROM, improve strength and function.  Baseline Pt has started her HEP. (06/15/2020); Pt performing HEP, no questions (07/25/2020)    Time 3    Period Weeks    Status Achieved    Target Date 07/07/20             PT Long Term Goals - 09/28/20 1806      PT LONG TERM GOAL #1   Title Patient will have a decrease in neck pain to 5/10 or less at worst to promote ability to perform chores as well as ambulate with less difficulty.    Baseline 10/10 at worst for the past 3 months (06/15/2020); 7/10 at most for the past 7 days (07/25/2020); 2/10 neck pain at most for the past 7 days (09/07/2020)    Time 8    Period Weeks    Status Achieved      PT LONG TERM GOAL #2   Title Patient will have a decrease in low back pain to 2/10 or less at most to promote ability to peform  chores, as well as ambulate more comfortably.    Baseline 5/10 low back pain at most for the past 3 months (06/15/2020); 5/10 at most for the past 7 days (07/25/2020); 08/11/20 (3/10); 3/10 at most for the past 7 days (09/07/2020); 2/10 Low back pain at most for the past 7 days (09/27/2020)    Time 3    Period Weeks    Status Achieved    Target Date 10/06/20      PT LONG TERM GOAL #3   Title Patient will improve bilateral hip extension and abduction strength by at least 1/2 MMT grade to promote ability to ambulate and perform chores with less back pain.    Time 8    Period Weeks    Status Achieved      PT LONG TERM GOAL #4   Title Patient will improve her lumbar spine FOTO by at least 10 points as a demonstration of improved function.    Baseline Lumbar spine FOTO: 49 (06/15/2020); 44 (07/20/2020); 52 (08/11/20); 63 (09/27/2020)    Time 8    Period Weeks    Status Achieved    Target Date 10/06/20      PT LONG TERM GOAL #5   Title Pt to demonstrate 10 degrees improvement in cervical rotation bilat, 10 degrees improvement in cervical extension, and 5 dgerees improvement in sidebending bilat to improve funcitonal mobility for driving, kitchen tasks, and grooming.    Baseline all limited >60%;  55 degrees R and L cervical rotation (R posterior lateral neck pull with L cervical rotation), cervical extension 50 degrees (no symptoms), cervical flexion 23 degrees with slight pulling, R cervical side bend 18 degrees with L neck pull, L cervical side bend 17 degrees with L neck pulling (09/07/2020)    Time 8    Period Weeks    Status Unable to assess    Target Date 10/06/20                 Plan - 09/28/20 1810    Clinical Impression Statement Pt demonstrates significant decrease in neck pain, decrease in low back pain, improved B hip strength, and significant improvement in function since initial evaluation. Pt has made very good progress with PT towards goals and demonstrates independence with  her home exercise program. Skilled physical therapy services discharged with pt continuing progress with her exercises at home.    Personal Factors and Comorbidities Comorbidity 3+;Age;Fitness;Past/Current Experience;Time since onset of injury/illness/exacerbation  Comorbidities Pacemaker, HTN, dysrhythmia    Stability/Clinical Decision Making Stable/Uncomplicated    Clinical Decision Making Low    Rehab Potential Good    PT Treatment/Interventions Neuromuscular re-education;Therapeutic activities;Therapeutic exercise;Patient/family education;Manual techniques    PT Next Visit Plan continue progress with her [place] Access Code [email or web address]    Consulted and Agree with Plan of Care Patient           Patient will benefit from skilled therapeutic intervention in order to improve the following deficits and impairments:  Postural dysfunction, Improper body mechanics, Abnormal gait  Visit Diagnosis: Muscle weakness (generalized)  Chronic bilateral low back pain, unspecified whether sciatica present  Difficulty in walking, not elsewhere classified  Cervicalgia     Problem List Patient Active Problem List   Diagnosis Date Noted   Acute hyponatremia 06/26/2019   Generalized weakness 12/07/2018   Neck pain 05/13/2017   Sinus node dysfunction ([place]) 06/22/2015   PAF (paroxysmal atrial fibrillation) ([place]) 06/11/2014   URI (upper respiratory infection) 10/21/2013   Mixed hyperlipidemia 02/06/2012    Thank you for your referral.  Joneen Boers PT, DPT   09/28/2020, 6:12 PM  [facility] [address] [place], [address] Phone: [phone]   Fax:  [phone]  Name: Hailey Simmons MRN: [id number] Date of Birth: December 09, 1935

## 2020-10-05 ENCOUNTER — Telehealth: Payer: Self-pay | Admitting: Internal Medicine

## 2020-10-05 DIAGNOSIS — I480 Paroxysmal atrial fibrillation: Secondary | ICD-10-CM

## 2020-10-05 DIAGNOSIS — Z79899 Other long term (current) drug therapy: Secondary | ICD-10-CM

## 2020-10-05 MED ORDER — FLECAINIDE ACETATE 100 MG PO TABS
50.0000 mg | ORAL_TABLET | Freq: Two times a day (BID) | ORAL | Status: DC
Start: 2020-10-05 — End: 2020-12-13

## 2020-10-05 NOTE — Telephone Encounter (Signed)
Late entry:  I spoke with the patient on Friday 09/30/20 regarding Dr. Olin Pia recommendations per a MyChart message sent to her last week after her EKG was done on 09/26/20:  - Decrease the flecainide further to 50 mg twice daily  - Repeat the EKG in 2 weeks after the dose decrease  I also spoke with him further about your blood pressure and he said if you felt better at all off of the diltiazem, he would rather try you on an alternative so you feel good, while still trying to control the blood pressure.  He recommended a medication called: - Verapamil 180 mg once daily   Per the patient, she is agreeable with decreasing flecainide to 50 mg BID. She advised she has flecainide 100 mg tablets at home and will take 0.5 tablet (50 mg) BID. She would like to defer the verapamil at this time and see how she feels back on the diltiazem. She is aware to contact the office should her symptoms of fatigue return/ worsen with restarting this.   She was made aware I would reach out to her this week to schedule a follow up EKG in ~ 2 weeks.

## 2020-10-05 NOTE — Telephone Encounter (Signed)
MyChart message sent to the patient about scheduling a follow up EKG on 11/12 or 11/15.

## 2020-10-10 ENCOUNTER — Other Ambulatory Visit: Payer: Self-pay | Admitting: *Deleted

## 2020-10-10 DIAGNOSIS — I480 Paroxysmal atrial fibrillation: Secondary | ICD-10-CM

## 2020-10-10 DIAGNOSIS — Z79899 Other long term (current) drug therapy: Secondary | ICD-10-CM

## 2020-10-10 NOTE — Progress Notes (Signed)
EKG order placed for 10/17/20 at [facility].

## 2020-10-12 NOTE — Telephone Encounter (Signed)
The patient is scheduled for a follow up EKG on Monday 10/17/20- she was made aware of this via her MyChart.

## 2020-10-13 ENCOUNTER — Ambulatory Visit (INDEPENDENT_AMBULATORY_CARE_PROVIDER_SITE_OTHER): Payer: PPO

## 2020-10-13 DIAGNOSIS — I495 Sick sinus syndrome: Secondary | ICD-10-CM | POA: Diagnosis not present

## 2020-10-15 LAB — CUP PACEART REMOTE DEVICE CHECK
Battery Remaining Longevity: 62 mo
Battery Voltage: 3 V
Brady Statistic AP VP Percent: 0.13 %
Brady Statistic AP VS Percent: 77.7 %
Brady Statistic AS VP Percent: 4.23 %
Brady Statistic AS VS Percent: 17.93 %
Brady Statistic RA Percent Paced: 76.9 %
Brady Statistic RV Percent Paced: 4.35 %
Date Time Interrogation Session: 20211112174334
Implantable Lead Implant Date: 20160720
Implantable Lead Implant Date: 20160720
Implantable Lead Location: 753859
Implantable Lead Location: 753860
Implantable Lead Model: 5076
Implantable Lead Model: 5076
Implantable Pulse Generator Implant Date: 20160720
Lead Channel Impedance Value: 323 Ohm
Lead Channel Impedance Value: 437 Ohm
Lead Channel Impedance Value: 494 Ohm
Lead Channel Impedance Value: 513 Ohm
Lead Channel Pacing Threshold Amplitude: 0.75 V
Lead Channel Pacing Threshold Amplitude: 0.75 V
Lead Channel Pacing Threshold Pulse Width: 0.4 ms
Lead Channel Pacing Threshold Pulse Width: 0.4 ms
Lead Channel Sensing Intrinsic Amplitude: 0.5 mV
Lead Channel Sensing Intrinsic Amplitude: 0.5 mV
Lead Channel Sensing Intrinsic Amplitude: 9.375 mV
Lead Channel Sensing Intrinsic Amplitude: 9.375 mV
Lead Channel Setting Pacing Amplitude: 1.5 V
Lead Channel Setting Pacing Amplitude: 2.5 V
Lead Channel Setting Pacing Pulse Width: 0.4 ms
Lead Channel Setting Sensing Sensitivity: 1.2 mV

## 2020-10-17 ENCOUNTER — Ambulatory Visit
Admission: RE | Admit: 2020-10-17 | Discharge: 2020-10-17 | Disposition: A | Discharge status: Home / Self Care | Payer: PPO | Source: Ambulatory Visit | Attending: Internal Medicine | Admitting: Internal Medicine

## 2020-10-17 ENCOUNTER — Other Ambulatory Visit: Payer: Self-pay

## 2020-10-17 DIAGNOSIS — R54 Age-related physical debility: Secondary | ICD-10-CM | POA: Diagnosis not present

## 2020-10-17 DIAGNOSIS — Z79899 Other long term (current) drug therapy: Secondary | ICD-10-CM | POA: Diagnosis not present

## 2020-10-17 DIAGNOSIS — Z5181 Encounter for therapeutic drug level monitoring: Secondary | ICD-10-CM | POA: Insufficient documentation

## 2020-10-17 DIAGNOSIS — I4891 Unspecified atrial fibrillation: Secondary | ICD-10-CM | POA: Insufficient documentation

## 2020-10-17 NOTE — Progress Notes (Signed)
Remote pacemaker transmission.   

## 2020-10-25 ENCOUNTER — Telehealth: Payer: Self-pay | Admitting: Internal Medicine

## 2020-10-25 NOTE — Telephone Encounter (Signed)
Dr. Caryl Comes reviewed the patient's EKG from 10/17/20 today while in clinic. QTc- down to 466 ms (from 506 ms on 09/26/20).  Per Dr. Caryl Comes, this is acceptable and the patient should continue on her current dose of flecainide 50 mg BID.   I spoke with the patient regarding Dr. Olin Pia recommendations. She voices understanding and is agreeable.

## 2020-11-08 ENCOUNTER — Telehealth: Payer: Self-pay | Admitting: Internal Medicine

## 2020-11-08 NOTE — Telephone Encounter (Signed)
I had seen the patient outside of clinic and she had inquired if Eliquis samples were available.  Samples did become available today. I have notified the patient of this.  She is still in need of samples due to the donut hole.  I advised I pull these for her. They are at the front desk for pick up.  Samples Given:  Eliquis 5 mg Lot: [id number] Exp: 01/2023 # 2 boxes

## 2020-12-13 MED ORDER — FLECAINIDE ACETATE 50 MG PO TABS
50.0000 mg | ORAL_TABLET | Freq: Two times a day (BID) | ORAL | 2 refills | Status: AC
Start: 2020-12-13 — End: ?

## 2020-12-30 DIAGNOSIS — D1801 Hemangioma of skin and subcutaneous tissue: Secondary | ICD-10-CM | POA: Diagnosis not present

## 2020-12-30 DIAGNOSIS — L309 Dermatitis, unspecified: Secondary | ICD-10-CM | POA: Diagnosis not present

## 2020-12-30 DIAGNOSIS — Z08 Encounter for follow-up examination after completed treatment for malignant neoplasm: Secondary | ICD-10-CM | POA: Diagnosis not present

## 2020-12-30 DIAGNOSIS — Z85828 Personal history of other malignant neoplasm of skin: Secondary | ICD-10-CM | POA: Diagnosis not present

## 2021-01-12 ENCOUNTER — Other Ambulatory Visit: Payer: Self-pay | Admitting: Internal Medicine

## 2021-01-12 DIAGNOSIS — Z1231 Encounter for screening mammogram for malignant neoplasm of breast: Secondary | ICD-10-CM

## 2021-01-15 DIAGNOSIS — Z20822 Contact with and (suspected) exposure to covid-19: Secondary | ICD-10-CM | POA: Diagnosis not present

## 2021-01-15 DIAGNOSIS — U071 COVID-19: Secondary | ICD-10-CM | POA: Diagnosis not present

## 2021-01-17 DIAGNOSIS — J208 Acute bronchitis due to other specified organisms: Secondary | ICD-10-CM | POA: Diagnosis not present

## 2021-01-17 DIAGNOSIS — U071 COVID-19: Secondary | ICD-10-CM | POA: Diagnosis not present

## 2021-01-18 ENCOUNTER — Telehealth: Payer: Self-pay

## 2021-01-18 ENCOUNTER — Ambulatory Visit (INDEPENDENT_AMBULATORY_CARE_PROVIDER_SITE_OTHER): Payer: PPO

## 2021-01-18 DIAGNOSIS — I495 Sick sinus syndrome: Secondary | ICD-10-CM | POA: Diagnosis not present

## 2021-01-18 LAB — CUP PACEART REMOTE DEVICE CHECK
Battery Remaining Longevity: 62 mo
Battery Voltage: 3 V
Brady Statistic AP VP Percent: 0.14 %
Brady Statistic AP VS Percent: 83.58 %
Brady Statistic AS VP Percent: 1.79 %
Brady Statistic AS VS Percent: 14.49 %
Brady Statistic RA Percent Paced: 82.95 %
Brady Statistic RV Percent Paced: 1.91 %
Date Time Interrogation Session: 20220215212115
Implantable Lead Implant Date: 20160720
Implantable Lead Implant Date: 20160720
Implantable Lead Location: 753859
Implantable Lead Location: 753860
Implantable Lead Model: 5076
Implantable Lead Model: 5076
Implantable Pulse Generator Implant Date: 20160720
Lead Channel Impedance Value: 361 Ohm
Lead Channel Impedance Value: 475 Ohm
Lead Channel Impedance Value: 532 Ohm
Lead Channel Impedance Value: 551 Ohm
Lead Channel Pacing Threshold Amplitude: 0.75 V
Lead Channel Pacing Threshold Amplitude: 0.875 V
Lead Channel Pacing Threshold Pulse Width: 0.4 ms
Lead Channel Pacing Threshold Pulse Width: 0.4 ms
Lead Channel Sensing Intrinsic Amplitude: 1.25 mV
Lead Channel Sensing Intrinsic Amplitude: 1.25 mV
Lead Channel Sensing Intrinsic Amplitude: 9.75 mV
Lead Channel Sensing Intrinsic Amplitude: 9.75 mV
Lead Channel Setting Pacing Amplitude: 1.75 V
Lead Channel Setting Pacing Amplitude: 2.5 V
Lead Channel Setting Pacing Pulse Width: 0.4 ms
Lead Channel Setting Sensing Sensitivity: 1.2 mV

## 2021-01-18 NOTE — Telephone Encounter (Signed)
Follow-up transmission received.  Pt no longer in AF.  Spoke with pt, advised of status.  Pt reports that she is currently COVID +, managing ok, denies any cardiac symptoms.  Will continue monitoring.

## 2021-01-18 NOTE — Telephone Encounter (Signed)
Scheduled remote PPM transmission received- Pt is in ongoing AF, V rates controlled.  Pt has known history of PAF, current meds include Eliquis for [place], Flecainide 50mg  BID, Diltiazem 240mg  QHS and Metoprolol Succinate 25mg  Daily.    Spoke with pt, she reports feeling a little tired lately denies any other cardiac symptoms.  She will send a follow-up transmission to determine if still in AF.

## 2021-01-21 ENCOUNTER — Other Ambulatory Visit: Payer: Self-pay | Admitting: Cardiovascular Disease

## 2021-01-25 NOTE — Progress Notes (Signed)
Remote pacemaker transmission.   

## 2021-01-31 DIAGNOSIS — I480 Paroxysmal atrial fibrillation: Secondary | ICD-10-CM | POA: Diagnosis not present

## 2021-01-31 DIAGNOSIS — E782 Mixed hyperlipidemia: Secondary | ICD-10-CM | POA: Diagnosis not present

## 2021-01-31 DIAGNOSIS — Z0000 Encounter for general adult medical examination without abnormal findings: Secondary | ICD-10-CM | POA: Diagnosis not present

## 2021-02-06 ENCOUNTER — Telehealth: Payer: Self-pay | Admitting: Internal Medicine

## 2021-02-06 NOTE — Telephone Encounter (Signed)
Attempted to call the patient. Per her husband, Loneta Tamplin, she was not in at the moment.  I have left a detailed message with Mr. Leckey, (ok per DPR) of the findings on the patient's most recent transmission and asked that he please have her transmit again when she gets home to confirm her rhythm.   Reviewed the patient's chart and she was diagnosed with COVID around this time.  She did have an office visit with Dr. Sabra Heck (PCP) on 01/17/21.  - COVID symptoms started Saturday 2/12 - + COVID test on Sunday 2/13 - seen by PCP Dr. Sabra Heck on 2/15>> acute COVID bronchitis  - she was started on Cefuroxime 250 mg BID x 7 days & prednisone 20 mg QD x 10 days at that visit.   Per Mr. Saggese, the patient is currently stable at this time.   To Device Clinic as an FYI and to await results of her transmission.

## 2021-02-06 NOTE — Telephone Encounter (Signed)
Hailey Sprang, MD  02/04/2021 10:34 AM EST      Remote reviewed. This remote is abnormal for persistent aflutter on day of remote 2/15  Kem, can we we have her retransmit plz Thanks DK

## 2021-02-07 DIAGNOSIS — L578 Other skin changes due to chronic exposure to nonionizing radiation: Secondary | ICD-10-CM | POA: Diagnosis not present

## 2021-02-07 NOTE — Telephone Encounter (Signed)
LMOVM for patient to send a manual transmission with home monitor. I left my direct office number for the patient to call back for help.

## 2021-02-07 NOTE — Telephone Encounter (Signed)
Reviewed the transmission below with Dr. Caryl Comes.  Per Dr. Caryl Comes, everything looks good- the patient is back in rhythm.  Attempted to call the patient. She was unavailable at this time.  I left a message with Mr. Merlino (ok per DPR), to please advise her that the transmission was received and that she is back in rhythm. No further recommendations at this time.   Mr. Crossley, voices understanding and is agreeable.

## 2021-02-07 NOTE — Telephone Encounter (Signed)
GM2U  can u arrange a transmission and tell me what her present rhythm is Thanks SK

## 2021-02-07 NOTE — Telephone Encounter (Signed)
The patient called back today. She states she did try to send a transmission yesterday.  I advised I will forward to the device clinic to let them know and ask that they reach back out to her if this wasn't received.

## 2021-02-07 NOTE — Telephone Encounter (Signed)
Presenting : AP/VS 68 bpm.

## 2021-02-09 ENCOUNTER — Other Ambulatory Visit: Payer: Self-pay

## 2021-02-09 ENCOUNTER — Ambulatory Visit
Admission: RE | Admit: 2021-02-09 | Discharge: 2021-02-09 | Disposition: A | Discharge status: Home / Self Care | Payer: PPO | Source: Ambulatory Visit | Attending: Internal Medicine | Admitting: Internal Medicine

## 2021-02-09 DIAGNOSIS — Z1231 Encounter for screening mammogram for malignant neoplasm of breast: Secondary | ICD-10-CM | POA: Diagnosis not present

## 2021-02-21 ENCOUNTER — Encounter: Payer: Self-pay | Admitting: Internal Medicine

## 2021-02-21 ENCOUNTER — Ambulatory Visit (INDEPENDENT_AMBULATORY_CARE_PROVIDER_SITE_OTHER): Payer: PPO | Admitting: Internal Medicine

## 2021-02-21 ENCOUNTER — Other Ambulatory Visit: Payer: Self-pay

## 2021-02-21 VITALS — BP 140/58 | HR 79 | Ht 61.0 in | Wt 164.2 lb

## 2021-02-21 DIAGNOSIS — Z950 Presence of cardiac pacemaker: Secondary | ICD-10-CM | POA: Diagnosis not present

## 2021-02-21 DIAGNOSIS — I10 Essential (primary) hypertension: Secondary | ICD-10-CM

## 2021-02-21 DIAGNOSIS — I4510 Unspecified right bundle-branch block: Secondary | ICD-10-CM

## 2021-02-21 DIAGNOSIS — I480 Paroxysmal atrial fibrillation: Secondary | ICD-10-CM | POA: Diagnosis not present

## 2021-02-21 DIAGNOSIS — I495 Sick sinus syndrome: Secondary | ICD-10-CM | POA: Diagnosis not present

## 2021-02-21 LAB — PACEMAKER DEVICE OBSERVATION

## 2021-02-21 NOTE — Patient Instructions (Addendum)
Medication Instructions:  - Your physician recommends that you continue on your current medications as directed. Please refer to the Current Medication list given to you today.   Samples Given: Eliquis 5 mg Lot: [id number] Exp: 10/2022 # 3 boxes  *If you need a refill on your cardiac medications before your next appointment, please call your pharmacy*   Lab Work: - none ordered  If you have labs (blood work) drawn today and your tests are completely normal, you will receive your results only by: Marland Kitchen MyChart Message (if you have MyChart) OR . A paper copy in the mail If you have any lab test that is abnormal or we need to change your treatment, we will call you to review the results.   Testing/Procedures: - none ordered   Follow-Up: At [facility], you and your health needs are our priority.  As part of our continuing mission to provide you with exceptional heart care, we have created designated Provider Care Teams.  These Care Teams include your primary Cardiologist (physician) and Advanced Practice Providers (APPs -  Physician Assistants and Nurse Practitioners) who all work together to provide you with the care you need, when you need it.  We recommend signing up for the patient portal called "MyChart".  Sign up information is provided on this After Visit Summary.  MyChart is used to connect with patients for Virtual Visits (Telemedicine).  Patients are able to view lab/test results, encounter notes, upcoming appointments, etc.  Non-urgent messages can be sent to your provider as well.   To learn more about what you can do with MyChart, go to [email or web address].    Your next appointment:   6 month(s)  The format for your next appointment:   In Person  Provider:   Virl Axe, MD   Other Instructions n/a

## 2021-02-21 NOTE — Progress Notes (Signed)
Patient Care Team: Rusty Aus, MD as PCP - General (Unknown Physician Specialty) [name], MD as PCP - Cardiology (Cardiology) [name], MD as Consulting Physician (Cardiology)   HPI  Hailey Simmons is a 85 y.o. female Seen in followup for  Medtronic pacer implanted (2016)  for sinus node dysfunction and PAF Rx with flecainide and anticoagulation with apixaban 9-lead  Hospitalized 7/20 with severe hyponatremia (118) diuretics were discontinued.  She has been followed by renal.      Developed Covid 2/22 treated with prednisone and associated atrial fibrillation  Flecainide associated with QRS prolongation as noted below on dose reduced 10/21  The patient denies chest pain, shortness of breath, nocturnal dyspnea, orthopnea or peripheral edema.  There have been no, lightheadedness or syncope.        DATE TEST EF   2/13    Echo  60 %   8/17    Myoview     55-60 % No Ischemia        Date Cr NA K TSH Hgb  2/18 0.9    12.6  8/18  0.75    12.5  2/19 0.8    12.8  7/20 .57 118   11.2>>11.9  10/20  137     2/21 0.7 137  1.05 13.5  8/21 0.8 136 4.1 6.29 12.4  3/22 0.8 138 4.7 0.9 12.9     DATE PR duration QRSduration Dose-flecianide  3/13 160 134 0  7/15  158 50  10/16 180 154 100  6/17`  146 100  7/18 (A paced) 280 140 100  8/19 Apaced 240 142 100   9/20 Apaced 236 154 100  4/21 Apaced 260 150 100  10/21 Apace 280 170 100>>75   11/21 Apace 304 170 75>>50  3/22 Apace 275 144 50   Thromboembolic risk factors ( age -47, HTN-1, Gender-1) for a CHADSVASc Score of 4   Past Medical History:  Diagnosis Date  . Asthma 1970's - 1990's   a. ? 2/2 wood stove usage.  . Basal cell carcinoma of skin of nose   . Diverticulosis   . Dysrhythmia    A-FIB  . Goiter   . History of chicken pox   . Hx of colonic polyps   . Hypertension   . Hypothyroidism   . Menopause   . PAF (paroxysmal atrial fibrillation) ([place])    on apixoban  . Sinus node  dysfunction (HCC)    a. s/p MDT dual chamber pacemaker  . Small bowel obstruction ([place]) 08/2009    Past Surgical History:  Procedure Laterality Date  . CATARACT EXTRACTION  02/2019  . CATARACT EXTRACTION  04/2019  . COLONOSCOPY    . COLONOSCOPY WITH PROPOFOL N/A 12/02/2018   Procedure: COLONOSCOPY WITH PROPOFOL;  Surgeon: Lollie Sails, MD;  Location: [facility] ENDOSCOPY;  Service: Endoscopy;  Laterality: N/A;  . EP IMPLANTABLE DEVICE N/A 06/22/2015   MDT MRI compatible dual chamber PPM implanted by Dr Caryl Comes for symptomatic bradycardia  . INSERT / REPLACE / REMOVE PACEMAKER      Current Outpatient Medications  Medication Sig Dispense Refill  . atorvastatin (LIPITOR) 10 MG tablet TAKE ONE (1) TABLET EACH DAY 90 tablet 3  . calcium carbonate (OS-CAL) 600 MG TABS Take 600 mg by mouth 2 (two) times daily with a meal.    . Cholecalciferol (VITAMIN D3) 2000 UNITS TABS Take 1 tablet by mouth daily.    . clobetasol ointment (TEMOVATE)  0.05 % Apply topically as needed.    . Cranberry 1000 MG CAPS Take 1 capsule by mouth daily.     Marland Kitchen diltiazem (CARDIZEM CD) 240 MG 24 hr capsule Take 240 mg by mouth at bedtime.    Marland Kitchen ELIQUIS 5 MG TABS tablet TAKE ONE TABLET BY MOUTH TWICE DAILY 180 tablet 1  . ESTRACE VAGINAL 0.1 MG/GM vaginal cream Place 1 Applicatorful vaginally as needed.     . flecainide (TAMBOCOR) 50 MG tablet Take 1 tablet (50 mg total) by mouth 2 (two) times daily. 180 tablet 2  . furosemide (LASIX) 20 MG tablet Take 1 tablet (20 mg total) by mouth daily as needed. 30 tablet 2  . hydrALAZINE (APRESOLINE) 25 MG tablet Take 25 mg by mouth 2 (two) times daily.    Marland Kitchen levothyroxine (SYNTHROID, LEVOTHROID) 100 MCG tablet Take 100 mcg by mouth daily.    . metoprolol succinate (TOPROL-XL) 25 MG 24 hr tablet TAKE ONE TABLET EVERY DAY 90 tablet 2  . olmesartan (BENICAR) 40 MG tablet Take 40 mg by mouth daily as needed.    Marland Kitchen omeprazole (PRILOSEC) 40 MG capsule Take 40 mg by mouth daily.    .  polyethylene glycol (MIRALAX / GLYCOLAX) 17 g packet Take 17 g by mouth daily.    . sodium chloride 1 g tablet Take by mouth. Rarely taken per pt     No current facility-administered medications for this visit.    Allergies  Allergen Reactions  . Prednisone       Review of Systems negative except from HPI and PMH  Physical Exam  BP (!) 140/58 (BP Location: Left Arm, Patient Position: Sitting, Cuff Size: Normal)   Pulse 79   Ht 5\' 1"  (1.549 m)   Wt 164 lb 4 oz (74.5 kg)   SpO2 98%   BMI 31.03 kg/m  Well developed and well nourished in no acute distress HENT normal Neck supple with JVP-flat Clear Device pocket well healed; without hematoma or erythema.  There is no tethering  Regular rate and rhythm, no  gallop No murmur Abd-soft with active BS No Clubbing cyanosis   edema Skin-warm and dry A & Oriented  Grossly normal sensory and motor function  ECG atrial pacing at 79 Interval 28/14/42 Assessment and  Plan  Atrial fibrillation-paroxysmal  Sinus node dysfunction  Pacemaker-Medtronic       Hyponatremia  Right bundle branch block/first-degree AV block  Hypertension   Interval atrial fibrillation with increased burden since down titration of the flecainide 100--75--50 twice daily prompted by QRS/first-degree AV block widening.  She is largely asymptomatic however this time apart from the episode associated with the steroids.  Hence, we will continue her on her current dose not withstanding the increased burden.  Alternative drug therapy would be next option  Heart rate excursion is adequate.  Blood pressure well controlled  On Anticoagulation;  No bleeding issues

## 2021-04-19 ENCOUNTER — Encounter: Payer: Self-pay | Admitting: Cardiovascular Disease

## 2021-04-19 ENCOUNTER — Ambulatory Visit: Payer: PPO | Admitting: Cardiovascular Disease

## 2021-04-19 ENCOUNTER — Ambulatory Visit (INDEPENDENT_AMBULATORY_CARE_PROVIDER_SITE_OTHER): Payer: PPO

## 2021-04-19 ENCOUNTER — Other Ambulatory Visit: Payer: Self-pay

## 2021-04-19 VITALS — BP 130/60 | HR 72 | Ht 61.0 in | Wt 164.5 lb

## 2021-04-19 DIAGNOSIS — Z950 Presence of cardiac pacemaker: Secondary | ICD-10-CM | POA: Diagnosis not present

## 2021-04-19 DIAGNOSIS — I495 Sick sinus syndrome: Secondary | ICD-10-CM

## 2021-04-19 DIAGNOSIS — E782 Mixed hyperlipidemia: Secondary | ICD-10-CM | POA: Diagnosis not present

## 2021-04-19 DIAGNOSIS — I480 Paroxysmal atrial fibrillation: Secondary | ICD-10-CM

## 2021-04-19 DIAGNOSIS — I10 Essential (primary) hypertension: Secondary | ICD-10-CM

## 2021-04-19 NOTE — Progress Notes (Signed)
InPatient ID: Hailey Simmons, female   DOB: 18-Apr-1936, 85 y.o.   MRN: [id number] Cardiology Office Note  Date:  04/19/2021   ID:  Hailey Simmons, DOB March 11, 1936, MRN [id number]  PCP:  Rusty Aus, MD   Chief Complaint  Patient presents with  . 12 month follow up     "doing well." Medications reviewed by the patient verbally.     HPI:  Hailey Simmons is a very pleasant 85 year old woman with history of hypothyroidism,  hyperlipidemia ,  paroxysmal atrial fibrillation January 2013 Pacer for sinus node dysfunction Echocardiogram showed normal ejection fraction flecainide  CHA2DS2-VASc Score 4 She presents today for follow-up of her atrial fibrillation  In follow-up today reports that she is doing well Ecchymotic bruising arms and legs  End of 2021 flecainide dose decreased secondary to QRS prolongation, initially 75 down to 50 mg twice daily  Difficult February 2022, had COVID, treated with prednisone During this time she reported increased atrial fibrillation burden This was confirmed on pacer download by EP  Reports that she does occasionally feel her atrial fibrillation episodes, can document elevated heart rate, does not seem to happen frequently  No significant leg swelling Take lasix 20 mg once a week as needed, ankle swelling Eats out 2x a week  Prior issues with hyponatremia, numbers have been stable recently  Sodium level Feb 2022: 137 Total chol 152, LDL 70  Sedentary lifestyle, limited by motivation and chronic back issues  EKG personally reviewed by myself on todays visit Shows normal sinus rhythm/paced rhythm rate 72 bpm  Other past medical history reviewed in hospital 06/2019 Low sodium, 118 Since then has held her diuretics Primary care has kept her on her salt supplement to maintain sodium level  periodic urinary tract infections  She is typically very active, takes care of 3 grandchildren several days per week.   PMH:   has a past medical history  of Asthma (1970's - 1990's), Basal cell carcinoma of skin of nose, Diverticulosis, Dysrhythmia, Goiter, History of chicken pox, colonic polyps, Hypertension, Hypothyroidism, Menopause, PAF (paroxysmal atrial fibrillation) ([place]), Sinus node dysfunction ([place]), and Small bowel obstruction ([place]) (83/2010).  PSH:    Past Surgical History:  Procedure Laterality Date  . CATARACT EXTRACTION  02/2019  . CATARACT EXTRACTION  04/2019  . COLONOSCOPY    . COLONOSCOPY WITH PROPOFOL N/A 12/02/2018   Procedure: COLONOSCOPY WITH PROPOFOL;  Surgeon: Lollie Sails, MD;  Location: [facility] ENDOSCOPY;  Service: Endoscopy;  Laterality: N/A;  . EP IMPLANTABLE DEVICE N/A 06/22/2015   MDT MRI compatible dual chamber PPM implanted by Dr Caryl Comes for symptomatic bradycardia  . INSERT / REPLACE / REMOVE PACEMAKER      Current Outpatient Medications  Medication Sig Dispense Refill  . atorvastatin (LIPITOR) 10 MG tablet TAKE ONE (1) TABLET EACH DAY 90 tablet 3  . calcium carbonate (OS-CAL) 600 MG TABS Take 600 mg by mouth 2 (two) times daily with a meal.    . Cholecalciferol (VITAMIN D3) 2000 UNITS TABS Take 1 tablet by mouth daily.    . clobetasol ointment (TEMOVATE) 0.05 % Apply topically as needed.    . Cranberry 1000 MG CAPS Take 1 capsule by mouth daily.     Marland Kitchen diltiazem (CARDIZEM CD) 240 MG 24 hr capsule Take 240 mg by mouth at bedtime.    Marland Kitchen ELIQUIS 5 MG TABS tablet TAKE ONE TABLET BY MOUTH TWICE DAILY 180 tablet 1  . ESTRACE VAGINAL 0.1 MG/GM vaginal cream Place 1 Applicatorful  vaginally as needed.     . flecainide (TAMBOCOR) 50 MG tablet Take 1 tablet (50 mg total) by mouth 2 (two) times daily. 180 tablet 2  . furosemide (LASIX) 20 MG tablet Take 1 tablet (20 mg total) by mouth daily as needed. 30 tablet 2  . hydrALAZINE (APRESOLINE) 25 MG tablet Take 25 mg by mouth 2 (two) times daily.    Marland Kitchen levothyroxine (SYNTHROID, LEVOTHROID) 100 MCG tablet Take 100 mcg by mouth daily.    . metoprolol succinate (TOPROL-XL) 25  MG 24 hr tablet TAKE ONE TABLET EVERY DAY 90 tablet 2  . nitrofurantoin, macrocrystal-monohydrate, (MACROBID) 100 MG capsule Take 100 mg by mouth daily as needed.    Marland Kitchen olmesartan (BENICAR) 40 MG tablet Take 40 mg by mouth daily as needed.    Marland Kitchen omeprazole (PRILOSEC) 40 MG capsule Take 40 mg by mouth daily.    . polyethylene glycol (MIRALAX / GLYCOLAX) 17 g packet Take 17 g by mouth daily.    . sodium chloride 1 g tablet Take by mouth. Rarely taken per pt     No current facility-administered medications for this visit.     Allergies:   Prednisone   Social History:  The patient  reports that she has never smoked. She has never used smokeless tobacco. She reports that she does not drink alcohol and does not use drugs.   Family History:   family history includes Breast cancer in her maternal aunt; Heart attack in her father; Heart failure in her mother.    Review of Systems: Review of Systems  Constitutional: Negative.   HENT: Negative.   Respiratory: Negative.   Cardiovascular: Negative.   Gastrointestinal: Negative.   Musculoskeletal: Negative.   Neurological: Negative.   Psychiatric/Behavioral: Negative.   All other systems reviewed and are negative.   PHYSICAL EXAM: VS:  BP 130/60 (BP Location: Left Arm, Patient Position: Sitting, Cuff Size: Normal)   Pulse 72   Ht 5\' 1"  (1.549 m)   Wt 164 lb 8 oz (74.6 kg)   SpO2 98%   BMI 31.08 kg/m  , BMI Body mass index is 31.08 kg/m. Constitutional:  oriented to person, place, and time. No distress.  HENT:  Head: Grossly normal Eyes:  no discharge. No scleral icterus.  Neck: No JVD, no carotid bruits  Cardiovascular: Regular rate and rhythm, no murmurs appreciated Pulmonary/Chest: Clear to auscultation bilaterally, no wheezes or rails Abdominal: Soft.  no distension.  no tenderness.  Musculoskeletal: Normal range of motion Neurological:  normal muscle tone. Coordination normal. No atrophy Skin: Skin warm and dry Psychiatric:  normal affect, pleasant  Recent Labs: 09/06/2020: TSH 2.390    Lipid Panel No results found for: CHOL, HDL, LDLCALC, TRIG    Wt Readings from Last 3 Encounters:  04/19/21 164 lb 8 oz (74.6 kg)  02/21/21 164 lb 4 oz (74.5 kg)  09/06/20 164 lb 12.8 oz (74.8 kg)      ASSESSMENT AND PLAN:  PAF (paroxysmal atrial fibrillation) (HCC) - On diltiazem, metoprolol, flecainide eliquis Maintain normal sinus rhythm, She is relatively asymptomatic For paroxysmal  episodes that do not resolve in a timely manner, potentially could take additional dose of metoprolol (tartrate could be provided if needed)  Sinus node dysfunction ([place]) pacemaker, followed by Dr. Caryl Comes Paced rhythm today  Hyperlipidemia Cholesterol is at goal on the current lipid regimen. No changes to the medications were made.  Essential hypertension Blood pressure is well controlled on today's visit. No changes made to the medications.  Leg edema Minimal even on diltiazem, Stable symptoms, Lasix as needed  Hyponatremia: Lasix once a week on average Eats out twice a week Number stable   Total encounter time more than 25 minutes  Greater than 50% was spent in counseling and coordination of care with the patient    No orders of the defined types were placed in this encounter.   Signed, Esmond Plants, M.D., Ph.D. 04/19/2021  [place], [place]

## 2021-04-19 NOTE — Patient Instructions (Addendum)
Medication Instructions:  No changes  Eliquis samples given  3 boxes  Lot : [id number]  Exp: 5/24  If you need a refill on your cardiac medications before your next appointment, please call your pharmacy.    Lab work: No new labs needed   If you have labs (blood work) drawn today and your tests are completely normal, you will receive your results only by: Marland Kitchen MyChart Message (if you have MyChart) OR . A paper copy in the mail If you have any lab test that is abnormal or we need to change your treatment, we will call you to review the results.   Testing/Procedures: No new testing needed   Follow-Up: At [facility], you and your health needs are our priority.  As part of our continuing mission to provide you with exceptional heart care, we have created designated Provider Care Teams.  These Care Teams include your primary Cardiologist (physician) and Advanced Practice Providers (APPs -  Physician Assistants and Nurse Practitioners) who all work together to provide you with the care you need, when you need it.  . You will need a follow up appointment in 9 months  . Providers on your designated Care Team:   . Murray Hodgkins, NP . Christell Faith, PA-C . Marrianne Mood, PA-C  Any Other Special Instructions Will Be Listed Below (If Applicable).  COVID-19 Vaccine Information can be found at: ShippingScam.co.uk For questions related to vaccine distribution or appointments, please email vaccine@[email or web address] .[email or web address] or call [phone].

## 2021-04-20 LAB — CUP PACEART REMOTE DEVICE CHECK
Battery Remaining Longevity: 54 mo
Battery Voltage: 2.99 V
Brady Statistic AP VP Percent: 0.05 %
Brady Statistic AP VS Percent: 81.02 %
Brady Statistic AS VP Percent: 0.28 %
Brady Statistic AS VS Percent: 18.66 %
Brady Statistic RA Percent Paced: 80.96 %
Brady Statistic RV Percent Paced: 0.32 %
Date Time Interrogation Session: 20220518114619
Implantable Lead Implant Date: 20160720
Implantable Lead Implant Date: 20160720
Implantable Lead Location: 753859
Implantable Lead Location: 753860
Implantable Lead Model: 5076
Implantable Lead Model: 5076
Implantable Pulse Generator Implant Date: 20160720
Lead Channel Impedance Value: 342 Ohm
Lead Channel Impedance Value: 456 Ohm
Lead Channel Impedance Value: 513 Ohm
Lead Channel Impedance Value: 513 Ohm
Lead Channel Pacing Threshold Amplitude: 0.75 V
Lead Channel Pacing Threshold Amplitude: 0.875 V
Lead Channel Pacing Threshold Pulse Width: 0.4 ms
Lead Channel Pacing Threshold Pulse Width: 0.4 ms
Lead Channel Sensing Intrinsic Amplitude: 1.375 mV
Lead Channel Sensing Intrinsic Amplitude: 1.375 mV
Lead Channel Sensing Intrinsic Amplitude: 7.125 mV
Lead Channel Sensing Intrinsic Amplitude: 7.125 mV
Lead Channel Setting Pacing Amplitude: 1.75 V
Lead Channel Setting Pacing Amplitude: 2.5 V
Lead Channel Setting Pacing Pulse Width: 0.4 ms
Lead Channel Setting Sensing Sensitivity: 1.2 mV

## 2021-05-11 NOTE — Progress Notes (Signed)
Remote pacemaker transmission.   

## 2021-07-19 ENCOUNTER — Ambulatory Visit (INDEPENDENT_AMBULATORY_CARE_PROVIDER_SITE_OTHER): Payer: PPO

## 2021-07-19 DIAGNOSIS — I495 Sick sinus syndrome: Secondary | ICD-10-CM | POA: Diagnosis not present

## 2021-07-21 ENCOUNTER — Telehealth: Payer: Self-pay

## 2021-07-21 NOTE — Telephone Encounter (Signed)
Advised patient that transmission has not been received as of yet.  Provided her with Tech support number since she is getting an error message of [id number].  Pt reports she is feeling ok, she thinks she may have had some AF, but feels ok now.  She will call tech support and report back to [place] if necessary.

## 2021-07-21 NOTE — Telephone Encounter (Signed)
-----   Message from Emily Filbert, RN sent at 07/21/2021 11:08 AM EDT ----- Marykay Lex ladies,   This patient called me and wanted to make sure her transmission when through yesterday. She said it took a while to transmit, but she eventually got a "check" mark, but after that there was a number code that popped up on her transmitter.  I told her I would check with you all to verify.  Do you mind just touching base with her to let her know either way.  Thank you! Nira Conn

## 2021-07-24 NOTE — Telephone Encounter (Signed)
LMOVM for patient to return my call.

## 2021-07-24 NOTE — Telephone Encounter (Signed)
Patient returned call and has sent that transmission successfully

## 2021-07-25 LAB — CUP PACEART REMOTE DEVICE CHECK
Battery Remaining Longevity: 56 mo
Battery Voltage: 2.98 V
Brady Statistic AP VP Percent: 0.11 %
Brady Statistic AP VS Percent: 92.24 %
Brady Statistic AS VP Percent: 0.94 %
Brady Statistic AS VS Percent: 6.71 %
Brady Statistic RA Percent Paced: 91.95 %
Brady Statistic RV Percent Paced: 1.04 %
Date Time Interrogation Session: 20220822104736
Implantable Lead Implant Date: 20160720
Implantable Lead Implant Date: 20160720
Implantable Lead Location: 753859
Implantable Lead Location: 753860
Implantable Lead Model: 5076
Implantable Lead Model: 5076
Implantable Pulse Generator Implant Date: 20160720
Lead Channel Impedance Value: 342 Ohm
Lead Channel Impedance Value: 437 Ohm
Lead Channel Impedance Value: 532 Ohm
Lead Channel Impedance Value: 532 Ohm
Lead Channel Pacing Threshold Amplitude: 0.75 V
Lead Channel Pacing Threshold Amplitude: 0.875 V
Lead Channel Pacing Threshold Pulse Width: 0.4 ms
Lead Channel Pacing Threshold Pulse Width: 0.4 ms
Lead Channel Sensing Intrinsic Amplitude: 2.375 mV
Lead Channel Sensing Intrinsic Amplitude: 2.375 mV
Lead Channel Sensing Intrinsic Amplitude: 7.75 mV
Lead Channel Sensing Intrinsic Amplitude: 7.75 mV
Lead Channel Setting Pacing Amplitude: 1.75 V
Lead Channel Setting Pacing Amplitude: 2.5 V
Lead Channel Setting Pacing Pulse Width: 0.4 ms
Lead Channel Setting Sensing Sensitivity: 1.2 mV

## 2021-07-27 DIAGNOSIS — E782 Mixed hyperlipidemia: Secondary | ICD-10-CM | POA: Diagnosis not present

## 2021-08-03 DIAGNOSIS — E782 Mixed hyperlipidemia: Secondary | ICD-10-CM | POA: Diagnosis not present

## 2021-08-03 DIAGNOSIS — I480 Paroxysmal atrial fibrillation: Secondary | ICD-10-CM | POA: Diagnosis not present

## 2021-08-03 DIAGNOSIS — E538 Deficiency of other specified B group vitamins: Secondary | ICD-10-CM | POA: Diagnosis not present

## 2021-08-03 DIAGNOSIS — D500 Iron deficiency anemia secondary to blood loss (chronic): Secondary | ICD-10-CM | POA: Diagnosis not present

## 2021-08-08 NOTE — Progress Notes (Signed)
Remote pacemaker transmission.   

## 2021-08-29 ENCOUNTER — Telehealth: Payer: Self-pay | Admitting: Internal Medicine

## 2021-08-29 ENCOUNTER — Ambulatory Visit (INDEPENDENT_AMBULATORY_CARE_PROVIDER_SITE_OTHER): Payer: PPO | Admitting: Internal Medicine

## 2021-08-29 ENCOUNTER — Other Ambulatory Visit: Payer: Self-pay

## 2021-08-29 ENCOUNTER — Encounter: Payer: Self-pay | Admitting: Internal Medicine

## 2021-08-29 VITALS — BP 138/62 | HR 89 | Ht 61.0 in | Wt 167.0 lb

## 2021-08-29 DIAGNOSIS — Z950 Presence of cardiac pacemaker: Secondary | ICD-10-CM | POA: Diagnosis not present

## 2021-08-29 DIAGNOSIS — D649 Anemia, unspecified: Secondary | ICD-10-CM

## 2021-08-29 DIAGNOSIS — I10 Essential (primary) hypertension: Secondary | ICD-10-CM | POA: Diagnosis not present

## 2021-08-29 DIAGNOSIS — I495 Sick sinus syndrome: Secondary | ICD-10-CM | POA: Diagnosis not present

## 2021-08-29 DIAGNOSIS — I480 Paroxysmal atrial fibrillation: Secondary | ICD-10-CM | POA: Diagnosis not present

## 2021-08-29 DIAGNOSIS — I4510 Unspecified right bundle-branch block: Secondary | ICD-10-CM | POA: Diagnosis not present

## 2021-08-29 LAB — PACEMAKER DEVICE OBSERVATION

## 2021-08-29 NOTE — Telephone Encounter (Signed)
  Patient Consent for Virtual Visit        Hailey Simmons has provided verbal consent on 08/29/2021 for a virtual visit (video or telephone).   CONSENT FOR VIRTUAL VISIT FOR:  Hailey Simmons  By participating in this virtual visit I agree to the following:  I hereby voluntarily request, consent and authorize [facility] and its employed or contracted physicians, physician assistants, nurse practitioners or other licensed health care professionals (the Practitioner), to provide me with telemedicine health care services (the "Services") as deemed necessary by the treating Practitioner. I acknowledge and consent to receive the Services by the Practitioner via telemedicine. I understand that the telemedicine visit will involve communicating with the Practitioner through live audiovisual communication technology and the disclosure of certain medical information by electronic transmission. I acknowledge that I have been given the opportunity to request an in-person assessment or other available alternative prior to the telemedicine visit and am voluntarily participating in the telemedicine visit.  I understand that I have the right to withhold or withdraw my consent to the use of telemedicine in the course of my care at any time, without affecting my right to future care or treatment, and that the Practitioner or I may terminate the telemedicine visit at any time. I understand that I have the right to inspect all information obtained and/or recorded in the course of the telemedicine visit and may receive copies of available information for a reasonable fee.  I understand that some of the potential risks of receiving the Services via telemedicine include:  Delay or interruption in medical evaluation due to technological equipment failure or disruption; Information transmitted may not be sufficient (e.g. poor resolution of images) to allow for appropriate medical decision making by the Practitioner; and/or   In rare instances, security protocols could fail, causing a breach of personal health information.  Furthermore, I acknowledge that it is my responsibility to provide information about my medical history, conditions and care that is complete and accurate to the best of my ability. I acknowledge that Practitioner's advice, recommendations, and/or decision may be based on factors not within their control, such as incomplete or inaccurate data provided by me or distortions of diagnostic images or specimens that may result from electronic transmissions. I understand that the practice of medicine is not an exact science and that Practitioner makes no warranties or guarantees regarding treatment outcomes. I acknowledge that a copy of this consent can be made available to me via my patient portal ([facility]), or I can request a printed copy by calling the office of [facility].    I understand that my insurance will be billed for this visit.   I have read or had this consent read to me. I understand the contents of this consent, which adequately explains the benefits and risks of the Services being provided via telemedicine.  I have been provided ample opportunity to ask questions regarding this consent and the Services and have had my questions answered to my satisfaction. I give my informed consent for the services to be provided through the use of telemedicine in my medical care

## 2021-08-29 NOTE — Patient Instructions (Addendum)
Medication Instructions:  - Your physician recommends that you continue on your current medications as directed. Please refer to the Current Medication list given to you today.  Samples Given: Eliquis 5 mg Lot: [id number] Exp: 08/2023 # 2 boxes  *If you need a refill on your cardiac medications before your next appointment, please call your pharmacy*   Lab Work: - Your physician recommends that you have lab work today: Ferritin/ Iron/ TIBC  If you have labs (blood work) drawn today and your tests are completely normal, you will receive your results only by: [place] (if you have MyChart) OR A paper copy in the mail If you have any lab test that is abnormal or we need to change your treatment, we will call you to review the results.   Testing/Procedures: -  Your physician has requested that you have an echocardiogram. Echocardiography is a painless test that uses sound waves to create images of your heart. It provides your doctor with information about the size and shape of your heart and how well your heart's chambers and valves are working. This procedure takes approximately one hour. There are no restrictions for this procedure. There is a possibility that an IV may need to be started during your test to inject an image enhancing agent. This is done to obtain more optimal pictures of your heart. Therefore we ask that you do at least drink some water prior to coming in to hydrate your veins.     Follow-Up: At [facility], you and your health needs are our priority.  As part of our continuing mission to provide you with exceptional heart care, we have created designated Provider Care Teams.  These Care Teams include your primary Cardiologist (physician) and Advanced Practice Providers (APPs -  Physician Assistants and Nurse Practitioners) who all work together to provide you with the care you need, when you need it.  We recommend signing up for the patient portal called "MyChart".   Sign up information is provided on this After Visit Summary.  MyChart is used to connect with patients for Virtual Visits (Telemedicine).  Patients are able to view lab/test results, encounter notes, upcoming appointments, etc.  Non-urgent messages can be sent to your provider as well.   To learn more about what you can do with MyChart, go to [email or web address].    Your next appointment:   Tuesday 09/05/21 @ 8:00 am   The format for your next appointment:   Virtual Visit / MyChart Video (needs consent updated)  Provider:   Virl Axe, MD   Other Instructions  Echocardiogram An echocardiogram is a test that uses sound waves (ultrasound) to produce images of the heart. Images from an echocardiogram can provide important information about: Heart size and shape. The size and thickness and movement of your heart's walls. Heart muscle function and strength. Heart valve function or if you have stenosis. Stenosis is when the heart valves are too narrow. If blood is flowing backward through the heart valves (regurgitation). A tumor or infectious growth around the heart valves. Areas of heart muscle that are not working well because of poor blood flow or injury from a heart attack. Aneurysm detection. An aneurysm is a weak or damaged part of an artery wall. The wall bulges out from the normal force of blood pumping through the body. Tell a health care provider about: Any allergies you have. All medicines you are taking, including vitamins, herbs, eye drops, creams, and over-the-counter medicines. Any blood disorders you have.  Any surgeries you have had. Any medical conditions you have. Whether you are pregnant or may be pregnant. What are the risks? Generally, this is a safe test. However, problems may occur, including an allergic reaction to dye (contrast) that may be used during the test. What happens before the test? No specific preparation is needed. You may eat and drink  normally. What happens during the test?  You will take off your clothes from the waist up and put on a hospital gown. Electrodes or electrocardiogram (ECG)patches may be placed on your chest. The electrodes or patches are then connected to a device that monitors your heart rate and rhythm. You will lie down on a table for an ultrasound exam. A gel will be applied to your chest to help sound waves pass through your skin. A handheld device, called a transducer, will be pressed against your chest and moved over your heart. The transducer produces sound waves that travel to your heart and bounce back (or "echo" back) to the transducer. These sound waves will be captured in real-time and changed into images of your heart that can be viewed on a video monitor. The images will be recorded on a computer and reviewed by your health care provider. You may be asked to change positions or hold your breath for a short time. This makes it easier to get different views or better views of your heart. In some cases, you may receive contrast through an IV in one of your veins. This can improve the quality of the pictures from your heart. The procedure may vary among health care providers and hospitals. What can I expect after the test? You may return to your normal, everyday life, including diet, activities, and medicines, unless your health care provider tells you not to do that. Follow these instructions at home: It is up to you to get the results of your test. Ask your health care provider, or the department that is doing the test, when your results will be ready. Keep all follow-up visits. This is important. Summary An echocardiogram is a test that uses sound waves (ultrasound) to produce images of the heart. Images from an echocardiogram can provide important information about the size and shape of your heart, heart muscle function, heart valve function, and other possible heart problems. You do not need to do  anything to prepare before this test. You may eat and drink normally. After the echocardiogram is completed, you may return to your normal, everyday life, unless your health care provider tells you not to do that. This information is not intended to replace advice given to you by your health care provider. Make sure you discuss any questions you have with your health care provider. Document Revised: 07/12/2020 Document Reviewed: 07/12/2020 Elsevier Patient Education  2022 [place].     Cardiac Ablation Cardiac ablation is a procedure to destroy, or ablate, a small amount of heart tissue in very specific places. The heart has many electrical connections. Sometimes these connections are abnormal and can cause the heart to beat very fast or irregularly. Ablating some of the areas that cause problems can improve the heart's rhythm or return it to normal. Ablation may be done for people who: Have Wolff-Parkinson-White syndrome. Have fast heart rhythms (tachycardia). Have taken medicines for an abnormal heart rhythm (arrhythmia) that were not effective or caused side effects. Have a high-risk heartbeat that may be life-threatening. During the procedure, a small incision is made in the neck or the groin,  and a long, thin tube (catheter) is inserted into the incision and moved to the heart. Small devices (electrodes) on the tip of the catheter will send out electrical currents. A type of X-ray (fluoroscopy) will be used to help guide the catheter and to provide images of the heart. Tell a health care provider about: Any allergies you have. All medicines you are taking, including vitamins, herbs, eye drops, creams, and over-the-counter medicines. Any problems you or family members have had with anesthetic medicines. Any blood disorders you have. Any surgeries you have had. Any medical conditions you have, such as kidney failure. Whether you are pregnant or may be pregnant. What are the  risks? Generally, this is a safe procedure. However, problems may occur, including: Infection. Bruising and bleeding at the catheter insertion site. Bleeding into the chest, especially into the sac that surrounds the heart. This is a serious complication. Stroke or blood clots. Damage to nearby structures or organs. Allergic reaction to medicines or dyes. Need for a permanent pacemaker if the normal electrical system is damaged. A pacemaker is a small computer that sends electrical signals to the heart and helps your heart beat normally. The procedure not being fully effective. This may not be recognized until months later. Repeat ablation procedures are sometimes done. What happens before the procedure? Medicines Ask your health care provider about: Changing or stopping your regular medicines. This is especially important if you are taking diabetes medicines or blood thinners. Taking medicines such as aspirin and ibuprofen. These medicines can thin your blood. Do not take these medicines unless your health care provider tells you to take them. Taking over-the-counter medicines, vitamins, herbs, and supplements. General instructions Follow instructions from your health care provider about eating or drinking restrictions. Plan to have someone take you home from the hospital or clinic. If you will be going home right after the procedure, plan to have someone with you for 24 hours. Ask your health care provider what steps will be taken to prevent infection. What happens during the procedure?  An IV will be inserted into one of your veins. You will be given a medicine to help you relax (sedative). The skin on your neck or groin will be numbed. An incision will be made in your neck or your groin. A needle will be inserted through the incision and into a large vein in your neck or groin. A catheter will be inserted into the needle and moved to your heart. Dye may be injected through the  catheter to help your surgeon see the area of the heart that needs treatment. Electrical currents will be sent from the catheter to ablate heart tissue in desired areas. There are three types of energy that may be used to do this: Heat (radiofrequency energy). Laser energy. Extreme cold (cryoablation). When the tissue has been ablated, the catheter will be removed. Pressure will be held on the insertion area to prevent a lot of bleeding. A bandage (dressing) will be placed over the insertion area. The exact procedure may vary among health care providers and hospitals. What happens after the procedure? Your blood pressure, heart rate, breathing rate, and blood oxygen level will be monitored until you leave the hospital or clinic. Your insertion area will be monitored for bleeding. You will need to lie still for a few hours to ensure that you do not bleed from the insertion area. Do not drive for 24 hours or as long as told by your health care provider. Summary Cardiac  ablation is a procedure to destroy, or ablate, a small amount of heart tissue using an electrical current. This procedure can improve the heart rhythm or return it to normal. Tell your health care provider about any medical conditions you may have and all medicines you are taking to treat them. This is a safe procedure, but problems may occur. Problems may include infection, bruising, damage to nearby organs or structures, or allergic reactions to medicines. Follow your health care provider's instructions about eating and drinking before the procedure. You may also be told to change or stop some of your medicines. After the procedure, do not drive for 24 hours or as long as told by your health care provider. This information is not intended to replace advice given to you by your health care provider. Make sure you discuss any questions you have with your health care provider. Document Revised: 09/28/2019 Document Reviewed:  09/28/2019 Elsevier Patient Education  [place].

## 2021-08-29 NOTE — Progress Notes (Signed)
Patient Care Team: Rusty Aus, MD as PCP - General (Unknown Physician Specialty) [name], MD as PCP - Cardiology (Cardiology) [name], MD as Consulting Physician (Cardiology)   HPI  Hailey Simmons is a 85 y.o. female Seen in followup for  Medtronic pacer implanted (2016)  for sinus node dysfunction and PAF Rx with flecainide and anticoagulation with apixaban   Hospitalized 7/20 with severe hyponatremia (118) diuretics were discontinued.  She has been followed by renal.      Developed Covid 2/22 treated with prednisone and associated atrial fibrillation  The patient denies chest pain, shortness of breath, nocturnal dyspnea, orthopnea or peripheral edema.  There have been no palpitations, lightheadedness or syncope.  Atrial fibrillation is associated with a nondescript malaise and some fatigue.  No clear triggers  Intercurrently has been noted to have a mild decrease in her hemoglobin.  This is been intermittent over the last few years.  She is scheduled for a GI evaluation in the middle of November.   No obvious bleeding on her apixaban    DATE TEST EF   2/13    Echo  60 %   8/17    Myoview     55-60 % No Ischemia        Date Cr NA K TSH Hgb  2/18 0.9    12.6  8/18  0.75    12.5  2/19 0.8    12.8  7/20 .57 118   11.2>>11.9  10/20  137     2/21 0.7 137  1.05 13.5  8/21 0.8 136 4.1 6.29 12.4  3/22 0.8 138 4.7 0.9 12.9  8/22 0.9 142 4.3  11.9     DATE PR duration QRSduration Dose-flecianide  3/13 160 134 0  7/15  158 50  10/16 180 154 100  6/17`  146 100  7/18 (A paced) 280 140 100  8/19 Apaced 240 142 100   9/20 Apaced 236 154 100  4/21 Apaced 260 150 100  10/21 Apace 280 170 100>>75   11/21 Apace 304 170 75>>50  3/22 Apace 275 144 50   Thromboembolic risk factors ( age -25, HTN-1, Gender-1) for a CHADSVASc Score of 4   Past Medical History:  Diagnosis Date   Asthma 1970's - 1990's   a. ? 2/2 wood stove usage.   Basal cell  carcinoma of skin of nose    Diverticulosis    Dysrhythmia    A-FIB   Goiter    History of chicken pox    Hx of colonic polyps    Hypertension    Hypothyroidism    Menopause    PAF (paroxysmal atrial fibrillation) ([place])    on apixoban   Sinus node dysfunction ([place])    a. s/p MDT dual chamber pacemaker   Small bowel obstruction ([place]) 08/2009    Past Surgical History:  Procedure Laterality Date   CATARACT EXTRACTION  02/2019   CATARACT EXTRACTION  04/2019   COLONOSCOPY     COLONOSCOPY WITH PROPOFOL N/A 12/02/2018   Procedure: COLONOSCOPY WITH PROPOFOL;  Surgeon: Lollie Sails, MD;  Location: [facility] ENDOSCOPY;  Service: Endoscopy;  Laterality: N/A;   EP IMPLANTABLE DEVICE N/A 06/22/2015   MDT MRI compatible dual chamber PPM implanted by Dr Caryl Comes for symptomatic bradycardia   INSERT / REPLACE / REMOVE PACEMAKER      Current Outpatient Medications  Medication Sig Dispense Refill   atorvastatin (LIPITOR) 10 MG tablet TAKE  ONE (1) TABLET EACH DAY 90 tablet 3   calcium carbonate (OS-CAL) 600 MG TABS Take 600 mg by mouth 2 (two) times daily with a meal.     Cholecalciferol (VITAMIN D3) 2000 UNITS TABS Take 1 tablet by mouth daily.     clobetasol ointment (TEMOVATE) 0.05 % Apply topically as needed.     Cranberry 1000 MG CAPS Take 1 capsule by mouth daily.      diltiazem (CARDIZEM CD) 240 MG 24 hr capsule Take 240 mg by mouth at bedtime.     ELIQUIS 5 MG TABS tablet TAKE ONE TABLET BY MOUTH TWICE DAILY 180 tablet 1   ESTRACE VAGINAL 0.1 MG/GM vaginal cream Place 1 Applicatorful vaginally as needed.      flecainide (TAMBOCOR) 50 MG tablet Take 1 tablet (50 mg total) by mouth 2 (two) times daily. 180 tablet 2   furosemide (LASIX) 20 MG tablet Take 1 tablet (20 mg total) by mouth daily as needed. 30 tablet 2   hydrALAZINE (APRESOLINE) 25 MG tablet Take 25 mg by mouth 2 (two) times daily.     levothyroxine (SYNTHROID, LEVOTHROID) 100 MCG tablet Take 100 mcg by mouth daily.      metoprolol succinate (TOPROL-XL) 25 MG 24 hr tablet TAKE ONE TABLET EVERY DAY 90 tablet 2   nitrofurantoin, macrocrystal-monohydrate, (MACROBID) 100 MG capsule Take 100 mg by mouth daily as needed.     olmesartan (BENICAR) 40 MG tablet Take 40 mg by mouth daily.     omeprazole (PRILOSEC) 40 MG capsule Take 40 mg by mouth daily.     polyethylene glycol (MIRALAX / GLYCOLAX) 17 g packet Take 17 g by mouth daily.     sodium chloride 1 g tablet Take by mouth. Rarely taken per pt     traZODone (DESYREL) 50 MG tablet Take 50 mg by mouth at bedtime as needed.     No current facility-administered medications for this visit.    Allergies  Allergen Reactions   Prednisone       Review of Systems negative except from HPI and PMH  Physical Exam  BP 138/62 (BP Location: Left Arm, Patient Position: Sitting, Cuff Size: Normal)   Pulse 89   Ht 5\' 1"  (1.549 m)   Wt 167 lb (75.8 kg)   SpO2 98%   BMI 31.55 kg/m  Well developed and nourished in no acute distress HENT normal Neck supple with JVP-flat Carotids brisk and full without bruits Clear Irregularly irregular rate and rhythm with controlled ventricular response, no murmurs or gallops Abd-soft with active BS without hepatomegaly No Clubbing cyanosis edema Skin-warm and dry A & Oriented  Grossly normal sensory and motor function   ECG: AF  @89             Intervals  -/14/41  Axis 25    Assessment and  Plan  Atrial fibrillation-persisting but mostly paroxysmal  Sinus node dysfunction  Pacemaker-Medtronic       Hyponatremia  Right bundle branch block/first-degree AV block  Hypertension   Interval atrial fibrillation continues to increase in its burden now on the lower dose of flecainide required by impact on conduction intervals.  Given the symptoms of her atrial fibrillation discussed alternative rhythm control either by drugs or by ablation.  Discussed Tikosyn and sotalol, requiring in-hospital initiation, amiodarone and its  potential side effect profile and catheter ablation.  Given she is a relatively young 5 woman, I would favor ablation over amiodarone and have reviewed this with her and she  will discuss it with her husband.  In anticipation of that for now, we will undertake an echocardiogram to look at left ventricular function left atrial size and continue her flecainide 50 mg twice daily.  We will also continue her apixaban at 5 mg twice daily  Blood pressure is reasonably controlled we will continue her diltiazem at 240 and hydralazine 25 twice daily and metoprolol 25 daily and Benicar 40 mg daily.  With her anemia, will check her iron labs.  The evaluation of this anemia i.e. does she need a colonoscopy will have an impact on scheduling of a catheter ablation.

## 2021-08-30 LAB — IRON,TIBC AND FERRITIN PANEL
Ferritin: 164 ng/mL — ABNORMAL HIGH (ref 15–150)
Iron Saturation: 12 % — ABNORMAL LOW (ref 15–55)
Iron: 35 ug/dL (ref 27–139)
Total Iron Binding Capacity: 287 ug/dL (ref 250–450)
UIBC: 252 ug/dL (ref 118–369)

## 2021-09-04 ENCOUNTER — Ambulatory Visit
Admission: RE | Admit: 2021-09-04 | Discharge: 2021-09-04 | Disposition: A | Discharge status: Home / Self Care | Payer: PPO | Source: Ambulatory Visit | Attending: Internal Medicine | Admitting: Internal Medicine

## 2021-09-04 ENCOUNTER — Other Ambulatory Visit: Payer: Self-pay

## 2021-09-04 DIAGNOSIS — I480 Paroxysmal atrial fibrillation: Secondary | ICD-10-CM | POA: Diagnosis not present

## 2021-09-04 DIAGNOSIS — I10 Essential (primary) hypertension: Secondary | ICD-10-CM | POA: Diagnosis not present

## 2021-09-04 LAB — ECHOCARDIOGRAM COMPLETE
AR max vel: 2.06 cm2
AV Area VTI: 2.32 cm2
AV Area mean vel: 2.26 cm2
AV Mean grad: 4.5 mmHg
AV Peak grad: 7.6 mmHg
Ao pk vel: 1.38 m/s
Area-P 1/2: 3.07 cm2
S' Lateral: 2.4 cm

## 2021-09-04 NOTE — Progress Notes (Signed)
*  PRELIMINARY RESULTS* Echocardiogram 2D Echocardiogram has been performed.  Sherrie Sport 09/04/2021, 10:32 AM

## 2021-09-05 ENCOUNTER — Ambulatory Visit (INDEPENDENT_AMBULATORY_CARE_PROVIDER_SITE_OTHER): Payer: PPO | Admitting: Internal Medicine

## 2021-09-05 ENCOUNTER — Encounter: Payer: Self-pay | Admitting: Internal Medicine

## 2021-09-05 VITALS — BP 141/91 | HR 75 | Ht 61.0 in | Wt 164.2 lb

## 2021-09-05 DIAGNOSIS — I10 Essential (primary) hypertension: Secondary | ICD-10-CM | POA: Diagnosis not present

## 2021-09-05 DIAGNOSIS — Z950 Presence of cardiac pacemaker: Secondary | ICD-10-CM

## 2021-09-05 DIAGNOSIS — I480 Paroxysmal atrial fibrillation: Secondary | ICD-10-CM

## 2021-09-05 DIAGNOSIS — I495 Sick sinus syndrome: Secondary | ICD-10-CM

## 2021-09-05 NOTE — Progress Notes (Signed)
Electrophysiology TeleHealth Note   Due to national recommendations of social distancing due to COVID 19, an audio/video telehealth visit is felt to be most appropriate for this patient at this time.  See MyChart message from today for the patient's consent to telehealth for [facility].   Date:  09/05/2021   ID:  Hailey Simmons, DOB [birth year], MRN [id number]  Location: patient's home  Provider location: [address], [place]  Evaluation Performed: Follow-up visit  PCP:  Rusty Aus, MD  Cardiologist:     Electrophysiologist:  SK   Chief Complaint:  atrial fibrillation  History of Present Illness:    Hailey Simmons is a 85 y.o. female who presents via audio/video conferencing for a telehealth visit today.  Since last being seen in our clinic for atrial conversation  the patient reports doing pretty well   We had decided to reassess regarding symptomatic atrial fibrillation   She has an ongoing eval for Anemia of presumed GI origin ( although fecal blood has not been identified )    The patient denies symptoms of fevers, chills, cough, or new SOB worrisome for COVID 19.    Past Medical History:  Diagnosis Date   Asthma 1970's - 1990's   a. ? 2/2 wood stove usage.   Basal cell carcinoma of skin of nose    Diverticulosis    Dysrhythmia    A-FIB   Goiter    History of chicken pox    Hx of colonic polyps    Hypertension    Hypothyroidism    Menopause    PAF (paroxysmal atrial fibrillation) ([place])    on apixoban   Sinus node dysfunction ([place])    a. s/p MDT dual chamber pacemaker   Small bowel obstruction ([place]) 08/2009    Past Surgical History:  Procedure Laterality Date   CATARACT EXTRACTION  02/2019   CATARACT EXTRACTION  04/2019   COLONOSCOPY     COLONOSCOPY WITH PROPOFOL N/A 12/02/2018   Procedure: COLONOSCOPY WITH PROPOFOL;  Surgeon: Lollie Sails, MD;  Location: [facility] ENDOSCOPY;  Service: Endoscopy;  Laterality: N/A;   EP  IMPLANTABLE DEVICE N/A 06/22/2015   MDT MRI compatible dual chamber PPM implanted by Dr Caryl Comes for symptomatic bradycardia   INSERT / REPLACE / REMOVE PACEMAKER      Current Outpatient Medications  Medication Sig Dispense Refill   atorvastatin (LIPITOR) 10 MG tablet TAKE ONE (1) TABLET EACH DAY 90 tablet 3   calcium carbonate (OS-CAL) 600 MG TABS Take 600 mg by mouth 2 (two) times daily with a meal.     Cholecalciferol (VITAMIN D3) 2000 UNITS TABS Take 1 tablet by mouth daily.     clobetasol ointment (TEMOVATE) 0.05 % Apply topically as needed.     Cranberry 1000 MG CAPS Take 1 capsule by mouth daily.      diltiazem (CARDIZEM CD) 240 MG 24 hr capsule Take 240 mg by mouth at bedtime.     ELIQUIS 5 MG TABS tablet TAKE ONE TABLET BY MOUTH TWICE DAILY 180 tablet 1   ESTRACE VAGINAL 0.1 MG/GM vaginal cream Place 1 Applicatorful vaginally as needed.      flecainide (TAMBOCOR) 50 MG tablet Take 1 tablet (50 mg total) by mouth 2 (two) times daily. 180 tablet 2   furosemide (LASIX) 20 MG tablet Take 1 tablet (20 mg total) by mouth daily as needed. 30 tablet 2   hydrALAZINE (APRESOLINE) 25 MG tablet Take 25 mg by mouth  2 (two) times daily.     levothyroxine (SYNTHROID, LEVOTHROID) 100 MCG tablet Take 100 mcg by mouth daily.     metoprolol succinate (TOPROL-XL) 25 MG 24 hr tablet TAKE ONE TABLET EVERY DAY 90 tablet 2   nitrofurantoin, macrocrystal-monohydrate, (MACROBID) 100 MG capsule Take 100 mg by mouth daily as needed.     olmesartan (BENICAR) 40 MG tablet Take 40 mg by mouth daily.     omeprazole (PRILOSEC) 40 MG capsule Take 40 mg by mouth daily as needed.     polyethylene glycol (MIRALAX / GLYCOLAX) 17 g packet Take 17 g by mouth daily.     sodium chloride 1 g tablet Take by mouth. Rarely taken per pt     traZODone (DESYREL) 50 MG tablet Take 50 mg by mouth at bedtime as needed.     No current facility-administered medications for this visit.    Allergies:   Prednisone   Social History:   The patient  reports that she has never smoked. She has never used smokeless tobacco. She reports that she does not drink alcohol and does not use drugs.   Family History:  The patient's   family history includes Breast cancer in her maternal aunt; Heart attack in her father; Heart failure in her mother.   ROS:  Please see the history of present illness.   All other systems are personally reviewed and negative.    Exam:    Vital Signs:  BP (!) 141/91   Pulse 75   Ht 5\' 1"  (1.549 m)   Wt 164 lb 3.2 oz (74.5 kg)   BMI 31.03 kg/m        Labs/Other Tests and Data Reviewed:    Recent Labs: 09/06/2020: TSH 2.390   Wt Readings from Last 3 Encounters:  09/05/21 164 lb 3.2 oz (74.5 kg)  08/29/21 167 lb (75.8 kg)  04/19/21 164 lb 8 oz (74.6 kg)     Other studies personally reviewed: Additional studies/ records that were reviewed today include:       ASSESSMENT & PLAN:    Atrial fibrillation-persisting but mostly paroxysmal   Sinus node dysfunction   Pacemaker-[employer]              Hyponatremia   Right bundle branch block/first-degree AV block   Hypertension   Flecainide assoc with excessive QRS widening  She has oongoing problems with symptomatic atrial fibrillation  I have reached out to [facility] regarding OV for consideration in this quite healthy 85 yo woman.  She also has FE eDef anemia for which eval with GI is scheduled but not yet until 11/22   I will reach out to see if can be adjusted if pt desires   For now will continue on the lower dose flecainide  COVID 19 screen The patient denies symptoms of COVID 19 at this time.  The importance of social distancing was discussed today.  Follow-up:       Current medicines are reviewed at length with the patient today.   The patient does not have concerns regarding her medicines.  The following changes were made today:  none  Labs/ tests ordered today include:   No orders of the defined types were placed in this  encounter.   Future tests ( post COVID )     Patient Risk:  after full review of this patients clinical status, I feel that they are at moderate  risk at this time.  Today, I have spent 13 minutes with the  patient with telehealth technology discussing the above.  Signed, Virl Axe, MD  09/05/2021 8:55 AM     [address] [place] [address] [phone] (office) [phone] (fax)

## 2021-09-07 ENCOUNTER — Telehealth: Payer: Self-pay | Admitting: Internal Medicine

## 2021-09-07 NOTE — Telephone Encounter (Signed)
Note and avs pending completion .  Please advise checkout.

## 2021-09-08 NOTE — Telephone Encounter (Signed)
Deboraha Sprang, MD  09/06/2021  7:48 PM EDT     Please Inform Patient thather heart looks good   Thanks    Also, message received from Dr. Caryl Comes stating he spoke with Dr. Rayann Heman about seeing her & doing her ablation prior to him leaving in January.  Per Dr. Caryl Comes, Dr. Rayann Heman is agreeable. He also advised that he spoke with Dr. Sabra Heck regarding timing of the colonoscopy and that he felt the ablation should be done first.   Attempted to call the patient with her echo results and Dr. Olin Pia recommendations as above.  No answer- I left a message to please call back.

## 2021-09-08 NOTE — Telephone Encounter (Signed)
I spoke with the patient. I have advised her of Dr. Olin Pia discussion with Dr. Rayann Heman & Dr. Sabra Heck.   She voices understanding and is agreeable.  She states she was actually called by Dr. Jackalyn Lombard office yesterday and they have her scheduled for an appointment with him on Monday 09/11/21. She was agreeable with the plan as advised by Dr. Caryl Comes.  I did forget to mention to her that her echo was normal.  Will send a message through [place].

## 2021-09-11 ENCOUNTER — Ambulatory Visit: Payer: PPO | Admitting: Internal Medicine

## 2021-09-11 ENCOUNTER — Other Ambulatory Visit: Payer: Self-pay

## 2021-09-11 ENCOUNTER — Encounter: Payer: Self-pay | Admitting: Internal Medicine

## 2021-09-11 ENCOUNTER — Encounter: Payer: Self-pay | Admitting: *Deleted

## 2021-09-11 VITALS — BP 142/80 | HR 82 | Ht 61.0 in | Wt 164.0 lb

## 2021-09-11 DIAGNOSIS — I495 Sick sinus syndrome: Secondary | ICD-10-CM

## 2021-09-11 DIAGNOSIS — I480 Paroxysmal atrial fibrillation: Secondary | ICD-10-CM | POA: Diagnosis not present

## 2021-09-11 NOTE — Progress Notes (Signed)
PCP: Rusty Aus, MD Primary Cardiologist: Dr Rockey Situ Primary EP:  Dr Tora Duck SEDA Hailey Simmons is a 85 y.o. female who presents today for further AF discussions.  She has had atrial fibrillation for several years.  This was initially controlled with flecainide.  She has had increasing frequency and duration of atrial fibrillation.  + palpitations and fatigue.  + decreased exercise tolerance during afib.  She has episodes several times per month, lasting up to 10 hours.     Today, she denies symptoms of palpitations, chest pain, shortness of breath,  lower extremity edema, dizziness, presyncope, or syncope.  The patient is otherwise without complaint today.   Past Medical History:  Diagnosis Date   Asthma 1970's - 1990's   a. ? 2/2 wood stove usage.   Basal cell carcinoma of skin of nose    Diverticulosis    Goiter    History of chicken pox    Hx of colonic polyps    Hypertension    Hypothyroidism    Menopause    PAF (paroxysmal atrial fibrillation) ([place])    on apixoban   Sinus node dysfunction ([place])    a. s/p MDT dual chamber pacemaker   Small bowel obstruction ([place]) 08/2009   Past Surgical History:  Procedure Laterality Date   CATARACT EXTRACTION  02/2019   CATARACT EXTRACTION  04/2019   COLONOSCOPY     COLONOSCOPY WITH PROPOFOL N/A 12/02/2018   Procedure: COLONOSCOPY WITH PROPOFOL;  Surgeon: Lollie Sails, MD;  Location: [facility] ENDOSCOPY;  Service: Endoscopy;  Laterality: N/A;   EP IMPLANTABLE DEVICE N/A 06/22/2015   MDT MRI compatible dual chamber PPM implanted by Dr Caryl Comes for symptomatic bradycardia   INSERT / REPLACE / REMOVE PACEMAKER      ROS- all systems are reviewed and negative except as per HPI above  Current Outpatient Medications  Medication Sig Dispense Refill   atorvastatin (LIPITOR) 10 MG tablet TAKE ONE (1) TABLET EACH DAY 90 tablet 3   calcium carbonate (OS-CAL) 600 MG TABS Take 600 mg by mouth 2 (two) times daily with a meal.     Cholecalciferol  (VITAMIN D3) 2000 UNITS TABS Take 1 tablet by mouth daily.     clobetasol ointment (TEMOVATE) 0.05 % Apply topically as needed.     Cranberry 1000 MG CAPS Take 1 capsule by mouth daily.      diltiazem (CARDIZEM CD) 240 MG 24 hr capsule Take 240 mg by mouth at bedtime.     ELIQUIS 5 MG TABS tablet TAKE ONE TABLET BY MOUTH TWICE DAILY 180 tablet 1   ESTRACE VAGINAL 0.1 MG/GM vaginal cream Place 1 Applicatorful vaginally as needed.      flecainide (TAMBOCOR) 50 MG tablet Take 1 tablet (50 mg total) by mouth 2 (two) times daily. 180 tablet 2   furosemide (LASIX) 20 MG tablet Take 1 tablet (20 mg total) by mouth daily as needed. 30 tablet 2   hydrALAZINE (APRESOLINE) 25 MG tablet Take 25 mg by mouth 2 (two) times daily.     levothyroxine (SYNTHROID, LEVOTHROID) 100 MCG tablet Take 100 mcg by mouth daily.     metoprolol succinate (TOPROL-XL) 25 MG 24 hr tablet TAKE ONE TABLET EVERY DAY 90 tablet 2   nitrofurantoin, macrocrystal-monohydrate, (MACROBID) 100 MG capsule Take 100 mg by mouth daily as needed.     olmesartan (BENICAR) 40 MG tablet Take 40 mg by mouth daily.     omeprazole (PRILOSEC) 40 MG capsule Take 40 mg  by mouth daily as needed.     polyethylene glycol (MIRALAX / GLYCOLAX) 17 g packet Take 17 g by mouth daily.     sodium chloride 1 g tablet Take by mouth. Rarely taken per pt     traZODone (DESYREL) 50 MG tablet Take 50 mg by mouth at bedtime as needed.     No current facility-administered medications for this visit.    Physical Exam: Vitals:   09/11/21 1517  BP: (!) 142/80  Pulse: 82  SpO2: 98%  Weight: 164 lb (74.4 kg)  Height: 5\' 1"  (1.549 m)    GEN- The patient is well appearing, alert and oriented x 3 today.   Head- normocephalic, atraumatic Eyes-  Sclera clear, conjunctiva pink Ears- hearing intact Oropharynx- clear Lungs- Clear to ausculation bilaterally, normal work of breathing Chest- pacemaker pocket is well healed Heart- Regular rate and rhythm, no murmurs,  rubs or gallops, PMI not laterally displaced GI- soft, NT, ND, + BS Extremities- no clubbing, cyanosis, or edema  Pacemaker interrogation- reviewed in detail today,  See PACEART report  ekg tracing ordered today is personally reviewed and shows sinus with RBBB  Assessment and Plan:  1. Symptomatic sinus bradycardia  Normal pacemaker function See Pace Art report No changes today she is not device dependant today  2. Paroxysmal atrial fibrillation The patient has symptomatic, recurrent  atrial fibrillation. she has failed medical therapy with flecainide. Chads2vasc score is at least 4.  she is anticoagulated with eliquis . Therapeutic strategies for afib including medicine and ablation were discussed in detail with the patient today. Risk, benefits, and alternatives to EP study and radiofrequency ablation for afib were also discussed in detail today. These risks include but are not limited to stroke, bleeding, vascular damage, tamponade, perforation, damage to the esophagus, lungs, and other structures, pulmonary vein stenosis, worsening renal function, pacemaker lead dislodgement, and death. The patient understands these risk and wishes to proceed.  We will therefore proceed with catheter ablation at the next available time.  Carto, ICE, anesthesia are requested for the procedure.  Will also obtain cardiac CT prior to the procedure to exclude LAA thrombus and further evaluate atrial anatomy.  2. HTN Stable Continue benicar 40mg  daily  3. HL Continue lipitor 10 mg daily  Thompson Grayer MD, [facility] 09/11/2021 3:44 PM

## 2021-09-11 NOTE — Patient Instructions (Addendum)
Medication Instructions:  Your physician recommends that you continue on your current medications as directed. Please refer to the Current Medication list given to you today. *If you need a refill on your cardiac medications before your next appointment, please call your pharmacy*  Lab Work: None. If you have labs (blood work) drawn today and your tests are completely normal, you will receive your results only by: [place] (if you have MyChart) OR A paper copy in the mail If you have any lab test that is abnormal or we need to change your treatment, we will call you to review the results.  Testing/Procedures: Your physician has requested that you have cardiac CT. Cardiac computed tomography (CT) is a painless test that uses an x-ray machine to take clear, detailed pictures of your heart. For further information please visit [email or web address]. Please follow instruction sheet as given.  Your physician has recommended that you have an ablation. Catheter ablation is a medical procedure used to treat some cardiac arrhythmias (irregular heartbeats). During catheter ablation, a long, thin, flexible tube is put into a blood vessel in your groin (upper thigh), or neck. This tube is called an ablation catheter. It is then guided to your heart through the blood vessel. Radio frequency waves destroy small areas of heart tissue where abnormal heartbeats may cause an arrhythmia to start. Please see the instruction sheet given to you today.;  Follow-Up: At [facility], you and your health needs are our priority.  As part of our continuing mission to provide you with exceptional heart care, we have created designated Provider Care Teams.  These Care Teams include your primary Cardiologist (physician) and Advanced Practice Providers (APPs -  Physician Assistants and Nurse Practitioners) who all work together to provide you with the care you need, when you need it.  Remote monitoring is used to monitor  your Pacemaker from home. This monitoring reduces the number of office visits required to check your device to one time per year. It allows [place] to keep an eye on the functioning of your device to ensure it is working properly. You are scheduled for a device check from home on 10/18/21. You may send your transmission at any time that day. If you have a wireless device, the transmission will be sent automatically. After your physician reviews your transmission, you will receive a postcard with your next transmission date.  We recommend signing up for the patient portal called "MyChart".  Sign up information is provided on this After Visit Summary.  MyChart is used to connect with patients for Virtual Visits (Telemedicine).  Patients are able to view lab/test results, encounter notes, upcoming appointments, etc.  Non-urgent messages can be sent to your provider as well.   To learn more about what you can do with MyChart, go to [email or web address].    Any Other Special Instructions Will Be Listed Below (If Applicable).  Cardiac Ablation Cardiac ablation is a procedure to destroy (ablate) some heart tissue that is sending bad signals. These bad signals cause problems in heart rhythm. The heart has many areas that make these signals. If there are problems in these areas, they can make the heart beat in a way that is not normal. Destroying some tissues can help make the heart rhythm normal. Tell your doctor about: Any allergies you have. All medicines you are taking. These include vitamins, herbs, eye drops, creams, and over-the-counter medicines. Any problems you or family members have had with medicines that make you fall asleep (anesthetics). Any  blood disorders you have. Any surgeries you have had. Any medical conditions you have, such as kidney failure. Whether you are pregnant or may be pregnant. What are the risks? This is a safe procedure. But problems may occur,  including: Infection. Bruising and bleeding. Bleeding into the chest. Stroke or blood clots. Damage to nearby areas of your body. Allergies to medicines or dyes. The need for a pacemaker if the normal system is damaged. Failure of the procedure to treat the problem. What happens before the procedure? Medicines Ask your doctor about: Changing or stopping your normal medicines. This is important. Taking aspirin and ibuprofen. Do not take these medicines unless your doctor tells you to take them. Taking other medicines, vitamins, herbs, and supplements. General instructions Follow instructions from your doctor about what you cannot eat or drink. Plan to have someone take you home from the hospital or clinic. If you will be going home right after the procedure, plan to have someone with you for 24 hours. Ask your doctor what steps will be taken to prevent infection. What happens during the procedure?  An IV tube will be put into one of your veins. You will be given a medicine to help you relax. The skin on your neck or groin will be numbed. A cut (incision) will be made in your neck or groin. A needle will be put through your cut and into a large vein. A tube (catheter) will be put into the needle. The tube will be moved to your heart. Dye may be put through the tube. This helps your doctor see your heart. Small devices (electrodes) on the tube will send out signals. A type of energy will be used to destroy some heart tissue. The tube will be taken out. Pressure will be held on your cut. This helps stop bleeding. A bandage will be put over your cut. The exact procedure may vary among doctors and hospitals. What happens after the procedure? You will be watched until you leave the hospital or clinic. This includes checking your heart rate, breathing rate, oxygen, and blood pressure. Your cut will be watched for bleeding. You will need to lie still for a few hours. Do not drive for 24  hours or as long as your doctor tells you. Summary Cardiac ablation is a procedure to destroy some heart tissue. This is done to treat heart rhythm problems. Tell your doctor about any medical conditions you may have. Tell him or her about all medicines you are taking to treat them. This is a safe procedure. But problems may occur. These include infection, bruising, bleeding, and damage to nearby areas of your body. Follow what your doctor tells you about food and drink. You may also be told to change or stop some of your medicines. After the procedure, do not drive for 24 hours or as long as your doctor tells you. This information is not intended to replace advice given to you by your health care provider. Make sure you discuss any questions you have with your health care provider. Document Revised: 10/22/2019 Document Reviewed: 10/22/2019 Elsevier Patient Education  2022 [employer].

## 2021-09-27 DIAGNOSIS — D500 Iron deficiency anemia secondary to blood loss (chronic): Secondary | ICD-10-CM | POA: Diagnosis not present

## 2021-09-27 DIAGNOSIS — E538 Deficiency of other specified B group vitamins: Secondary | ICD-10-CM | POA: Diagnosis not present

## 2021-09-29 DIAGNOSIS — D500 Iron deficiency anemia secondary to blood loss (chronic): Secondary | ICD-10-CM | POA: Diagnosis not present

## 2021-09-29 DIAGNOSIS — R0602 Shortness of breath: Secondary | ICD-10-CM | POA: Diagnosis not present

## 2021-09-29 DIAGNOSIS — I480 Paroxysmal atrial fibrillation: Secondary | ICD-10-CM | POA: Diagnosis not present

## 2021-09-29 DIAGNOSIS — R0600 Dyspnea, unspecified: Secondary | ICD-10-CM | POA: Diagnosis not present

## 2021-09-29 DIAGNOSIS — D649 Anemia, unspecified: Secondary | ICD-10-CM | POA: Diagnosis not present

## 2021-10-04 NOTE — Telephone Encounter (Signed)
Canceled Ablation and all testing/appointments that are related.

## 2021-10-16 ENCOUNTER — Other Ambulatory Visit: Payer: PPO

## 2021-10-16 ENCOUNTER — Other Ambulatory Visit: Payer: Self-pay | Admitting: Cardiovascular Disease

## 2021-10-17 DIAGNOSIS — D649 Anemia, unspecified: Secondary | ICD-10-CM | POA: Diagnosis not present

## 2021-10-17 DIAGNOSIS — K219 Gastro-esophageal reflux disease without esophagitis: Secondary | ICD-10-CM | POA: Diagnosis not present

## 2021-10-17 DIAGNOSIS — Z86010 Personal history of colonic polyps: Secondary | ICD-10-CM | POA: Diagnosis not present

## 2021-10-18 ENCOUNTER — Ambulatory Visit (INDEPENDENT_AMBULATORY_CARE_PROVIDER_SITE_OTHER): Payer: PPO

## 2021-10-18 DIAGNOSIS — I495 Sick sinus syndrome: Secondary | ICD-10-CM | POA: Diagnosis not present

## 2021-10-18 DIAGNOSIS — D500 Iron deficiency anemia secondary to blood loss (chronic): Secondary | ICD-10-CM | POA: Diagnosis not present

## 2021-10-18 DIAGNOSIS — I480 Paroxysmal atrial fibrillation: Secondary | ICD-10-CM | POA: Diagnosis not present

## 2021-10-18 LAB — CUP PACEART REMOTE DEVICE CHECK
Battery Remaining Longevity: 49 mo
Battery Voltage: 2.98 V
Brady Statistic AP VP Percent: 0.24 %
Brady Statistic AP VS Percent: 78.74 %
Brady Statistic AS VP Percent: 5.14 %
Brady Statistic AS VS Percent: 15.88 %
Brady Statistic RA Percent Paced: 77.69 %
Brady Statistic RV Percent Paced: 5.25 %
Date Time Interrogation Session: 20221116110708
Implantable Lead Implant Date: 20160720
Implantable Lead Implant Date: 20160720
Implantable Lead Location: 753859
Implantable Lead Location: 753860
Implantable Lead Model: 5076
Implantable Lead Model: 5076
Implantable Pulse Generator Implant Date: 20160720
Lead Channel Impedance Value: 342 Ohm
Lead Channel Impedance Value: 456 Ohm
Lead Channel Impedance Value: 513 Ohm
Lead Channel Impedance Value: 551 Ohm
Lead Channel Pacing Threshold Amplitude: 0.75 V
Lead Channel Pacing Threshold Amplitude: 0.75 V
Lead Channel Pacing Threshold Pulse Width: 0.4 ms
Lead Channel Pacing Threshold Pulse Width: 0.4 ms
Lead Channel Sensing Intrinsic Amplitude: 1.875 mV
Lead Channel Sensing Intrinsic Amplitude: 1.875 mV
Lead Channel Sensing Intrinsic Amplitude: 7.875 mV
Lead Channel Sensing Intrinsic Amplitude: 7.875 mV
Lead Channel Setting Pacing Amplitude: 1.5 V
Lead Channel Setting Pacing Amplitude: 2.5 V
Lead Channel Setting Pacing Pulse Width: 0.4 ms
Lead Channel Setting Sensing Sensitivity: 1.2 mV

## 2021-10-23 ENCOUNTER — Telehealth: Payer: Self-pay | Admitting: Internal Medicine

## 2021-10-23 ENCOUNTER — Telehealth: Payer: Self-pay | Admitting: *Deleted

## 2021-10-23 DIAGNOSIS — D649 Anemia, unspecified: Secondary | ICD-10-CM | POA: Diagnosis not present

## 2021-10-23 NOTE — Telephone Encounter (Signed)
error 

## 2021-10-23 NOTE — Telephone Encounter (Signed)
    Patient Name: Hailey Simmons  DOB: [birth year] MRN: [id number]  Primary Cardiologist: Ida Rogue, MD  Chart reviewed as part of pre-operative protocol coverage.   Dr. Rayann Heman, you saw the patient on 09/11/21. She was having symptomatic afib and plan was for ablation. However, no date yet.   Please give your recommendations colonoscopy on 10/31/21?  Pharmacy to review anticoagulation.      [place], [place] 10/23/2021, 1:24 PM

## 2021-10-23 NOTE — Telephone Encounter (Signed)
Sherry from [employer] called and said that its very important to speak [place] regarding patient

## 2021-10-23 NOTE — Telephone Encounter (Signed)
Call received from [facility] at [facility].  The patient is currently in the office to be scheduled for a colonoscopy on 10/31/21. They wanted to clarify her A-fib Ablation had been cancelled.  I advised Sheri: - patient seen by Dr. Sabra Heck (PCP) in August 2022 - was told her blood work was abnormal & he requested a colonoscopy go ahead and be repeated - patient could not be seen by GI until 10/17/21 - in the interim, was seen by Dr. Caryl Comes (09/05/21) & per MD note: She has oongoing problems with symptomatic atrial fibrillation  I have reached out to [facility] [facility] for consideration in this quite healthy 85 yo woman.   She also has FE eDef anemia for which eval with GI is scheduled but not yet until 11/22   I will reach out to see if can be adjusted if pt desires  - seen by Dr. Rayann Heman 10/10 & plans for a-fib ablation on 11/07/21 - after further consideration, the patient advised Dr. Caryl Comes she would prefer GI workup with colonscopy prior ablation as she has had some cells concerning for pre-cancer on prior colonoscopies.  - Dr. Caryl Comes and Dr. Sabra Heck spoke and the decision was made to cancel the ablation and proceed with colonscopy work up - seen by [facility] on 10/17/21 and advised colonscopy could be done February 2022.  Per Barbera Setters, the patient has been seen by [facility] surgery today as they are able to do her colonoscopy next week.   Sheri advised they will need pre-op clearance for the patient.

## 2021-10-23 NOTE — Telephone Encounter (Signed)
Wants to know if ablation has been canceled

## 2021-10-23 NOTE — Telephone Encounter (Signed)
Patient with diagnosis of afib on Eliquis for anticoagulation.    Procedure: colonoscopy Date of procedure: 10/31/21  CHA2DS2-VASc Score = 4  This indicates a 4.8% annual risk of stroke. The patient's score is based upon: CHF History: 0 HTN History: 1 Diabetes History: 0 Stroke History: 0 Vascular Disease History: 0 Age Score: 2 Gender Score: 1   CrCl 79mL/min using adjusted body weight Platelet count 185K  Was scheduled for ablation in December but this has been canceled, pt wants colonoscopy done first per 11/31 MyChart message.  Per office protocol, patient can hold Eliquis for 2 days prior to procedure as requested.

## 2021-10-23 NOTE — Telephone Encounter (Signed)
I called Sherrie with [facility] in regard to asking/confirming if the ablation for the pt was cancelled or not cancelled. In previous notes per Dr. Rayann Heman he states the ablation needs to be done before the colonoscopy. In regard to this, I gave the suggestion that [place] probably should d/w Nira Conn, RN for for Dr. Caryl Comes in the [facility] office as to if ablation is cancelled. I did state that RN did send a MY CHART to the pt on 10/04/21 about ablation being cancelled. I did say once it has been determined if ablation has been cancelled an if ok then to proceed with getting cardiac clearance for colonoscopy. Sherrie gave verbal understanding to plan of care. I did ask to please update the pre op pool as to what decision has been made.

## 2021-10-23 NOTE — Telephone Encounter (Signed)
See phone note

## 2021-10-23 NOTE — Telephone Encounter (Signed)
   [facility] Pre-operative Risk Assessment    Patient Name: Hailey Simmons  DOB: [birth year] MRN: [id number]  HEARTCARE STAFF:  - IMPORTANT!!!!!! Under Visit Info/Reason for Call, type in Other and utilize the format Clearance MM/DD/YY or Clearance TBD. Do not use dashes or single digits. - Please review there is not already an duplicate clearance open for this procedure. - If request is for dental extraction, please clarify the # of teeth to be extracted. - If the patient is currently at the dentist's office, call Pre-Op Callback Staff (MA/nurse) to input urgent request.  - If the patient is not currently in the dentist office, please route to the Pre-Op pool.  Request for surgical clearance:  What type of surgery is being performed? Colonoscopy  When is this surgery scheduled? 10/31/21  What type of clearance is required (medical clearance vs. Pharmacy clearance to hold med vs. Both)? both  Are there any medications that need to be held prior to surgery and how long? Eliquis x 2 days prior  Practice name and name of physician performing surgery? [facility]- Dr. Lysle Pearl  What is the office phone number? [phone]   7.   What is the office fax number? [phone]  8.   Anesthesia type (None, local, MAC, general) ? Propofol   Alvis Lemmings 10/23/2021, 12:45 PM  _________________________________________________________________   (provider comments below)

## 2021-10-24 NOTE — Telephone Encounter (Signed)
    Patient Name: Hailey Simmons  DOB: February 24, 1936 MRN: [id number]  Primary Cardiologist: Ida Rogue, MD  Chart reviewed as part of pre-operative protocol coverage. Reviewed notes below and reviewed MyChart message from 10/02/2021. Per Dr. Caryl Comes, okay to postpone atrial fibrillation ablation and proceed with colonoscopy first. Per Pharmacy and office protocol, patient can hold Eliquis for 2 days prior to procedure. Please restart this as soon as safely possible following colonoscopy.   I will route this recommendation to the requesting party via Epic fax function and remove from pre-op pool.  Please call with questions.  Darreld Mclean, PA-C 10/24/2021, 9:07 AM

## 2021-10-30 NOTE — Progress Notes (Signed)
Remote pacemaker transmission.   

## 2021-10-31 ENCOUNTER — Ambulatory Visit (HOSPITAL_COMMUNITY): Payer: PPO

## 2021-10-31 DIAGNOSIS — K635 Polyp of colon: Secondary | ICD-10-CM | POA: Diagnosis not present

## 2021-10-31 DIAGNOSIS — K5730 Diverticulosis of large intestine without perforation or abscess without bleeding: Secondary | ICD-10-CM | POA: Diagnosis not present

## 2021-10-31 DIAGNOSIS — D649 Anemia, unspecified: Secondary | ICD-10-CM | POA: Diagnosis not present

## 2021-10-31 DIAGNOSIS — D509 Iron deficiency anemia, unspecified: Secondary | ICD-10-CM | POA: Diagnosis not present

## 2021-10-31 DIAGNOSIS — D125 Benign neoplasm of sigmoid colon: Secondary | ICD-10-CM | POA: Diagnosis not present

## 2021-11-07 ENCOUNTER — Encounter (HOSPITAL_COMMUNITY): Payer: Self-pay

## 2021-11-07 ENCOUNTER — Ambulatory Visit (HOSPITAL_COMMUNITY): Admit: 2021-11-07 | Payer: PPO | Admitting: Internal Medicine

## 2021-11-07 DIAGNOSIS — D500 Iron deficiency anemia secondary to blood loss (chronic): Secondary | ICD-10-CM | POA: Diagnosis not present

## 2021-11-07 SURGERY — ATRIAL FIBRILLATION ABLATION
Anesthesia: General

## 2021-12-05 ENCOUNTER — Ambulatory Visit (HOSPITAL_COMMUNITY): Payer: PPO | Admitting: Physician Assistant

## 2021-12-09 IMAGING — MG MM DIGITAL SCREENING BILAT W/ TOMO AND CAD
6 of 10 series · 6 of 30 positions shown · non-contrast
Comparison: Previous exam(s).

CLINICAL DATA: Screening.

EXAM:
DIGITAL SCREENING BILATERAL MAMMOGRAM WITH TOMOSYNTHESIS AND CAD
TECHNIQUE: Bilateral screening digital craniocaudal and mediolateral oblique
mammograms were obtained. Bilateral screening digital breast
tomosynthesis was performed. The images were evaluated with
computer-aided detection.

[L CC synth-2D]
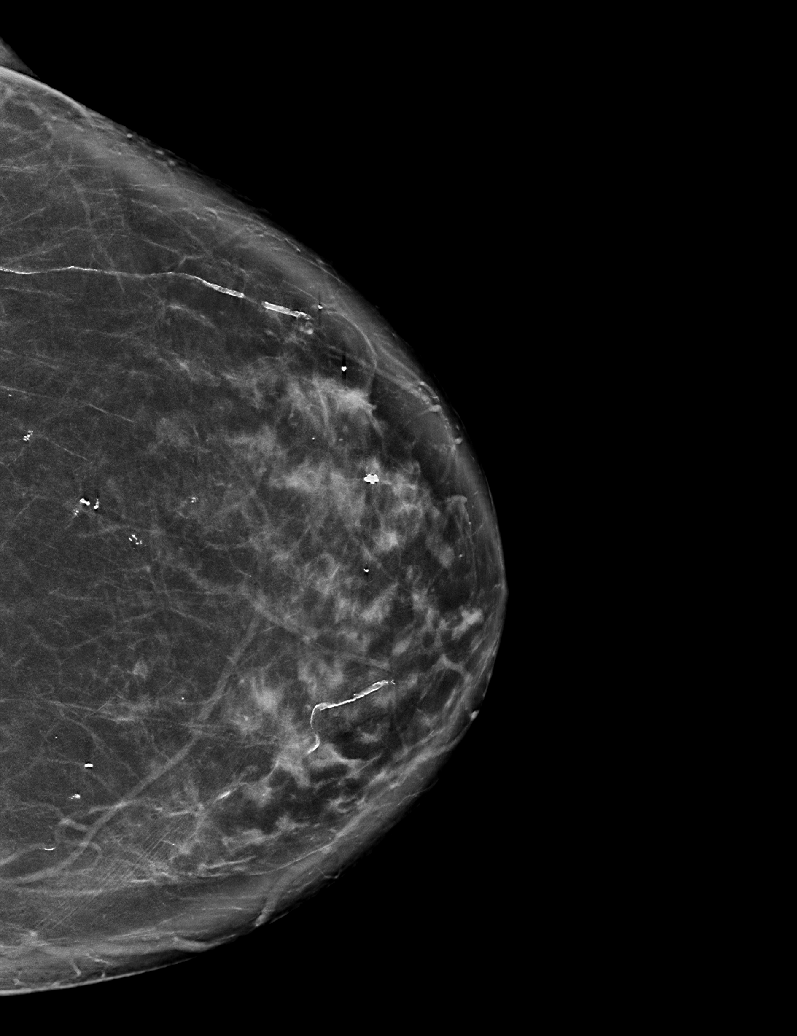

[R CC synth-2D]
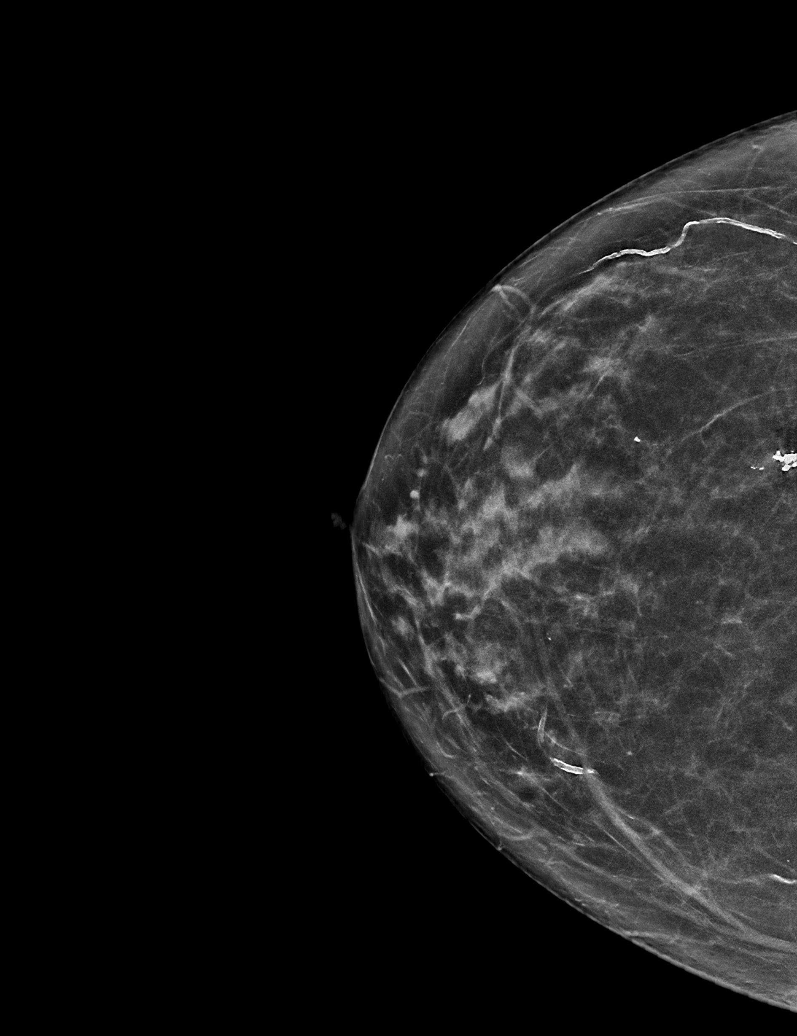

[R MLO synth-2D (1 of 2)]
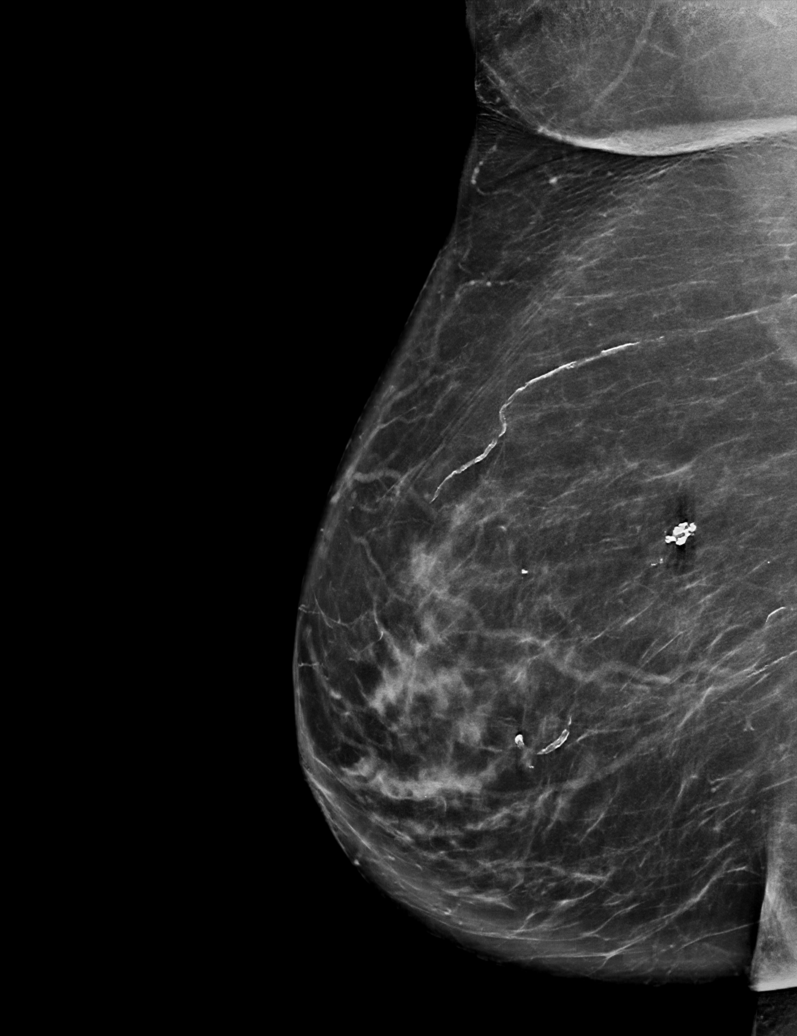

[L MLO synth-2D]
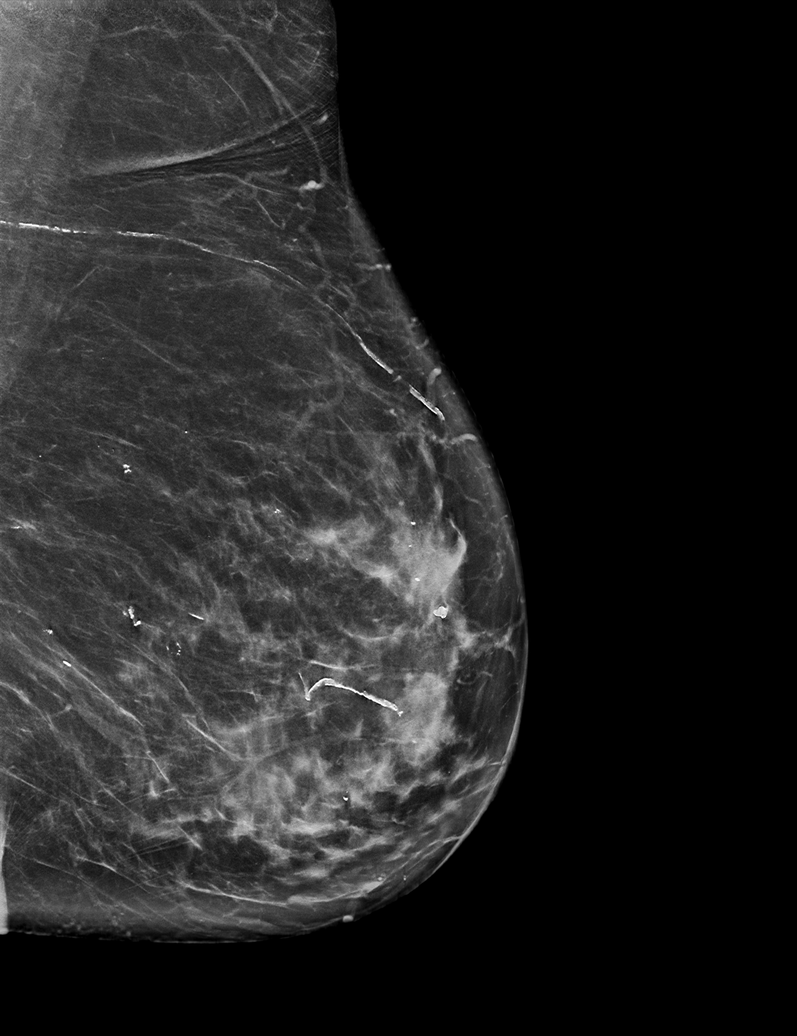

[R MLO synth-2D (2 of 2)]
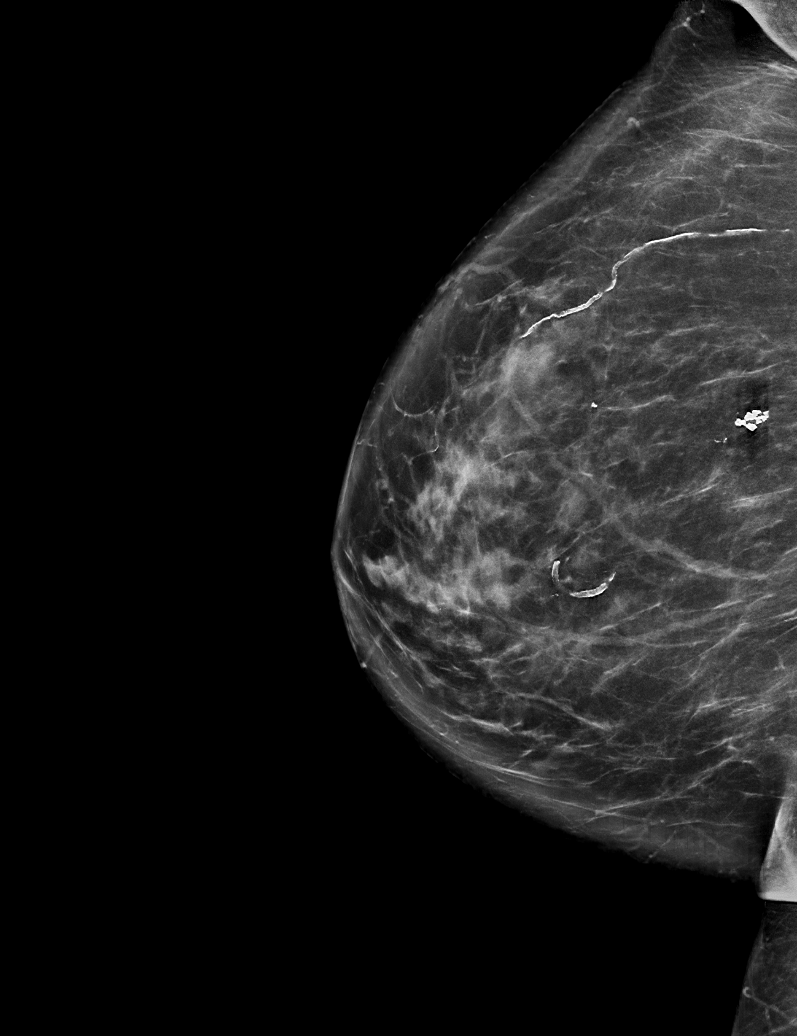

[R CC tomo · tomo slice 31/62.0]
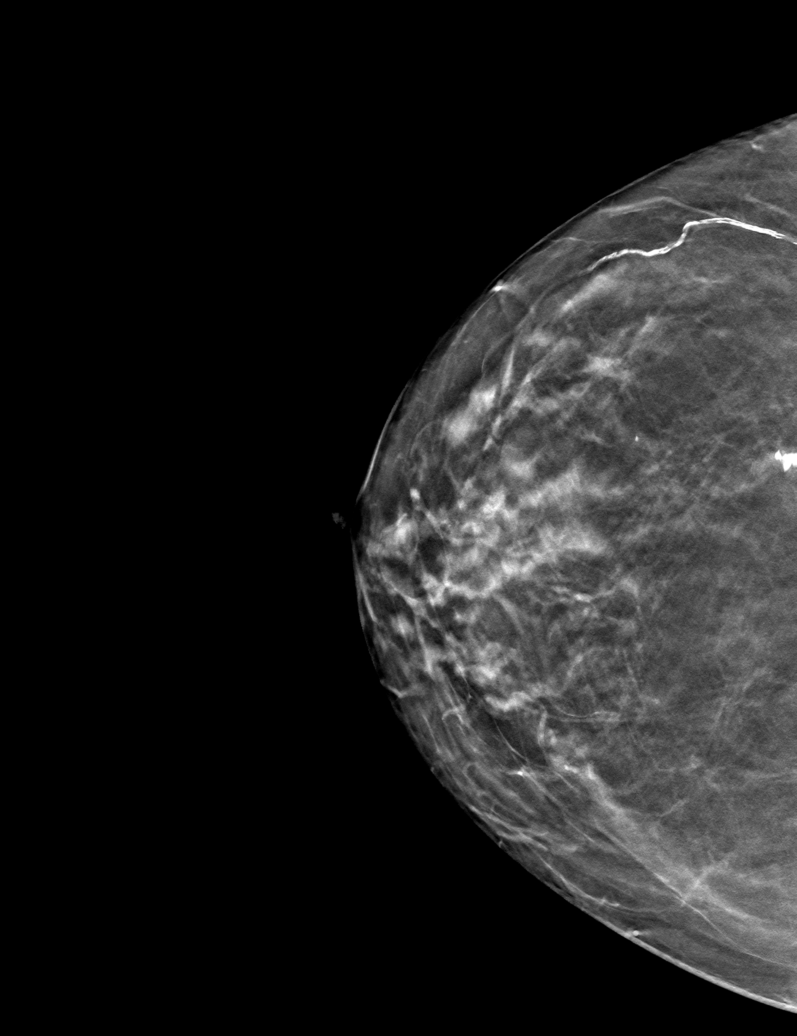

[6 of 30 positions shown; findings below may reference images not displayed]

ACR Breast Density Category b: There are scattered areas of
fibroglandular density.
FINDINGS: There are no findings suspicious for malignancy. The images were
evaluated with computer-aided detection.
IMPRESSION: No mammographic evidence of malignancy. A result letter of this
screening mammogram will be mailed directly to the patient.

RECOMMENDATION:
Screening mammogram in one year. (Code:WJ-I-BG6)

BI-RADS CATEGORY  1: Negative.

## 2021-12-20 DIAGNOSIS — D500 Iron deficiency anemia secondary to blood loss (chronic): Secondary | ICD-10-CM | POA: Diagnosis not present

## 2021-12-21 DIAGNOSIS — D485 Neoplasm of uncertain behavior of skin: Secondary | ICD-10-CM | POA: Diagnosis not present

## 2021-12-21 DIAGNOSIS — L309 Dermatitis, unspecified: Secondary | ICD-10-CM | POA: Diagnosis not present

## 2021-12-31 ENCOUNTER — Encounter: Payer: Self-pay | Admitting: Internal Medicine

## 2022-01-02 ENCOUNTER — Encounter: Payer: Self-pay | Admitting: Internal Medicine

## 2022-01-02 ENCOUNTER — Ambulatory Visit (INDEPENDENT_AMBULATORY_CARE_PROVIDER_SITE_OTHER): Payer: PPO | Admitting: Internal Medicine

## 2022-01-02 ENCOUNTER — Other Ambulatory Visit: Payer: Self-pay

## 2022-01-02 VITALS — BP 136/70 | HR 66 | Ht 61.0 in | Wt 167.0 lb

## 2022-01-02 DIAGNOSIS — I495 Sick sinus syndrome: Secondary | ICD-10-CM | POA: Diagnosis not present

## 2022-01-02 DIAGNOSIS — I480 Paroxysmal atrial fibrillation: Secondary | ICD-10-CM

## 2022-01-02 LAB — PACEMAKER DEVICE OBSERVATION

## 2022-01-02 NOTE — Patient Instructions (Signed)

## 2022-01-02 NOTE — Progress Notes (Signed)
Patient Care Team: Rusty Aus, MD as PCP - General (Unknown Physician Specialty) [name], MD as PCP - Cardiology (Cardiology) [name], MD as Consulting Physician (Cardiology)   HPI  Hailey Simmons is a 86 y.o. female Seen in followup for  Medtronic pacer implanted (2016)  for sinus node dysfunction and PAF Rx with flecainide and anticoagulation with apixaban   Hospitalized 7/20 with severe hyponatremia (118); diuretics were discontinued.     Some recurrent Afib assoc with generalized malaise.  Had discussions regarding catheter ablation, was scheduled with Dr Greggory Brandy, but she chose to defer because of concerns regarding GI polyps for which she underwent colonoscopy 11/22>>benign polyps, diverticulosis.    The patient denies chest pain, shortness of breath, nocturnal dyspnea, orthopnea or peripheral edema.  There have been no palpitations, lightheadedness or syncope.     No bleeding    DATE TEST EF   2/13    Echo  60 %   8/17    Myoview     55-60 % No Ischemia        Date Cr NA K TSH Hgb  2/18 0.9    12.6  8/18  0.75    12.5  2/19 0.8    12.8  7/20 .57 118   11.2>>11.9  10/20  137     2/21 0.7 137  1.05 13.5  8/21 0.8 136 4.1 6.29 12.4  3/22 0.8 138 4.7 0.9 12.9  8/22 0.9 142 4.3 0.497 (10/22) 11.9  1/23     11.5     DATE PR duration QRSduration Dose-flecianide  3/13 160 134 0  7/15  158 50  10/16 180 154 100  6/17`  146 100  7/18 (A paced) 280 140 100  8/19 Apaced 240 142 100   9/20 Apaced 236 154 100  4/21 Apaced 260 150 100  10/21 Apace 280 170 100>>75   11/21 Apace 304 170 75>>50  3/22 Apace 275 144 50  1/23 Apace 240 154 50   Thromboembolic risk factors ( age -34, HTN-1, Gender-1) for a CHADSVASc Score of 4   Past Medical History:  Diagnosis Date   Asthma 1970's - 1990's   a. ? 2/2 wood stove usage.   Basal cell carcinoma of skin of nose    Diverticulosis    Goiter    History of chicken pox    Hx of colonic polyps     Hypertension    Hypothyroidism    Menopause    PAF (paroxysmal atrial fibrillation) ([place])    on apixoban   Sinus node dysfunction ([place])    a. s/p MDT dual chamber pacemaker   Small bowel obstruction ([place]) 08/2009    Past Surgical History:  Procedure Laterality Date   CATARACT EXTRACTION  02/2019   CATARACT EXTRACTION  04/2019   COLONOSCOPY     COLONOSCOPY WITH PROPOFOL N/A 12/02/2018   Procedure: COLONOSCOPY WITH PROPOFOL;  Surgeon: Lollie Sails, MD;  Location: [facility] ENDOSCOPY;  Service: Endoscopy;  Laterality: N/A;   EP IMPLANTABLE DEVICE N/A 06/22/2015   MDT MRI compatible dual chamber PPM implanted by Dr Caryl Comes for symptomatic bradycardia   INSERT / REPLACE / REMOVE PACEMAKER      Current Outpatient Medications  Medication Sig Dispense Refill   atorvastatin (LIPITOR) 10 MG tablet TAKE ONE (1) TABLET EACH DAY 90 tablet 3   calcium carbonate (OS-CAL) 600 MG TABS Take 600 mg by mouth 2 (two) times daily  with a meal.     Cholecalciferol (VITAMIN D3) 2000 UNITS TABS Take 1 tablet by mouth daily.     clobetasol ointment (TEMOVATE) 0.05 % Apply topically as needed.     Cranberry 1000 MG CAPS Take 1 capsule by mouth daily.      diltiazem (CARDIZEM CD) 240 MG 24 hr capsule Take 240 mg by mouth at bedtime.     ELIQUIS 5 MG TABS tablet TAKE ONE TABLET BY MOUTH TWICE DAILY 180 tablet 1   ESTRACE VAGINAL 0.1 MG/GM vaginal cream Place 1 Applicatorful vaginally as needed.      flecainide (TAMBOCOR) 50 MG tablet Take 1 tablet (50 mg total) by mouth 2 (two) times daily. 180 tablet 2   furosemide (LASIX) 20 MG tablet TAKE ONE TABLET EVERY DAY AS NEEDED 30 tablet 2   hydrALAZINE (APRESOLINE) 25 MG tablet Take 25 mg by mouth 2 (two) times daily.     levothyroxine (SYNTHROID, LEVOTHROID) 100 MCG tablet Take 100 mcg by mouth daily.     metoprolol succinate (TOPROL-XL) 25 MG 24 hr tablet TAKE ONE TABLET EVERY DAY 90 tablet 2   nitrofurantoin, macrocrystal-monohydrate, (MACROBID) 100 MG capsule  Take 100 mg by mouth daily as needed.     olmesartan (BENICAR) 40 MG tablet Take 40 mg by mouth daily.     omeprazole (PRILOSEC) 40 MG capsule Take 40 mg by mouth daily as needed.     polyethylene glycol (MIRALAX / GLYCOLAX) 17 g packet Take 17 g by mouth daily.     sodium chloride 1 g tablet Take by mouth. Rarely taken per pt     traZODone (DESYREL) 50 MG tablet Take 50 mg by mouth at bedtime as needed.     No current facility-administered medications for this visit.    Allergies  Allergen Reactions   Prednisone       Review of Systems negative except from HPI and PMH  Physical Exam  BP 136/70 (BP Location: Left Arm, Patient Position: Sitting, Cuff Size: Normal)    Pulse 66    Ht 5\' 1"  (1.549 m)    Wt 167 lb (75.8 kg)    SpO2 96%    BMI 31.55 kg/m  Well developed and well nourished in no acute distress HENT normal Neck supple with JVP-flat Clear Device pocket well healed; without hematoma or erythema.  There is no tethering  Regular rate and rhythm, no  gallop No murmur Abd-soft with active BS No Clubbing cyanosis  edema Skin-warm and dry A & Oriented  Grossly normal sensory and motor function  ECG Apacing @ 66 24/15/47   Assessment and  Plan  Atrial fibrillation-persisting but mostly paroxysmal  Sinus node dysfunction  Pacemaker-Medtronic       Hyponatremia  Right bundle branch block/first-degree AV block  Hypertension    Atrial fibrillation burden continues to increase, about 11% since 10/22.  However, has increasingly few symptoms.  Discussed alternative strategies, but I think with the paucity of symptoms at this juncture would advise continuing the flecainide and not pursuing alternative antiarrhythmic therapy or catheter ablation.  In the event that symptoms recur, could consider catheter ablation or class III therapy.  No bleeding, we will continue her on Eliquis 5 mg twice daily  Blood pressure is well controlled.  We will continue her on Cardizem  240, Apresoline 25 twice daily, metoprolol 25 daily and Benicar 40.

## 2022-01-03 DIAGNOSIS — L928 Other granulomatous disorders of the skin and subcutaneous tissue: Secondary | ICD-10-CM | POA: Diagnosis not present

## 2022-01-03 DIAGNOSIS — D2262 Melanocytic nevi of left upper limb, including shoulder: Secondary | ICD-10-CM | POA: Diagnosis not present

## 2022-01-03 DIAGNOSIS — L900 Lichen sclerosus et atrophicus: Secondary | ICD-10-CM | POA: Diagnosis not present

## 2022-01-03 DIAGNOSIS — L821 Other seborrheic keratosis: Secondary | ICD-10-CM | POA: Diagnosis not present

## 2022-01-03 DIAGNOSIS — Z85828 Personal history of other malignant neoplasm of skin: Secondary | ICD-10-CM | POA: Diagnosis not present

## 2022-01-03 DIAGNOSIS — D2271 Melanocytic nevi of right lower limb, including hip: Secondary | ICD-10-CM | POA: Diagnosis not present

## 2022-01-03 DIAGNOSIS — D485 Neoplasm of uncertain behavior of skin: Secondary | ICD-10-CM | POA: Diagnosis not present

## 2022-01-04 ENCOUNTER — Ambulatory Visit: Admit: 2022-01-04 | Payer: PPO | Admitting: Gastroenterology

## 2022-01-04 SURGERY — COLONOSCOPY WITH PROPOFOL
Anesthesia: General

## 2022-01-17 ENCOUNTER — Ambulatory Visit (INDEPENDENT_AMBULATORY_CARE_PROVIDER_SITE_OTHER): Payer: PPO

## 2022-01-17 DIAGNOSIS — I495 Sick sinus syndrome: Secondary | ICD-10-CM | POA: Diagnosis not present

## 2022-01-17 LAB — CUP PACEART REMOTE DEVICE CHECK
Battery Remaining Longevity: 44 mo
Battery Voltage: 2.98 V
Brady Statistic AP VP Percent: 0.21 %
Brady Statistic AP VS Percent: 89.77 %
Brady Statistic AS VP Percent: 4.3 %
Brady Statistic AS VS Percent: 5.72 %
Brady Statistic RA Percent Paced: 89.03 %
Brady Statistic RV Percent Paced: 4.49 %
Date Time Interrogation Session: 20230215132325
Implantable Lead Implant Date: 20160720
Implantable Lead Implant Date: 20160720
Implantable Lead Location: 753859
Implantable Lead Location: 753860
Implantable Lead Model: 5076
Implantable Lead Model: 5076
Implantable Pulse Generator Implant Date: 20160720
Lead Channel Impedance Value: 323 Ohm
Lead Channel Impedance Value: 437 Ohm
Lead Channel Impedance Value: 494 Ohm
Lead Channel Impedance Value: 532 Ohm
Lead Channel Pacing Threshold Amplitude: 0.75 V
Lead Channel Pacing Threshold Amplitude: 0.75 V
Lead Channel Pacing Threshold Pulse Width: 0.4 ms
Lead Channel Pacing Threshold Pulse Width: 0.4 ms
Lead Channel Sensing Intrinsic Amplitude: 1.75 mV
Lead Channel Sensing Intrinsic Amplitude: 1.75 mV
Lead Channel Sensing Intrinsic Amplitude: 7.25 mV
Lead Channel Sensing Intrinsic Amplitude: 7.25 mV
Lead Channel Setting Pacing Amplitude: 1.75 V
Lead Channel Setting Pacing Amplitude: 2.5 V
Lead Channel Setting Pacing Pulse Width: 0.4 ms
Lead Channel Setting Sensing Sensitivity: 1.2 mV

## 2022-01-23 NOTE — Progress Notes (Signed)
Remote pacemaker transmission.   

## 2022-01-30 DIAGNOSIS — E538 Deficiency of other specified B group vitamins: Secondary | ICD-10-CM | POA: Diagnosis not present

## 2022-01-30 DIAGNOSIS — E782 Mixed hyperlipidemia: Secondary | ICD-10-CM | POA: Diagnosis not present

## 2022-01-30 DIAGNOSIS — D500 Iron deficiency anemia secondary to blood loss (chronic): Secondary | ICD-10-CM | POA: Diagnosis not present

## 2022-02-06 DIAGNOSIS — E782 Mixed hyperlipidemia: Secondary | ICD-10-CM | POA: Diagnosis not present

## 2022-02-06 DIAGNOSIS — D500 Iron deficiency anemia secondary to blood loss (chronic): Secondary | ICD-10-CM | POA: Diagnosis not present

## 2022-02-06 DIAGNOSIS — I480 Paroxysmal atrial fibrillation: Secondary | ICD-10-CM | POA: Diagnosis not present

## 2022-02-06 DIAGNOSIS — Z0000 Encounter for general adult medical examination without abnormal findings: Secondary | ICD-10-CM | POA: Diagnosis not present

## 2022-04-06 NOTE — Progress Notes (Signed)
InPatient ID: Hailey Simmons, female   DOB: 06/20/1936, 86 y.o.   MRN: [id number] ?Cardiology Office Note ? ?Date:  04/09/2022  ? ?ID:  Hailey Simmons, DOB [birth year], MRN [id number] ? ?PCP:  Rusty Aus, MD  ? ?Chief Complaint  ?Patient presents with  ? 9 month follow up   ?  "Doing well." Medications reviewed by the patient verbally.   ? ? ?HPI:  ?Hailey Simmons is a very pleasant 86 year old woman with history of hypothyroidism,  ?hyperlipidemia ,  ?paroxysmal atrial fibrillation January 2013  ?Pacer for sinus node dysfunction ?Echocardiogram showed normal ejection fraction ?Tolerating flecainide  ?CHA2DS2-VASc Score 4 ?She presents today for follow-up of her atrial fibrillation ? ?Last seen in clinic by myself May 2022 ?Seen by Dr. Caryl Comes January 2023 ?Atrial fibrillation burden continues to increase, about 11% since 10/22. ?Reported not having significant symptoms, maintained on flecainide, Eliquis, Cardizem, metoprolol ? ?Pacer download: Burden 7.9%, ?13 AF episodes, controlled ventricular rates, longest episode 13hrs  ? ?Afib "not as strong" as before, mild sx ?Sometimes can feel the afib,  ?Recently "does not last  along time" ?Does not feel she needs emergency pill ? ?Takes lasix sparingly, once a month on average ? ?Labs reviewed:  ?TSH 0.229 ?Total chol 137 LDL 58 ? ?No regular exercise program, denies shortness of breath or chest pain on exertion ?No recent falls ? ?Prior records reviewed ?End of 2021 flecainide dose decreased secondary to QRS prolongation, initially 75 down to 50 mg twice daily, adjustments through EP ? ?EKG personally reviewed by myself on todays visit ?Paced rhythm rate 66 bpm ? ?Other past medical history reviewed ?Difficult February 2022, had COVID, treated with prednisone ?During this time she reported increased atrial fibrillation burden ?This was confirmed on pacer download by EP ? ?Other past medical history reviewed in hospital 06/2019 ?Low sodium, 118 ?Since then has held her  diuretics ?Primary care has kept her on her salt supplement to maintain sodium level ? ? periodic urinary tract infections   ?She is typically very active, takes care of 3 grandchildren several days per week.   ? ?PMH:   has a past medical history of Asthma (1970's - 1990's), Basal cell carcinoma of skin of nose, Diverticulosis, Goiter, History of chicken pox, colonic polyps, Hypertension, Hypothyroidism, Menopause, PAF (paroxysmal atrial fibrillation) ([place]), Sinus node dysfunction ([place]), and Small bowel obstruction ([place]) (08/2009). ? ?PSH:    ?Past Surgical History:  ?Procedure Laterality Date  ? CATARACT EXTRACTION  02/2019  ? CATARACT EXTRACTION  04/2019  ? COLONOSCOPY    ? COLONOSCOPY WITH PROPOFOL N/A 12/02/2018  ? Procedure: COLONOSCOPY WITH PROPOFOL;  Surgeon: Lollie Sails, MD;  Location[facility] ENDOSCOPY;  Service: Endoscopy;  Laterality: N/A;  ? EP IMPLANTABLE DEVICE N/A 06/22/2015  ? MDT MRI compatible dual chamber PPM implanted by Dr Caryl Comes for symptomatic bradycardia  ? INSERT / REPLACE / REMOVE PACEMAKER    ? ? ?Current Outpatient Medications  ?Medication Sig Dispense Refill  ? atorvastatin (LIPITOR) 10 MG tablet TAKE ONE (1) TABLET EACH DAY 90 tablet 3  ? calcium carbonate (OS-CAL) 600 MG TABS Take 600 mg by mouth 2 (two) times daily with a meal.    ? Cholecalciferol (VITAMIN D3) 2000 UNITS TABS Take 1 tablet by mouth daily.    ? clobetasol ointment (TEMOVATE) 0.05 % Apply topically as needed.    ? Cranberry 1000 MG CAPS Take 1 capsule by mouth daily.     ? diltiazem (CARDIZEM CD) 240 MG  24 hr capsule Take 240 mg by mouth at bedtime.    ? ESTRACE VAGINAL 0.1 MG/GM vaginal cream Place 1 Applicatorful vaginally as needed.     ? flecainide (TAMBOCOR) 50 MG tablet Take 1 tablet (50 mg total) by mouth 2 (two) times daily. 180 tablet 2  ? furosemide (LASIX) 20 MG tablet TAKE ONE TABLET EVERY DAY AS NEEDED 30 tablet 2  ? hydrALAZINE (APRESOLINE) 25 MG tablet Take 25 mg by mouth 2 (two) times daily.    ?  levothyroxine (SYNTHROID, LEVOTHROID) 100 MCG tablet Take 100 mcg by mouth daily.    ? metoprolol succinate (TOPROL-XL) 25 MG 24 hr tablet TAKE ONE TABLET EVERY DAY 90 tablet 2  ? nitrofurantoin, macrocrystal-monohydrate, (MACROBID) 100 MG capsule Take 100 mg by mouth daily as needed.    ? olmesartan (BENICAR) 40 MG tablet Take 40 mg by mouth daily.    ? omeprazole (PRILOSEC) 40 MG capsule Take 40 mg by mouth daily as needed.    ? polyethylene glycol (MIRALAX / GLYCOLAX) 17 g packet Take 17 g by mouth daily.    ? sodium chloride 1 g tablet Take by mouth. Rarely taken per pt    ? traZODone (DESYREL) 50 MG tablet Take 50 mg by mouth at bedtime as needed.    ? apixaban (ELIQUIS) 5 MG TABS tablet Take 1 tablet (5 mg total) by mouth 2 (two) times daily. 180 tablet 3  ? ?No current facility-administered medications for this visit.  ? ? ?Allergies:   Prednisone  ? ?Social History:  The patient  reports that she has never smoked. She has never used smokeless tobacco. She reports that she does not drink alcohol and does not use drugs.  ? ?Family History:   family history includes Breast cancer in her maternal aunt; Heart attack in her father; Heart failure in her mother.  ? ? ?Review of Systems: ?Review of Systems  ?Constitutional: Negative.   ?HENT: Negative.    ?Respiratory: Negative.    ?Cardiovascular: Negative.   ?Gastrointestinal: Negative.   ?Musculoskeletal: Negative.   ?Neurological: Negative.   ?Psychiatric/Behavioral: Negative.    ?All other systems reviewed and are negative. ? ?PHYSICAL EXAM: ?VS:  BP (!) 118/54 (BP Location: Left Arm, Patient Position: Sitting, Cuff Size: Normal)   Pulse 66   Ht '5\' 2"'$  (1.575 m)   Wt 169 lb 2 oz (76.7 kg)   SpO2 99%   BMI 30.93 kg/m?  , BMI Body mass index is 30.93 kg/m?Marland Kitchen ?Constitutional:  oriented to person, place, and time. No distress.  ?HENT:  ?Head: Grossly normal ?Eyes:  no discharge. No scleral icterus.  ?Neck: No JVD, no carotid bruits  ?Cardiovascular: Regular rate  and rhythm, no murmurs appreciated ?Pulmonary/Chest: Clear to auscultation bilaterally, no wheezes or rails ?Abdominal: Soft.  no distension.  no tenderness.  ?Musculoskeletal: Normal range of motion ?Neurological:  normal muscle tone. Coordination normal. No atrophy ?Skin: Skin warm and dry ?Psychiatric: normal affect, pleasant ? ? ?Recent Labs: ?No results found for requested labs within last 8760 hours.  ? ? ?Lipid Panel ?No results found for: CHOL, HDL, LDLCALC, TRIG ?  ? ?Wt Readings from Last 3 Encounters:  ?04/09/22 169 lb 2 oz (76.7 kg)  ?01/02/22 167 lb (75.8 kg)  ?09/11/21 164 lb (74.4 kg)  ?  ? ? ?ASSESSMENT AND PLAN: ? ?PAF (paroxysmal atrial fibrillation) ([place]) - ?On diltiazem, metoprolol, flecainide eliquis ?Maintain normal sinus rhythm, ?She is relatively asymptomatic ?8% burden A-fib, ?Higher dose flecainide previously  reduced by EP. ?Does not feel that she needs emergency pill or higher dose of what she is currently taking as she is minimally symptomatic ?Not particularly interested  at this time and ablation ? ?Sinus node dysfunction (H[facility]) ?pacemaker, followed by Dr. Caryl Comes ?Paced rhythm today ?Results reviewed with her in detail ? ?Hyperlipidemia ?Cholesterol is at goal on the current lipid regimen. No changes to the medications were made. ? ?Essential hypertension ?Blood pressure is well controlled on today's visit. No changes made to the medications. ? ?Leg edema ?No significant edema, takes Lasix very sparingly ? ?Hyponatremia: ?Lasix once a week on average ?Eats out twice a week ?Number stable ? ? Total encounter time more than 30 minutes ? Greater than 50% was spent in counseling and coordination of care with the patient ? ? ? ?Orders Placed This Encounter  ?Procedures  ? EKG 12-Lead  ? ? ?Signed, ?Esmond Plants, M.D., Ph.D. ?04/09/2022  ?[place] ?[phone] ? ? ? ?

## 2022-04-09 ENCOUNTER — Encounter: Payer: Self-pay | Admitting: Cardiovascular Disease

## 2022-04-09 ENCOUNTER — Ambulatory Visit (INDEPENDENT_AMBULATORY_CARE_PROVIDER_SITE_OTHER): Payer: PPO | Admitting: Cardiovascular Disease

## 2022-04-09 VITALS — BP 118/54 | HR 66 | Ht 62.0 in | Wt 169.1 lb

## 2022-04-09 DIAGNOSIS — D649 Anemia, unspecified: Secondary | ICD-10-CM | POA: Diagnosis not present

## 2022-04-09 DIAGNOSIS — I10 Essential (primary) hypertension: Secondary | ICD-10-CM | POA: Diagnosis not present

## 2022-04-09 DIAGNOSIS — I480 Paroxysmal atrial fibrillation: Secondary | ICD-10-CM | POA: Diagnosis not present

## 2022-04-09 DIAGNOSIS — Z950 Presence of cardiac pacemaker: Secondary | ICD-10-CM

## 2022-04-09 DIAGNOSIS — E782 Mixed hyperlipidemia: Secondary | ICD-10-CM

## 2022-04-09 DIAGNOSIS — I495 Sick sinus syndrome: Secondary | ICD-10-CM

## 2022-04-09 MED ORDER — APIXABAN 5 MG PO TABS: 5.0000 mg | ORAL_TABLET | Freq: Two times a day (BID) | ORAL | 3 refills | Status: DC

## 2022-04-09 NOTE — Patient Instructions (Signed)
Medication Instructions:  No changes  If you need a refill on your cardiac medications before your next appointment, please call your pharmacy.   Lab work: No new labs needed  Testing/Procedures: No new testing needed  Follow-Up: At [facility], you and your health needs are our priority.  As part of our continuing mission to provide you with exceptional heart care, we have created designated Provider Care Teams.  These Care Teams include your primary Cardiologist (physician) and Advanced Practice Providers (APPs -  Physician Assistants and Nurse Practitioners) who all work together to provide you with the care you need, when you need it.  You will need a follow up appointment in 12 months  Providers on your designated Care Team:   Christopher Berge, NP Ryan Dunn, PA-C Cadence Furth, PA-C  COVID-19 Vaccine Information can be found at: [email or web address] For questions related to vaccine distribution or appointments, please email [email or web address] or call [phone].   

## 2022-04-18 ENCOUNTER — Ambulatory Visit (INDEPENDENT_AMBULATORY_CARE_PROVIDER_SITE_OTHER): Payer: PPO

## 2022-04-18 DIAGNOSIS — I495 Sick sinus syndrome: Secondary | ICD-10-CM | POA: Diagnosis not present

## 2022-04-20 LAB — CUP PACEART REMOTE DEVICE CHECK
Battery Remaining Longevity: 41 mo
Battery Voltage: 2.97 V
Brady Statistic AP VP Percent: 0.25 %
Brady Statistic AP VS Percent: 85.86 %
Brady Statistic AS VP Percent: 5.75 %
Brady Statistic AS VS Percent: 8.14 %
Brady Statistic RA Percent Paced: 84.72 %
Brady Statistic RV Percent Paced: 5.94 %
Date Time Interrogation Session: 20230517162831
Implantable Lead Implant Date: 20160720
Implantable Lead Implant Date: 20160720
Implantable Lead Location: 753859
Implantable Lead Location: 753860
Implantable Lead Model: 5076
Implantable Lead Model: 5076
Implantable Pulse Generator Implant Date: 20160720
Lead Channel Impedance Value: 342 Ohm
Lead Channel Impedance Value: 437 Ohm
Lead Channel Impedance Value: 513 Ohm
Lead Channel Impedance Value: 532 Ohm
Lead Channel Pacing Threshold Amplitude: 0.75 V
Lead Channel Pacing Threshold Amplitude: 0.875 V
Lead Channel Pacing Threshold Pulse Width: 0.4 ms
Lead Channel Pacing Threshold Pulse Width: 0.4 ms
Lead Channel Sensing Intrinsic Amplitude: 1.75 mV
Lead Channel Sensing Intrinsic Amplitude: 1.75 mV
Lead Channel Sensing Intrinsic Amplitude: 7.375 mV
Lead Channel Sensing Intrinsic Amplitude: 7.375 mV
Lead Channel Setting Pacing Amplitude: 1.5 V
Lead Channel Setting Pacing Amplitude: 2.5 V
Lead Channel Setting Pacing Pulse Width: 0.4 ms
Lead Channel Setting Sensing Sensitivity: 1.2 mV

## 2022-04-27 ENCOUNTER — Encounter: Payer: Self-pay | Admitting: Internal Medicine

## 2022-05-02 NOTE — Progress Notes (Signed)
Remote pacemaker transmission.   

## 2022-07-11 ENCOUNTER — Other Ambulatory Visit: Payer: Self-pay | Admitting: Internal Medicine

## 2022-07-18 ENCOUNTER — Ambulatory Visit (INDEPENDENT_AMBULATORY_CARE_PROVIDER_SITE_OTHER): Payer: PPO

## 2022-07-18 DIAGNOSIS — I495 Sick sinus syndrome: Secondary | ICD-10-CM | POA: Diagnosis not present

## 2022-07-19 LAB — CUP PACEART REMOTE DEVICE CHECK
Battery Remaining Longevity: 41 mo
Battery Voltage: 2.97 V
Brady Statistic AP VP Percent: 0.21 %
Brady Statistic AP VS Percent: 93.1 %
Brady Statistic AS VP Percent: 3.59 %
Brady Statistic AS VS Percent: 3.1 %
Brady Statistic RA Percent Paced: 92.68 %
Brady Statistic RV Percent Paced: 3.74 %
Date Time Interrogation Session: 20230816160756
Implantable Lead Implant Date: 20160720
Implantable Lead Implant Date: 20160720
Implantable Lead Location: 753859
Implantable Lead Location: 753860
Implantable Lead Model: 5076
Implantable Lead Model: 5076
Implantable Pulse Generator Implant Date: 20160720
Lead Channel Impedance Value: 342 Ohm
Lead Channel Impedance Value: 437 Ohm
Lead Channel Impedance Value: 513 Ohm
Lead Channel Impedance Value: 551 Ohm
Lead Channel Pacing Threshold Amplitude: 0.75 V
Lead Channel Pacing Threshold Amplitude: 0.875 V
Lead Channel Pacing Threshold Pulse Width: 0.4 ms
Lead Channel Pacing Threshold Pulse Width: 0.4 ms
Lead Channel Sensing Intrinsic Amplitude: 1 mV
Lead Channel Sensing Intrinsic Amplitude: 1 mV
Lead Channel Sensing Intrinsic Amplitude: 7.125 mV
Lead Channel Sensing Intrinsic Amplitude: 7.125 mV
Lead Channel Setting Pacing Amplitude: 1.5 V
Lead Channel Setting Pacing Amplitude: 2.5 V
Lead Channel Setting Pacing Pulse Width: 0.4 ms
Lead Channel Setting Sensing Sensitivity: 1.2 mV

## 2022-08-07 DIAGNOSIS — D500 Iron deficiency anemia secondary to blood loss (chronic): Secondary | ICD-10-CM | POA: Diagnosis not present

## 2022-08-07 DIAGNOSIS — E782 Mixed hyperlipidemia: Secondary | ICD-10-CM | POA: Diagnosis not present

## 2022-08-14 DIAGNOSIS — E782 Mixed hyperlipidemia: Secondary | ICD-10-CM | POA: Diagnosis not present

## 2022-08-14 DIAGNOSIS — R739 Hyperglycemia, unspecified: Secondary | ICD-10-CM | POA: Diagnosis not present

## 2022-08-14 DIAGNOSIS — M19041 Primary osteoarthritis, right hand: Secondary | ICD-10-CM | POA: Diagnosis not present

## 2022-08-14 DIAGNOSIS — I480 Paroxysmal atrial fibrillation: Secondary | ICD-10-CM | POA: Diagnosis not present

## 2022-08-14 DIAGNOSIS — D500 Iron deficiency anemia secondary to blood loss (chronic): Secondary | ICD-10-CM | POA: Diagnosis not present

## 2022-08-17 NOTE — Progress Notes (Signed)
Remote pacemaker transmission.   

## 2022-09-11 ENCOUNTER — Ambulatory Visit: Payer: PPO | Attending: Internal Medicine | Admitting: Internal Medicine

## 2022-09-11 ENCOUNTER — Encounter: Payer: Self-pay | Admitting: Internal Medicine

## 2022-09-11 VITALS — BP 110/58 | HR 63 | Ht 61.0 in | Wt 162.0 lb

## 2022-09-11 DIAGNOSIS — I10 Essential (primary) hypertension: Secondary | ICD-10-CM | POA: Diagnosis not present

## 2022-09-11 DIAGNOSIS — I495 Sick sinus syndrome: Secondary | ICD-10-CM

## 2022-09-11 DIAGNOSIS — Z950 Presence of cardiac pacemaker: Secondary | ICD-10-CM

## 2022-09-11 DIAGNOSIS — I480 Paroxysmal atrial fibrillation: Secondary | ICD-10-CM | POA: Diagnosis not present

## 2022-09-11 NOTE — Patient Instructions (Addendum)
Medication Instructions:  - Your physician recommends that you continue on your current medications as directed. Please refer to the Current Medication list given to you today.   Samples Given: Eliquis 5 mg Lot: [id number] Exp: 04/2024 # 3 boxes  *If you need a refill on your cardiac medications before your next appointment, please call your pharmacy*   Lab Work: - none ordered  If you have labs (blood work) drawn today and your tests are completely normal, you will receive your results only by: [place] (if you have MyChart) OR A paper copy in the mail If you have any lab test that is abnormal or we need to change your treatment, we will call you to review the results.   Testing/Procedures: - none ordered   Follow-Up: At [facility], you and your health needs are our priority.  As part of our continuing mission to provide you with exceptional heart care, we have created designated Provider Care Teams.  These Care Teams include your primary Cardiologist (physician) and Advanced Practice Providers (APPs -  Physician Assistants and Nurse Practitioners) who all work together to provide you with the care you need, when you need it.  We recommend signing up for the patient portal called "MyChart".  Sign up information is provided on this After Visit Summary.  MyChart is used to connect with patients for Virtual Visits (Telemedicine).  Patients are able to view lab/test results, encounter notes, upcoming appointments, etc.  Non-urgent messages can be sent to your provider as well.   To learn more about what you can do with MyChart, go to [email or web address].    Your next appointment:   1 year(s)  The format for your next appointment:   In Person  Provider:   Virl Axe, MD    Other Instructions N/a  Important Information About Sugar

## 2022-09-11 NOTE — Progress Notes (Signed)
Patient Care Team: Rusty Aus, MD as PCP - General (Unknown Physician Specialty) [name], MD as PCP - Cardiology (Cardiology) [name], MD as Consulting Physician (Cardiology)   HPI  Hailey Simmons is a 86 y.o. female Seen in followup for  Medtronic pacer implanted (2016)  for sinus node dysfunction and PAF Rx with flecainide and anticoagulation with apixaban   Hospitalized 7/20 with severe hyponatremia (118); diuretics were discontinued.     Some recurrent Afib assoc with generalized malaise.  Had discussions regarding catheter ablation, was scheduled with Dr Greggory Brandy, but she chose to defer because of concerns regarding GI polyps for which she underwent colonoscopy 11/22>>benign polyps, diverticulosis.    The patient denies chest pain, shortness of breat, nocturnal dyspnea, orthopnea or peripheral edema.  There have been no palpitations, lightheadedness or syncope  No bleeding on anticoagulation.     DATE TEST EF   2/13    Echo  60 %   8/17    Myoview     55-60 % No Ischemia        Date Cr NA K TSH Hgb  2/18 0.9    12.6  8/18  0.75    12.5  2/19 0.8    12.8  7/20 .57 118   11.2>>11.9  10/20  137     2/21 0.7 137  1.05 13.5  8/21 0.8 136 4.1 6.29 12.4  3/22 0.8 138 4.7 0.9 12.9  8/22 0.9 142 4.3 0.497 (10/22) 11.9  1/23     11.5  9/23 0.9 138 4.0 0.193 12.0     DATE PR duration QRSduration Dose-flecianide  3/13 160 134 0  7/15  158 50  10/16 180 154 100  6/17`  146 100  7/18 (A paced) 280 140 100  8/19 Apaced 240 142 100   9/20 Apaced 236 154 100  4/21 Apaced 260 150 100  10/21 Apace 280 170 100>>75   11/21 Apace 304 170 75>>50  3/22 Apace 275 144 50  1/23 Apace 240 154 50  10/23 Apace 250 156 50   Thromboembolic risk factors ( age -24, HTN-1, Gender-1) for a CHADSVASc Score of 4   Past Medical History:  Diagnosis Date   Asthma 1970's - 1990's   a. ? 2/2 wood stove usage.   Basal cell carcinoma of skin of nose     Diverticulosis    Goiter    History of chicken pox    Hx of colonic polyps    Hypertension    Hypothyroidism    Menopause    PAF (paroxysmal atrial fibrillation) ([place])    on apixoban   Sinus node dysfunction ([place])    a. s/p MDT dual chamber pacemaker   Small bowel obstruction ([place]) 08/2009    Past Surgical History:  Procedure Laterality Date   CATARACT EXTRACTION  02/2019   CATARACT EXTRACTION  04/2019   COLONOSCOPY     COLONOSCOPY WITH PROPOFOL N/A 12/02/2018   Procedure: COLONOSCOPY WITH PROPOFOL;  Surgeon: Lollie Sails, MD;  Location: [facility] ENDOSCOPY;  Service: Endoscopy;  Laterality: N/A;   EP IMPLANTABLE DEVICE N/A 06/22/2015   MDT MRI compatible dual chamber PPM implanted by Dr Caryl Comes for symptomatic bradycardia   INSERT / REPLACE / REMOVE PACEMAKER      Current Outpatient Medications  Medication Sig Dispense Refill   apixaban (ELIQUIS) 5 MG TABS tablet Take 1 tablet (5 mg total) by mouth 2 (two)  times daily. 180 tablet 3   atorvastatin (LIPITOR) 10 MG tablet TAKE ONE (1) TABLET EACH DAY 90 tablet 3   calcium carbonate (OS-CAL) 600 MG TABS Take 600 mg by mouth 2 (two) times daily with a meal.     Cholecalciferol (VITAMIN D3) 2000 UNITS TABS Take 1 tablet by mouth daily.     clobetasol ointment (TEMOVATE) 0.05 % Apply topically as needed.     Cranberry 1000 MG CAPS Take 1 capsule by mouth daily.      diltiazem (CARDIZEM CD) 240 MG 24 hr capsule Take 240 mg by mouth at bedtime.     ESTRACE VAGINAL 0.1 MG/GM vaginal cream Place 1 Applicatorful vaginally as needed.      flecainide (TAMBOCOR) 50 MG tablet Take 1 tablet (50 mg total) by mouth 2 (two) times daily. 180 tablet 2   furosemide (LASIX) 20 MG tablet TAKE ONE TABLET EVERY DAY AS NEEDED 30 tablet 2   hydrALAZINE (APRESOLINE) 25 MG tablet Take 25 mg by mouth 2 (two) times daily.     levothyroxine (SYNTHROID, LEVOTHROID) 100 MCG tablet Take 1 tablet (100 mcg) by mouth 6 days a week     metoprolol succinate (TOPROL-XL)  25 MG 24 hr tablet Take 1 tablet (25 mg total) by mouth daily. Please keep upcoming appointment for future refills. Thank you. 90 tablet 0   nitrofurantoin, macrocrystal-monohydrate, (MACROBID) 100 MG capsule Take 100 mg by mouth daily as needed.     olmesartan (BENICAR) 40 MG tablet Take 40 mg by mouth daily.     omeprazole (PRILOSEC) 40 MG capsule Take 40 mg by mouth daily as needed.     polyethylene glycol (MIRALAX / GLYCOLAX) 17 g packet Take 17 g by mouth daily.     sodium chloride 1 g tablet Take by mouth. Rarely taken per pt     traZODone (DESYREL) 50 MG tablet Take 50 mg by mouth at bedtime as needed.     No current facility-administered medications for this visit.    Allergies  Allergen Reactions   Prednisone Other (See Comments)      Review of Systems negative except from HPI and PMH  Physical Exam  BP (!) 110/58   Pulse 63   Ht '5\' 1"'$  (1.549 m)   Wt 162 lb (73.5 kg)   SpO2 96%   BMI 30.61 kg/m  Well developed and well nourished in no acute distress HENT normal Neck supple with JVP-flat Clear Device pocket well healed; without hematoma or erythema.  There is no tethering  Regular rate and rhythm, no  gallop No  murmur Abd-soft with active BS No Clubbing cyanosis  edema Skin-warm and dry A & Oriented  Grossly normal sensory and motor function  ECG Apacing at 64 25/26/42      Assessment and  Plan  Atrial fibrillation-persisting but mostly paroxysmal  Sinus node dysfunction  Pacemaker-Medtronic       Hyponatremia  Right bundle branch block/first-degree AV block  Hypertension   Afib burden decreasing gradually, continue flec at 50 bid, QRSd stable  Continue Apixaban  at 5 mg bid (wt/Cr)  BP well controlled, exceptionally, and she may tolerate down titration of some meds, but without LH hold for now  Hyperthyroid, and PCXP reduced replacement dose from 7d>> 6d /week .

## 2022-09-20 ENCOUNTER — Other Ambulatory Visit: Payer: Self-pay | Admitting: Internal Medicine

## 2022-09-20 DIAGNOSIS — Z1231 Encounter for screening mammogram for malignant neoplasm of breast: Secondary | ICD-10-CM

## 2022-09-21 DIAGNOSIS — M65341 Trigger finger, right ring finger: Secondary | ICD-10-CM | POA: Diagnosis not present

## 2022-09-21 DIAGNOSIS — M7989 Other specified soft tissue disorders: Secondary | ICD-10-CM | POA: Diagnosis not present

## 2022-10-05 ENCOUNTER — Telehealth: Payer: Self-pay | Admitting: Internal Medicine

## 2022-10-05 NOTE — Telephone Encounter (Signed)
Noted  

## 2022-10-05 NOTE — Telephone Encounter (Signed)
Rheumatology called wanting a copy of patient's most recent EKG she had done on 09/11/22 faxed over to them at [phone].  They have Care Everywhere but they aren't able to see the EKG.

## 2022-10-10 ENCOUNTER — Other Ambulatory Visit: Payer: Self-pay | Admitting: Internal Medicine

## 2022-10-15 DIAGNOSIS — M154 Erosive (osteo)arthritis: Secondary | ICD-10-CM | POA: Diagnosis not present

## 2022-10-15 DIAGNOSIS — M7989 Other specified soft tissue disorders: Secondary | ICD-10-CM | POA: Diagnosis not present

## 2022-10-15 DIAGNOSIS — M65349 Trigger finger, unspecified ring finger: Secondary | ICD-10-CM | POA: Diagnosis not present

## 2022-10-17 ENCOUNTER — Ambulatory Visit (INDEPENDENT_AMBULATORY_CARE_PROVIDER_SITE_OTHER): Payer: PPO

## 2022-10-17 DIAGNOSIS — I495 Sick sinus syndrome: Secondary | ICD-10-CM

## 2022-10-18 ENCOUNTER — Ambulatory Visit
Admission: RE | Admit: 2022-10-18 | Discharge: 2022-10-18 | Disposition: A | Discharge status: Home / Self Care | Payer: PPO | Source: Ambulatory Visit | Attending: Internal Medicine | Admitting: Internal Medicine

## 2022-10-18 DIAGNOSIS — Z1231 Encounter for screening mammogram for malignant neoplasm of breast: Secondary | ICD-10-CM | POA: Insufficient documentation

## 2022-10-18 LAB — CUP PACEART REMOTE DEVICE CHECK
Battery Remaining Longevity: 37 mo
Battery Voltage: 2.96 V
Brady Statistic AP VP Percent: 0.23 %
Brady Statistic AP VS Percent: 93.13 %
Brady Statistic AS VP Percent: 2.84 %
Brady Statistic AS VS Percent: 3.8 %
Brady Statistic RA Percent Paced: 92.61 %
Brady Statistic RV Percent Paced: 3.02 %
Date Time Interrogation Session: 20231116132210
Implantable Lead Connection Status: 753985
Implantable Lead Connection Status: 753985
Implantable Lead Implant Date: 20160720
Implantable Lead Implant Date: 20160720
Implantable Lead Location: 753859
Implantable Lead Location: 753860
Implantable Lead Model: 5076
Implantable Lead Model: 5076
Implantable Pulse Generator Implant Date: 20160720
Lead Channel Impedance Value: 323 Ohm
Lead Channel Impedance Value: 437 Ohm
Lead Channel Impedance Value: 532 Ohm
Lead Channel Impedance Value: 551 Ohm
Lead Channel Pacing Threshold Amplitude: 0.75 V
Lead Channel Pacing Threshold Amplitude: 0.75 V
Lead Channel Pacing Threshold Pulse Width: 0.4 ms
Lead Channel Pacing Threshold Pulse Width: 0.4 ms
Lead Channel Sensing Intrinsic Amplitude: 2 mV
Lead Channel Sensing Intrinsic Amplitude: 2 mV
Lead Channel Sensing Intrinsic Amplitude: 8.25 mV
Lead Channel Sensing Intrinsic Amplitude: 8.25 mV
Lead Channel Setting Pacing Amplitude: 1.5 V
Lead Channel Setting Pacing Amplitude: 2 V
Lead Channel Setting Pacing Pulse Width: 0.4 ms
Lead Channel Setting Sensing Sensitivity: 1.2 mV
Zone Setting Status: 755011
Zone Setting Status: 755011

## 2022-10-24 ENCOUNTER — Other Ambulatory Visit: Payer: Self-pay | Admitting: Internal Medicine

## 2022-10-24 DIAGNOSIS — R928 Other abnormal and inconclusive findings on diagnostic imaging of breast: Secondary | ICD-10-CM

## 2022-10-24 DIAGNOSIS — R921 Mammographic calcification found on diagnostic imaging of breast: Secondary | ICD-10-CM

## 2022-11-08 ENCOUNTER — Ambulatory Visit
Admission: RE | Admit: 2022-11-08 | Discharge: 2022-11-08 | Disposition: A | Discharge status: Home / Self Care | Payer: PPO | Source: Ambulatory Visit | Attending: Internal Medicine | Admitting: Internal Medicine

## 2022-11-08 DIAGNOSIS — R928 Other abnormal and inconclusive findings on diagnostic imaging of breast: Secondary | ICD-10-CM | POA: Insufficient documentation

## 2022-11-08 DIAGNOSIS — R921 Mammographic calcification found on diagnostic imaging of breast: Secondary | ICD-10-CM | POA: Diagnosis not present

## 2022-11-09 NOTE — Progress Notes (Signed)
Remote pacemaker transmission.   

## 2023-01-04 DIAGNOSIS — D2262 Melanocytic nevi of left upper limb, including shoulder: Secondary | ICD-10-CM | POA: Diagnosis not present

## 2023-01-04 DIAGNOSIS — D2261 Melanocytic nevi of right upper limb, including shoulder: Secondary | ICD-10-CM | POA: Diagnosis not present

## 2023-01-04 DIAGNOSIS — D2271 Melanocytic nevi of right lower limb, including hip: Secondary | ICD-10-CM | POA: Diagnosis not present

## 2023-01-04 DIAGNOSIS — L309 Dermatitis, unspecified: Secondary | ICD-10-CM | POA: Diagnosis not present

## 2023-01-04 DIAGNOSIS — L821 Other seborrheic keratosis: Secondary | ICD-10-CM | POA: Diagnosis not present

## 2023-01-04 DIAGNOSIS — D2272 Melanocytic nevi of left lower limb, including hip: Secondary | ICD-10-CM | POA: Diagnosis not present

## 2023-01-04 DIAGNOSIS — L900 Lichen sclerosus et atrophicus: Secondary | ICD-10-CM | POA: Diagnosis not present

## 2023-01-04 DIAGNOSIS — D225 Melanocytic nevi of trunk: Secondary | ICD-10-CM | POA: Diagnosis not present

## 2023-01-04 DIAGNOSIS — L940 Localized scleroderma [morphea]: Secondary | ICD-10-CM | POA: Diagnosis not present

## 2023-01-11 DIAGNOSIS — L940 Localized scleroderma [morphea]: Secondary | ICD-10-CM | POA: Diagnosis not present

## 2023-01-16 ENCOUNTER — Ambulatory Visit: Payer: PPO

## 2023-01-16 DIAGNOSIS — I495 Sick sinus syndrome: Secondary | ICD-10-CM | POA: Diagnosis not present

## 2023-01-18 LAB — CUP PACEART REMOTE DEVICE CHECK
Battery Remaining Longevity: 39 mo
Battery Voltage: 2.96 V
Brady Statistic AP VP Percent: 0.15 %
Brady Statistic AP VS Percent: 86.63 %
Brady Statistic AS VP Percent: 3.51 %
Brady Statistic AS VS Percent: 9.71 %
Brady Statistic RA Percent Paced: 85.84 %
Brady Statistic RV Percent Paced: 3.63 %
Date Time Interrogation Session: 20240216133320
Implantable Lead Connection Status: 753985
Implantable Lead Connection Status: 753985
Implantable Lead Implant Date: 20160720
Implantable Lead Implant Date: 20160720
Implantable Lead Location: 753859
Implantable Lead Location: 753860
Implantable Lead Model: 5076
Implantable Lead Model: 5076
Implantable Pulse Generator Implant Date: 20160720
Lead Channel Impedance Value: 323 Ohm
Lead Channel Impedance Value: 437 Ohm
Lead Channel Impedance Value: 494 Ohm
Lead Channel Impedance Value: 532 Ohm
Lead Channel Pacing Threshold Amplitude: 0.75 V
Lead Channel Pacing Threshold Amplitude: 0.75 V
Lead Channel Pacing Threshold Pulse Width: 0.4 ms
Lead Channel Pacing Threshold Pulse Width: 0.4 ms
Lead Channel Sensing Intrinsic Amplitude: 1.5 mV
Lead Channel Sensing Intrinsic Amplitude: 1.5 mV
Lead Channel Sensing Intrinsic Amplitude: 7.875 mV
Lead Channel Sensing Intrinsic Amplitude: 7.875 mV
Lead Channel Setting Pacing Amplitude: 1.5 V
Lead Channel Setting Pacing Amplitude: 2 V
Lead Channel Setting Pacing Pulse Width: 0.4 ms
Lead Channel Setting Sensing Sensitivity: 1.2 mV
Zone Setting Status: 755011
Zone Setting Status: 755011

## 2023-02-15 NOTE — Progress Notes (Signed)
Remote pacemaker transmission.   

## 2023-03-08 DIAGNOSIS — Z79899 Other long term (current) drug therapy: Secondary | ICD-10-CM | POA: Diagnosis not present

## 2023-03-08 DIAGNOSIS — L940 Localized scleroderma [morphea]: Secondary | ICD-10-CM | POA: Diagnosis not present

## 2023-03-13 DIAGNOSIS — E782 Mixed hyperlipidemia: Secondary | ICD-10-CM | POA: Diagnosis not present

## 2023-03-13 DIAGNOSIS — D500 Iron deficiency anemia secondary to blood loss (chronic): Secondary | ICD-10-CM | POA: Diagnosis not present

## 2023-03-13 DIAGNOSIS — R739 Hyperglycemia, unspecified: Secondary | ICD-10-CM | POA: Diagnosis not present

## 2023-03-20 DIAGNOSIS — I480 Paroxysmal atrial fibrillation: Secondary | ICD-10-CM | POA: Diagnosis not present

## 2023-03-20 DIAGNOSIS — Z79899 Other long term (current) drug therapy: Secondary | ICD-10-CM | POA: Diagnosis not present

## 2023-03-20 DIAGNOSIS — Z0000 Encounter for general adult medical examination without abnormal findings: Secondary | ICD-10-CM | POA: Diagnosis not present

## 2023-03-20 DIAGNOSIS — D500 Iron deficiency anemia secondary to blood loss (chronic): Secondary | ICD-10-CM | POA: Diagnosis not present

## 2023-03-29 DIAGNOSIS — L940 Localized scleroderma [morphea]: Secondary | ICD-10-CM | POA: Diagnosis not present

## 2023-04-09 DIAGNOSIS — J40 Bronchitis, not specified as acute or chronic: Secondary | ICD-10-CM | POA: Diagnosis not present

## 2023-04-09 DIAGNOSIS — J45902 Unspecified asthma with status asthmaticus: Secondary | ICD-10-CM | POA: Diagnosis not present

## 2023-04-09 DIAGNOSIS — I480 Paroxysmal atrial fibrillation: Secondary | ICD-10-CM | POA: Diagnosis not present

## 2023-04-17 ENCOUNTER — Ambulatory Visit: Payer: PPO

## 2023-04-25 ENCOUNTER — Encounter: Payer: Self-pay | Admitting: Internal Medicine

## 2023-04-26 ENCOUNTER — Other Ambulatory Visit: Payer: Self-pay | Admitting: Internal Medicine

## 2023-04-26 DIAGNOSIS — R921 Mammographic calcification found on diagnostic imaging of breast: Secondary | ICD-10-CM

## 2023-05-02 ENCOUNTER — Other Ambulatory Visit: Payer: Self-pay | Admitting: Cardiovascular Disease

## 2023-05-02 DIAGNOSIS — I480 Paroxysmal atrial fibrillation: Secondary | ICD-10-CM

## 2023-05-02 NOTE — Telephone Encounter (Signed)
Refill request

## 2023-05-02 NOTE — Telephone Encounter (Signed)
Prescription refill request for Eliquis received. Indication: Afib  Last office visit: 09/11/22 Graciela Husbands)  Scr: 1.0 (03/13/23)  Age: 87 Weight: 73.5kg  Appropriate dose. Refill sent.

## 2023-05-09 ENCOUNTER — Ambulatory Visit (INDEPENDENT_AMBULATORY_CARE_PROVIDER_SITE_OTHER): Payer: PPO

## 2023-05-09 DIAGNOSIS — I495 Sick sinus syndrome: Secondary | ICD-10-CM | POA: Diagnosis not present

## 2023-05-10 LAB — CUP PACEART REMOTE DEVICE CHECK
Battery Remaining Longevity: 32 mo
Battery Voltage: 2.95 V
Brady Statistic AP VP Percent: 0.18 %
Brady Statistic AP VS Percent: 93.45 %
Brady Statistic AS VP Percent: 2.64 %
Brady Statistic AS VS Percent: 3.72 %
Brady Statistic RA Percent Paced: 92.96 %
Brady Statistic RV Percent Paced: 2.8 %
Date Time Interrogation Session: 20240606161427
Implantable Lead Connection Status: 753985
Implantable Lead Connection Status: 753985
Implantable Lead Implant Date: 20160720
Implantable Lead Implant Date: 20160720
Implantable Lead Location: 753859
Implantable Lead Location: 753860
Implantable Lead Model: 5076
Implantable Lead Model: 5076
Implantable Pulse Generator Implant Date: 20160720
Lead Channel Impedance Value: 361 Ohm
Lead Channel Impedance Value: 475 Ohm
Lead Channel Impedance Value: 551 Ohm
Lead Channel Impedance Value: 551 Ohm
Lead Channel Pacing Threshold Amplitude: 0.75 V
Lead Channel Pacing Threshold Amplitude: 0.75 V
Lead Channel Pacing Threshold Pulse Width: 0.4 ms
Lead Channel Pacing Threshold Pulse Width: 0.4 ms
Lead Channel Sensing Intrinsic Amplitude: 1.375 mV
Lead Channel Sensing Intrinsic Amplitude: 1.375 mV
Lead Channel Sensing Intrinsic Amplitude: 6.75 mV
Lead Channel Sensing Intrinsic Amplitude: 6.75 mV
Lead Channel Setting Pacing Amplitude: 2 V
Lead Channel Setting Pacing Amplitude: 2 V
Lead Channel Setting Pacing Pulse Width: 0.4 ms
Lead Channel Setting Sensing Sensitivity: 1.2 mV
Zone Setting Status: 755011
Zone Setting Status: 755011

## 2023-05-13 ENCOUNTER — Ambulatory Visit
Admission: RE | Admit: 2023-05-13 | Discharge: 2023-05-13 | Disposition: A | Discharge status: Home / Self Care | Payer: PPO | Source: Ambulatory Visit | Attending: Internal Medicine | Admitting: Internal Medicine

## 2023-05-13 DIAGNOSIS — R921 Mammographic calcification found on diagnostic imaging of breast: Secondary | ICD-10-CM

## 2023-05-15 ENCOUNTER — Other Ambulatory Visit: Payer: Self-pay | Admitting: Internal Medicine

## 2023-05-15 DIAGNOSIS — R921 Mammographic calcification found on diagnostic imaging of breast: Secondary | ICD-10-CM

## 2023-05-31 NOTE — Progress Notes (Signed)
Remote pacemaker transmission.   

## 2023-06-21 DIAGNOSIS — L940 Localized scleroderma [morphea]: Secondary | ICD-10-CM | POA: Diagnosis not present

## 2023-07-17 ENCOUNTER — Ambulatory Visit: Payer: PPO

## 2023-07-26 DIAGNOSIS — L940 Localized scleroderma [morphea]: Secondary | ICD-10-CM | POA: Diagnosis not present

## 2023-08-08 ENCOUNTER — Ambulatory Visit: Payer: PPO

## 2023-09-12 DIAGNOSIS — Z79899 Other long term (current) drug therapy: Secondary | ICD-10-CM | POA: Diagnosis not present

## 2023-09-12 DIAGNOSIS — D500 Iron deficiency anemia secondary to blood loss (chronic): Secondary | ICD-10-CM | POA: Diagnosis not present

## 2023-09-19 DIAGNOSIS — E782 Mixed hyperlipidemia: Secondary | ICD-10-CM | POA: Diagnosis not present

## 2023-09-19 DIAGNOSIS — I480 Paroxysmal atrial fibrillation: Secondary | ICD-10-CM | POA: Diagnosis not present

## 2023-09-19 DIAGNOSIS — R739 Hyperglycemia, unspecified: Secondary | ICD-10-CM | POA: Diagnosis not present

## 2023-09-19 DIAGNOSIS — Z23 Encounter for immunization: Secondary | ICD-10-CM | POA: Diagnosis not present

## 2023-09-25 ENCOUNTER — Ambulatory Visit: Payer: PPO | Attending: Internal Medicine | Admitting: Internal Medicine

## 2023-09-25 ENCOUNTER — Encounter: Payer: Self-pay | Admitting: Internal Medicine

## 2023-09-25 VITALS — BP 152/62 | HR 63 | Ht 63.0 in | Wt 170.8 lb

## 2023-09-25 DIAGNOSIS — I480 Paroxysmal atrial fibrillation: Secondary | ICD-10-CM | POA: Diagnosis not present

## 2023-09-25 NOTE — Progress Notes (Signed)
Patient Care Team: Danella Penton, MD as PCP - General (Unknown Physician Specialty) Antonieta Iba, MD as PCP - Cardiology (Cardiology) Antonieta Iba, MD as Consulting Physician (Cardiology)   HPI  Hailey Simmons is a 87 y.o. female Seen in followup for  Medtronic pacer implanted (2016)  for sinus node dysfunction and PAF Rx with flecainide and anticoagulation with apixaban   Hospitalized 7/20 with severe hyponatremia (118); diuretics were discontinued.     Some recurrent Afib assoc with generalized malaise.  Had discussions regarding catheter ablation, was scheduled with Dr Fawn Kirk, but she chose to defer because of concerns regarding GI polyps for which she underwent colonoscopy 11/22>>benign polyps, diverticulosis.    The patient denies chest pain, shortness of breath, nocturnal dyspnea, orthopnea or peripheral edema.  There have been no palpitations, lightheadedness or syncope.   No bleeding on Apixaban      DATE TEST EF   2/13    Echo  60 %   8/17    Myoview     55-60 % No Ischemia  10/22 Echo  70-75%    Date Cr NA K TSH Hgb  2/18 0.9    12.6  8/18  0.75    12.5  2/19 0.8    12.8  7/20 .57 118   11.2>>11.9  10/20  137     2/21 0.7 137  1.05 13.5  8/21 0.8 136 4.1 6.29 12.4  3/22 0.8 138 4.7 0.9 12.9  8/22 0.9 142 4.3 0.497 (10/22) 11.9  1/23     11.5  9/23 0.9 138 4.0 0.193 12.0  10/24 0.9 139 4.2 0.9 11.7     DATE PR duration QRSduration Dose-flecianide  3/13 160 134 0  7/15  158 50  10/16 180 154 100  6/17`  146 100  7/18 (A paced) 280 140 100  8/19 Apaced 240 142 100   9/20 Apaced 236 154 100  4/21 Apaced 260 150 100  10/21 Apace 280 170 100>>75   11/21 Apace 304 170 75>>50  3/22 Apace 275 144 50  1/23 Apace 240 154 50  10/23 Apace 250 156 50  10/24 Apacd240 152 50   Thromboembolic risk factors ( age -80, HTN-1, Gender-1) for a CHADSVASc Score of 4   Past Medical History:  Diagnosis Date   Asthma 1970's - 1990's   a. ? 2/2 wood  stove usage.   Basal cell carcinoma of skin of nose    Diverticulosis    Goiter    History of chicken pox    Hx of colonic polyps    Hypertension    Hypothyroidism    Menopause    PAF (paroxysmal atrial fibrillation) ([facility])    on apixoban   Sinus node dysfunction ([facility])    a. s/p MDT dual chamber pacemaker   Small bowel obstruction ([facility]) 08/2009    Past Surgical History:  Procedure Laterality Date   CATARACT EXTRACTION  02/2019   CATARACT EXTRACTION  04/2019   COLONOSCOPY     COLONOSCOPY WITH PROPOFOL N/A 12/02/2018   Procedure: COLONOSCOPY WITH PROPOFOL;  Surgeon: Christena Deem, MD;  Location: [facility] ENDOSCOPY;  Service: Endoscopy;  Laterality: N/A;   EP IMPLANTABLE DEVICE N/A 06/22/2015   MDT MRI compatible dual chamber PPM implanted by Dr Graciela Husbands for symptomatic bradycardia   INSERT / REPLACE / REMOVE PACEMAKER      Current Outpatient Medications  Medication Sig Dispense Refill   atorvastatin (  LIPITOR) 10 MG tablet TAKE ONE (1) TABLET EACH DAY 90 tablet 3   calcium carbonate (OS-CAL) 600 MG TABS Take 600 mg by mouth 2 (two) times daily with a meal.     Cholecalciferol (VITAMIN D3) 2000 UNITS TABS Take 1 tablet by mouth daily.     clobetasol ointment (TEMOVATE) 0.05 % Apply topically as needed.     Cranberry 1000 MG CAPS Take 1 capsule by mouth daily.      diltiazem (CARDIZEM CD) 240 MG 24 hr capsule Take 240 mg by mouth at bedtime.     ELIQUIS 5 MG TABS tablet TAKE ONE TABLET BY MOUTH TWICE DAILY 180 tablet 3   ESTRACE VAGINAL 0.1 MG/GM vaginal cream Place 1 Applicatorful vaginally as needed.      flecainide (TAMBOCOR) 50 MG tablet Take 1 tablet (50 mg total) by mouth 2 (two) times daily. 180 tablet 2   folic acid (FOLVITE) 1 MG tablet Take 1 mg by mouth daily.     furosemide (LASIX) 20 MG tablet TAKE ONE TABLET EVERY DAY AS NEEDED 30 tablet 2   hydrALAZINE (APRESOLINE) 25 MG tablet Take 25 mg by mouth 2 (two) times daily.     levothyroxine (SYNTHROID, LEVOTHROID) 100 MCG  tablet Take 1 tablet (100 mcg) by mouth 6 days a week     methotrexate (RHEUMATREX) 2.5 MG tablet Take 12.5 mg by mouth once a week.     metoprolol succinate (TOPROL-XL) 25 MG 24 hr tablet TAKE ONE TABLET EVERY DAY 90 tablet 3   nitrofurantoin, macrocrystal-monohydrate, (MACROBID) 100 MG capsule Take 100 mg by mouth daily as needed.     olmesartan (BENICAR) 40 MG tablet Take 40 mg by mouth daily.     omeprazole (PRILOSEC) 40 MG capsule Take 40 mg by mouth daily as needed.     polyethylene glycol (MIRALAX / GLYCOLAX) 17 g packet Take 17 g by mouth daily.     sodium chloride 1 g tablet Take by mouth. Rarely taken per pt     traZODone (DESYREL) 50 MG tablet Take 50 mg by mouth at bedtime as needed.     No current facility-administered medications for this visit.    Allergies  Allergen Reactions   Prednisone Other (See Comments)      Review of Systems negative except from HPI and PMH  Physical Exam  BP (!) 152/62 (BP Location: Left Arm, Patient Position: Sitting, Cuff Size: Large)   Pulse 63   Ht 5\' 3"  (1.6 m)   Wt 170 lb 12.8 oz (77.5 kg)   SpO2 98%   BMI 30.26 kg/m  Well developed and well nourished in no acute distress HENT normal Neck supple with JVP-flat Clear Device pocket well healed; without hematoma or erythema.  There is no tethering  Regular rate and rhythm, no  gallop No  murmur Abd-soft with active BS No Clubbing cyanosis  edema Skin-warm and dry A & Oriented  Grossly normal sensory and motor function  ECG atrial pacing with interval as noted above  Device function is normal. Programming changes none See Paceart for details    Assessment and  Plan  Atrial fibrillation-persisting but mostly paroxysmal  Sinus node dysfunction  Pacemaker-Medtronic       Hyponatremia  Right bundle branch block/first-degree AV block  Hypertension    Paroxysms of atrial fibrillation with a gradually diminishing burden.  This is coincidental with the use of  methotrexate.  Did at least 1 article I found showed a greater than 10%  risk reduction in atrial fibrillation burden with use of methotrexate.  Continue the flecainide  No bleeding.  Continue apixaban dosed appropriately for age and renal function  Blood pressure was elevated and not appreciated prior to her leaving.  Continue her on Cardizem metoprolol and olmesartan

## 2023-09-25 NOTE — Patient Instructions (Signed)
Medication Instructions:  The current medical regimen is effective;  continue present plan and medications.  *If you need a refill on your cardiac medications before your next appointment, please call your pharmacy*   Follow-Up: At [facility], you and your health needs are our priority.  As part of our continuing mission to provide you with exceptional heart care, we have created designated Provider Care Teams.  These Care Teams include your primary Cardiologist (physician) and Advanced Practice Providers (APPs -  Physician Assistants and Nurse Practitioners) who all work together to provide you with the care you need, when you need it.  We recommend signing up for the patient portal called "MyChart".  Sign up information is provided on this After Visit Summary.  MyChart is used to connect with patients for Virtual Visits (Telemedicine).  Patients are able to view lab/test results, encounter notes, upcoming appointments, etc.  Non-urgent messages can be sent to your provider as well.   To learn more about what you can do with MyChart, go to [email or web address].    Your next appointment:   12 month(s)  Provider:   Sherryl Manges, MD

## 2023-10-16 ENCOUNTER — Ambulatory Visit: Payer: PPO

## 2023-10-23 DIAGNOSIS — L821 Other seborrheic keratosis: Secondary | ICD-10-CM | POA: Diagnosis not present

## 2023-10-23 DIAGNOSIS — Z85828 Personal history of other malignant neoplasm of skin: Secondary | ICD-10-CM | POA: Diagnosis not present

## 2023-10-23 DIAGNOSIS — L940 Localized scleroderma [morphea]: Secondary | ICD-10-CM | POA: Diagnosis not present

## 2023-10-23 DIAGNOSIS — Z08 Encounter for follow-up examination after completed treatment for malignant neoplasm: Secondary | ICD-10-CM | POA: Diagnosis not present

## 2023-11-05 ENCOUNTER — Ambulatory Visit: Payer: PPO | Attending: Cardiovascular Disease | Admitting: Cardiovascular Disease

## 2023-11-05 ENCOUNTER — Encounter: Payer: Self-pay | Admitting: Cardiovascular Disease

## 2023-11-05 VITALS — BP 150/60 | HR 70 | Ht 63.0 in | Wt 173.5 lb

## 2023-11-05 DIAGNOSIS — I10 Essential (primary) hypertension: Secondary | ICD-10-CM

## 2023-11-05 DIAGNOSIS — I480 Paroxysmal atrial fibrillation: Secondary | ICD-10-CM

## 2023-11-05 DIAGNOSIS — Z950 Presence of cardiac pacemaker: Secondary | ICD-10-CM | POA: Diagnosis not present

## 2023-11-05 DIAGNOSIS — I495 Sick sinus syndrome: Secondary | ICD-10-CM

## 2023-11-05 MED ORDER — APIXABAN 5 MG PO TABS
5.0000 mg | ORAL_TABLET | Freq: Two times a day (BID) | ORAL | 0 refills | Status: AC
Start: 2023-11-05 — End: ?

## 2023-11-05 MED ORDER — FUROSEMIDE 20 MG PO TABS: 20.0000 mg | ORAL_TABLET | Freq: Every day | ORAL | 2 refills | Status: AC | PRN

## 2023-11-05 NOTE — Patient Instructions (Addendum)
Please monitor blood pressure at home  Compression hose and leg elevation.   Please call for worsening leg swelling.   Medication Instructions:  Continue lasix 20 mg 2-3 x per week maximum   If you need a refill on your cardiac medications before your next appointment, please call your pharmacy.   Lab work: No new labs needed  Testing/Procedures: No new testing needed  Follow-Up: At [facility], you and your health needs are our priority.  As part of our continuing mission to provide you with exceptional heart care, we have created designated Provider Care Teams.  These Care Teams include your primary Cardiologist (physician) and Advanced Practice Providers (APPs -  Physician Assistants and Nurse Practitioners) who all work together to provide you with the care you need, when you need it.  You will need a follow up appointment in 12 months  Providers on your designated Care Team:   Nicolasa Ducking, NP Eula Listen, PA-C Cadence Fransico Michael, [place]  COVID-19 Vaccine Information can be found at: [email or web address] For questions related to vaccine distribution or appointments, please email [email or web address] or call [phone].

## 2023-11-05 NOTE — Progress Notes (Signed)
InPatient ID: Frankey Shown, female   DOB: 06-06-1936, 87 y.o.   MRN: [id number] Cardiology Office Note  Date:  11/05/2023   ID:  YORDANOS BROCKIE, DOB 1936/01/28, MRN [id number]  PCP:  Danella Penton, MD   Chief Complaint  Patient presents with   12 month follow up     Patient c/o bilateral LE edema with more on the left. Medications reviewed by the patient verbally.     HPI:  Ms. Kauk is a very pleasant 87 year old woman with history of hypothyroidism,  hyperlipidemia ,  paroxysmal atrial fibrillation January 2013  Pacer for sinus node dysfunction Echocardiogram showed normal ejection fraction Tolerating flecainide  CHA2DS2-VASc Score 4 Hyponatremia, stable She presents today for follow-up of her atrial fibrillation  Last seen in clinic by myself May 2023 Seen by EP October 2024 Decreased A-fib burden, on flecainide, methotrexate  Blood pressure controlled on Cardizem metoprolol olmesartan Left leg swelling, right ok Swelling goes away in the morning, comes back by the end of the day Does not wear compression hose, sparingly taking Lasix  Pacer download Burden 4.9%, no symptoms Reports she is asymptomatic from atrial fibrillation, does not appreciate palpitations  Otherwise active at baseline but no regular exercise program  EKG personally reviewed by myself on todays visit EKG Interpretation Date/Time:  Tuesday November 05 2023 15:22:20 EST Ventricular Rate:  70 PR Interval:  234 QRS Duration:  152 QT Interval:  442 QTC Calculation: 477 R Axis:   42  Text Interpretation: Atrial-paced rhythm with prolonged AV conduction Right bundle branch block When compared with ECG of 25-Sep-2023 14:34, No significant change was found Confirmed by Julien Nordmann [phone]) on 11/05/2023 3:41:54 PM    February 2022, had COVID, treated with prednisone   hospital 06/2019 Low sodium, 118 Since then has held her diuretics Primary care has kept her on her salt supplement to maintain  sodium level   periodic urinary tract infections   She is typically very active, takes care of 3 grandchildren several days per week.    PMH:   has a past medical history of Asthma (1970's - 1990's), Basal cell carcinoma of skin of nose, Diverticulosis, Goiter, History of chicken pox, colonic polyps, Hypertension, Hypothyroidism, Menopause, PAF (paroxysmal atrial fibrillation) ([facility]), Sinus node dysfunction ([facility]), and Small bowel obstruction ([facility]) (08/2009).  PSH:    Past Surgical History:  Procedure Laterality Date   CATARACT EXTRACTION  02/2019   CATARACT EXTRACTION  04/2019   COLONOSCOPY     COLONOSCOPY WITH PROPOFOL N/A 12/02/2018   Procedure: COLONOSCOPY WITH PROPOFOL;  Surgeon: Christena Deem, MD;  Location: [facility] ENDOSCOPY;  Service: Endoscopy;  Laterality: N/A;   EP IMPLANTABLE DEVICE N/A 06/22/2015   MDT MRI compatible dual chamber PPM implanted by Dr Graciela Husbands for symptomatic bradycardia   INSERT / REPLACE / REMOVE PACEMAKER      Current Outpatient Medications  Medication Sig Dispense Refill   atorvastatin (LIPITOR) 10 MG tablet TAKE ONE (1) TABLET EACH DAY 90 tablet 3   calcium carbonate (OS-CAL) 600 MG TABS Take 600 mg by mouth 2 (two) times daily with a meal.     Cholecalciferol (VITAMIN D3) 2000 UNITS TABS Take 1 tablet by mouth daily.     clobetasol ointment (TEMOVATE) 0.05 % Apply topically as needed.     Cranberry 1000 MG CAPS Take 1 capsule by mouth daily.      diltiazem (CARDIZEM CD) 240 MG 24 hr capsule Take 240 mg by mouth at bedtime.  ELIQUIS 5 MG TABS tablet TAKE ONE TABLET BY MOUTH TWICE DAILY 180 tablet 3   ESTRACE VAGINAL 0.1 MG/GM vaginal cream Place 1 Applicatorful vaginally as needed.      flecainide (TAMBOCOR) 50 MG tablet Take 1 tablet (50 mg total) by mouth 2 (two) times daily. 180 tablet 2   folic acid (FOLVITE) 1 MG tablet Take 1 mg by mouth daily.     furosemide (LASIX) 20 MG tablet TAKE ONE TABLET EVERY DAY AS NEEDED 30 tablet 2   hydrALAZINE  (APRESOLINE) 25 MG tablet Take 25 mg by mouth 2 (two) times daily.     levothyroxine (SYNTHROID, LEVOTHROID) 100 MCG tablet Take 1 tablet (100 mcg) by mouth 6 days a week     methotrexate (RHEUMATREX) 2.5 MG tablet Take 12.5 mg by mouth once a week.     metoprolol succinate (TOPROL-XL) 25 MG 24 hr tablet TAKE ONE TABLET EVERY DAY 90 tablet 3   nitrofurantoin, macrocrystal-monohydrate, (MACROBID) 100 MG capsule Take 100 mg by mouth daily as needed.     olmesartan (BENICAR) 40 MG tablet Take 40 mg by mouth daily.     omeprazole (PRILOSEC) 40 MG capsule Take 40 mg by mouth daily as needed.     polyethylene glycol (MIRALAX / GLYCOLAX) 17 g packet Take 17 g by mouth daily.     sodium chloride 1 g tablet Take by mouth. Rarely taken per pt     traZODone (DESYREL) 50 MG tablet Take 50 mg by mouth at bedtime as needed.     No current facility-administered medications for this visit.    Allergies:   Prednisone   Social History:  The patient  reports that she has never smoked. She has never used smokeless tobacco. She reports that she does not drink alcohol and does not use drugs.   Family History:   family history includes Breast cancer in her maternal aunt; Heart attack in her father; Heart failure in her mother.    Review of Systems: Review of Systems  Constitutional: Negative.   HENT: Negative.    Respiratory: Negative.    Cardiovascular: Negative.   Gastrointestinal: Negative.   Musculoskeletal: Negative.   Neurological: Negative.   Psychiatric/Behavioral: Negative.    All other systems reviewed and are negative.   PHYSICAL EXAM: VS:  BP (!) 150/60 (BP Location: Left Arm, Patient Position: Sitting, Cuff Size: Normal)   Pulse 70   Ht 5\' 3"  (1.6 m)   Wt 173 lb 8 oz (78.7 kg)   SpO2 97%   BMI 30.73 kg/m  , BMI Body mass index is 30.73 kg/m. Constitutional:  oriented to person, place, and time. No distress.  HENT:  Head: Grossly normal Eyes:  no discharge. No scleral icterus.   Neck: No JVD, no carotid bruits  Cardiovascular: Regular rate and rhythm, no murmurs appreciated 1+ pitting edema left lower extremity, minimal on right Pulmonary/Chest: Clear to auscultation bilaterally, no wheezes or rails Abdominal: Soft.  no distension.  no tenderness.  Musculoskeletal: Normal range of motion Neurological:  normal muscle tone. Coordination normal. No atrophy Skin: Skin warm and dry Psychiatric: normal affect, pleasant   Recent Labs: No results found for requested labs within last 365 days.    Lipid Panel No results found for: "CHOL", "HDL", "LDLCALC", "TRIG"    Wt Readings from Last 3 Encounters:  11/05/23 173 lb 8 oz (78.7 kg)  09/25/23 170 lb 12.8 oz (77.5 kg)  09/11/22 162 lb (73.5 kg)  ASSESSMENT AND PLAN:  PAF (paroxysmal atrial fibrillation) (H[facility]) - On diltiazem, metoprolol, flecainide eliquis Maintain normal sinus rhythm, A-fib burden less than 5% Asymptomatic  Sinus node dysfunction ([facility]CC) pacemaker, followed by Dr. Graciela Husbands  Hyperlipidemia Cholesterol is at goal on the current lipid regimen. No changes to the medications were made.  Lower extremity edema Recommend leg elevation, compression hose particularly on left Lasix sparingly no more than 3 times a week  Essential hypertension Blood pressure elevated on today's visit, reports it is typically well-controlled at home.  Recommend she consider Lasix once or twice a week which may assist with blood pressure  Hyponatremia: Number stable, prior issues when taking Lasix daily    Orders Placed This Encounter  Procedures   EKG 12-Lead    Signed, Dossie Arbour, M.D., Ph.D. 11/05/2023  [facility] [place], [place] [phone]

## 2023-11-07 ENCOUNTER — Ambulatory Visit: Payer: PPO

## 2023-11-13 ENCOUNTER — Ambulatory Visit (INDEPENDENT_AMBULATORY_CARE_PROVIDER_SITE_OTHER): Payer: PPO

## 2023-11-13 ENCOUNTER — Telehealth: Payer: Self-pay

## 2023-11-13 ENCOUNTER — Ambulatory Visit
Admission: RE | Admit: 2023-11-13 | Discharge: 2023-11-13 | Disposition: A | Discharge status: Home / Self Care | Payer: PPO | Source: Ambulatory Visit | Attending: Internal Medicine | Admitting: Internal Medicine

## 2023-11-13 DIAGNOSIS — R921 Mammographic calcification found on diagnostic imaging of breast: Secondary | ICD-10-CM

## 2023-11-13 DIAGNOSIS — I495 Sick sinus syndrome: Secondary | ICD-10-CM | POA: Diagnosis not present

## 2023-11-13 DIAGNOSIS — R92323 Mammographic fibroglandular density, bilateral breasts: Secondary | ICD-10-CM | POA: Diagnosis not present

## 2023-11-13 NOTE — Telephone Encounter (Signed)
Outreach made to Pt regarding home monitoring schedule.  Explained that her appointment in November had been moved to December because the November remote was too soon.  Advised Pt she was probably  not aware of change.  Apologized for the inconvenience.  Gave all remote dates for 2025.  Pt thanked nurse for call back.

## 2023-11-14 LAB — CUP PACEART REMOTE DEVICE CHECK
Battery Remaining Longevity: 27 mo
Battery Voltage: 2.94 V
Brady Statistic AP VP Percent: 0.56 %
Brady Statistic AP VS Percent: 89.16 %
Brady Statistic AS VP Percent: 6.59 %
Brady Statistic AS VS Percent: 3.7 %
Brady Statistic RA Percent Paced: 88.63 %
Brady Statistic RV Percent Paced: 7.02 %
Date Time Interrogation Session: 20241211132737
Implantable Lead Connection Status: 753985
Implantable Lead Connection Status: 753985
Implantable Lead Implant Date: 20160720
Implantable Lead Implant Date: 20160720
Implantable Lead Location: 753859
Implantable Lead Location: 753860
Implantable Lead Model: 5076
Implantable Lead Model: 5076
Implantable Pulse Generator Implant Date: 20160720
Lead Channel Impedance Value: 342 Ohm
Lead Channel Impedance Value: 437 Ohm
Lead Channel Impedance Value: 532 Ohm
Lead Channel Impedance Value: 570 Ohm
Lead Channel Pacing Threshold Amplitude: 0.75 V
Lead Channel Pacing Threshold Amplitude: 0.875 V
Lead Channel Pacing Threshold Pulse Width: 0.4 ms
Lead Channel Pacing Threshold Pulse Width: 0.4 ms
Lead Channel Sensing Intrinsic Amplitude: 2.125 mV
Lead Channel Sensing Intrinsic Amplitude: 2.125 mV
Lead Channel Sensing Intrinsic Amplitude: 7.125 mV
Lead Channel Sensing Intrinsic Amplitude: 7.125 mV
Lead Channel Setting Pacing Amplitude: 1.5 V
Lead Channel Setting Pacing Amplitude: 2 V
Lead Channel Setting Pacing Pulse Width: 0.4 ms
Lead Channel Setting Sensing Sensitivity: 1.2 mV
Zone Setting Status: 755011
Zone Setting Status: 755011

## 2023-11-22 DIAGNOSIS — L940 Localized scleroderma [morphea]: Secondary | ICD-10-CM | POA: Diagnosis not present

## 2023-12-20 NOTE — Progress Notes (Signed)
Remote pacemaker transmission.   

## 2023-12-20 NOTE — Addendum Note (Signed)
Addended by: Elease Etienne A on: 12/20/2023 12:52 PM   Modules accepted: Orders

## 2023-12-27 ENCOUNTER — Ambulatory Visit: Payer: PPO | Admitting: Cardiovascular Disease

## 2024-01-08 ENCOUNTER — Other Ambulatory Visit: Payer: Self-pay | Admitting: Internal Medicine

## 2024-01-09 NOTE — Telephone Encounter (Signed)
 This is a Educational psychologist pt

## 2024-01-15 ENCOUNTER — Ambulatory Visit: Payer: PPO

## 2024-02-12 ENCOUNTER — Ambulatory Visit: Payer: PPO

## 2024-02-12 DIAGNOSIS — I495 Sick sinus syndrome: Secondary | ICD-10-CM | POA: Diagnosis not present

## 2024-02-13 LAB — CUP PACEART REMOTE DEVICE CHECK
Battery Remaining Longevity: 24 mo
Battery Voltage: 2.93 V
Brady Statistic AP VP Percent: 0.32 %
Brady Statistic AP VS Percent: 85.47 %
Brady Statistic AS VP Percent: 5.47 %
Brady Statistic AS VS Percent: 8.73 %
Brady Statistic RA Percent Paced: 84.56 %
Brady Statistic RV Percent Paced: 5.68 %
Date Time Interrogation Session: 20250312133058
Implantable Lead Connection Status: 753985
Implantable Lead Connection Status: 753985
Implantable Lead Implant Date: 20160720
Implantable Lead Implant Date: 20160720
Implantable Lead Location: 753859
Implantable Lead Location: 753860
Implantable Lead Model: 5076
Implantable Lead Model: 5076
Implantable Pulse Generator Implant Date: 20160720
Lead Channel Impedance Value: 323 Ohm
Lead Channel Impedance Value: 437 Ohm
Lead Channel Impedance Value: 551 Ohm
Lead Channel Impedance Value: 589 Ohm
Lead Channel Pacing Threshold Amplitude: 0.625 V
Lead Channel Pacing Threshold Amplitude: 0.875 V
Lead Channel Pacing Threshold Pulse Width: 0.4 ms
Lead Channel Pacing Threshold Pulse Width: 0.4 ms
Lead Channel Sensing Intrinsic Amplitude: 1.125 mV
Lead Channel Sensing Intrinsic Amplitude: 1.125 mV
Lead Channel Sensing Intrinsic Amplitude: 7.625 mV
Lead Channel Sensing Intrinsic Amplitude: 7.625 mV
Lead Channel Setting Pacing Amplitude: 1.5 V
Lead Channel Setting Pacing Amplitude: 2 V
Lead Channel Setting Pacing Pulse Width: 0.4 ms
Lead Channel Setting Sensing Sensitivity: 1.2 mV
Zone Setting Status: 755011
Zone Setting Status: 755011

## 2024-03-16 ENCOUNTER — Encounter: Payer: Self-pay | Admitting: Internal Medicine

## 2024-03-16 DIAGNOSIS — E782 Mixed hyperlipidemia: Secondary | ICD-10-CM | POA: Diagnosis not present

## 2024-03-16 DIAGNOSIS — R739 Hyperglycemia, unspecified: Secondary | ICD-10-CM | POA: Diagnosis not present

## 2024-03-23 DIAGNOSIS — R739 Hyperglycemia, unspecified: Secondary | ICD-10-CM | POA: Diagnosis not present

## 2024-03-23 DIAGNOSIS — I480 Paroxysmal atrial fibrillation: Secondary | ICD-10-CM | POA: Diagnosis not present

## 2024-03-23 DIAGNOSIS — E782 Mixed hyperlipidemia: Secondary | ICD-10-CM | POA: Diagnosis not present

## 2024-03-23 DIAGNOSIS — Z0000 Encounter for general adult medical examination without abnormal findings: Secondary | ICD-10-CM | POA: Diagnosis not present

## 2024-03-30 NOTE — Addendum Note (Signed)
 Addended by: Lott Rouleau A on: 03/30/2024 01:32 PM   Modules accepted: Orders

## 2024-03-30 NOTE — Progress Notes (Signed)
 Remote pacemaker transmission.

## 2024-05-01 DIAGNOSIS — D2261 Melanocytic nevi of right upper limb, including shoulder: Secondary | ICD-10-CM | POA: Diagnosis not present

## 2024-05-01 DIAGNOSIS — D2272 Melanocytic nevi of left lower limb, including hip: Secondary | ICD-10-CM | POA: Diagnosis not present

## 2024-05-01 DIAGNOSIS — D225 Melanocytic nevi of trunk: Secondary | ICD-10-CM | POA: Diagnosis not present

## 2024-05-01 DIAGNOSIS — D2262 Melanocytic nevi of left upper limb, including shoulder: Secondary | ICD-10-CM | POA: Diagnosis not present

## 2024-05-01 DIAGNOSIS — D2271 Melanocytic nevi of right lower limb, including hip: Secondary | ICD-10-CM | POA: Diagnosis not present

## 2024-05-01 DIAGNOSIS — L940 Localized scleroderma [morphea]: Secondary | ICD-10-CM | POA: Diagnosis not present

## 2024-05-14 ENCOUNTER — Telehealth: Payer: Self-pay | Admitting: Internal Medicine

## 2024-05-14 NOTE — Telephone Encounter (Signed)
 Patient's husband says he spoke with [employer] and transmissions weren't being received because the battery in her monitor was going dead. He says [employer] informed him they could send out a new battery for it, but it may take 2 weeks to arrive. However, he says the [employer] rep recommended getting an app on phone to avoid replacing battery. He requests a call back to further discuss.

## 2024-05-19 NOTE — Telephone Encounter (Signed)
 I spoke with the pt and [employer] sent them the monitor that needs an app.   I ordered the pt a new monitor. She should receive it in 7-10 business days.

## 2024-05-22 ENCOUNTER — Ambulatory Visit (INDEPENDENT_AMBULATORY_CARE_PROVIDER_SITE_OTHER)

## 2024-05-22 DIAGNOSIS — I495 Sick sinus syndrome: Secondary | ICD-10-CM

## 2024-05-25 LAB — CUP PACEART REMOTE DEVICE CHECK
Battery Remaining Longevity: 23 mo
Battery Voltage: 2.92 V
Brady Statistic AP VP Percent: 0.31 %
Brady Statistic AP VS Percent: 89.2 %
Brady Statistic AS VP Percent: 4.74 %
Brady Statistic AS VS Percent: 5.76 %
Brady Statistic RA Percent Paced: 88.48 %
Brady Statistic RV Percent Paced: 4.98 %
Date Time Interrogation Session: 20250620152614
Implantable Lead Connection Status: 753985
Implantable Lead Connection Status: 753985
Implantable Lead Implant Date: 20160720
Implantable Lead Implant Date: 20160720
Implantable Lead Location: 753859
Implantable Lead Location: 753860
Implantable Lead Model: 5076
Implantable Lead Model: 5076
Implantable Pulse Generator Implant Date: 20160720
Lead Channel Impedance Value: 342 Ohm
Lead Channel Impedance Value: 437 Ohm
Lead Channel Impedance Value: 532 Ohm
Lead Channel Impedance Value: 551 Ohm
Lead Channel Pacing Threshold Amplitude: 0.75 V
Lead Channel Pacing Threshold Amplitude: 0.75 V
Lead Channel Pacing Threshold Pulse Width: 0.4 ms
Lead Channel Pacing Threshold Pulse Width: 0.4 ms
Lead Channel Sensing Intrinsic Amplitude: 1.125 mV
Lead Channel Sensing Intrinsic Amplitude: 1.125 mV
Lead Channel Sensing Intrinsic Amplitude: 6.625 mV
Lead Channel Sensing Intrinsic Amplitude: 6.625 mV
Lead Channel Setting Pacing Amplitude: 1.5 V
Lead Channel Setting Pacing Amplitude: 2 V
Lead Channel Setting Pacing Pulse Width: 0.4 ms
Lead Channel Setting Sensing Sensitivity: 1.2 mV
Zone Setting Status: 755011
Zone Setting Status: 755011

## 2024-05-31 ENCOUNTER — Ambulatory Visit: Payer: Self-pay | Admitting: Cardiology

## 2024-07-02 DIAGNOSIS — H1131 Conjunctival hemorrhage, right eye: Secondary | ICD-10-CM | POA: Diagnosis not present

## 2024-07-13 DIAGNOSIS — Z961 Presence of intraocular lens: Secondary | ICD-10-CM | POA: Diagnosis not present

## 2024-07-20 NOTE — Progress Notes (Signed)
 Remote pacemaker transmission.

## 2024-07-20 NOTE — Addendum Note (Signed)
 Addended by: VICCI SELLER A on: 07/20/2024 11:21 AM   Modules accepted: Orders

## 2024-07-23 DIAGNOSIS — M7918 Myalgia, other site: Secondary | ICD-10-CM | POA: Diagnosis not present

## 2024-07-23 DIAGNOSIS — M5116 Intervertebral disc disorders with radiculopathy, lumbar region: Secondary | ICD-10-CM | POA: Diagnosis not present

## 2024-07-23 DIAGNOSIS — I480 Paroxysmal atrial fibrillation: Secondary | ICD-10-CM | POA: Diagnosis not present

## 2024-07-28 DIAGNOSIS — M1712 Unilateral primary osteoarthritis, left knee: Secondary | ICD-10-CM | POA: Diagnosis not present

## 2024-07-28 DIAGNOSIS — G8929 Other chronic pain: Secondary | ICD-10-CM | POA: Diagnosis not present

## 2024-07-28 DIAGNOSIS — M25562 Pain in left knee: Secondary | ICD-10-CM | POA: Diagnosis not present

## 2024-08-21 ENCOUNTER — Encounter

## 2024-08-27 ENCOUNTER — Ambulatory Visit (INDEPENDENT_AMBULATORY_CARE_PROVIDER_SITE_OTHER)

## 2024-08-27 DIAGNOSIS — I495 Sick sinus syndrome: Secondary | ICD-10-CM | POA: Diagnosis not present

## 2024-08-28 LAB — CUP PACEART REMOTE DEVICE CHECK
Battery Remaining Longevity: 21 mo
Battery Voltage: 2.91 V
Brady Statistic AP VP Percent: 0.39 %
Brady Statistic AP VS Percent: 90.99 %
Brady Statistic AS VP Percent: 5.38 %
Brady Statistic AS VS Percent: 3.24 %
Brady Statistic RA Percent Paced: 90.55 %
Brady Statistic RV Percent Paced: 5.68 %
Date Time Interrogation Session: 20250925153021
Implantable Lead Connection Status: 753985
Implantable Lead Connection Status: 753985
Implantable Lead Implant Date: 20160720
Implantable Lead Implant Date: 20160720
Implantable Lead Location: 753859
Implantable Lead Location: 753860
Implantable Lead Model: 5076
Implantable Lead Model: 5076
Implantable Pulse Generator Implant Date: 20160720
Lead Channel Impedance Value: 380 Ohm
Lead Channel Impedance Value: 475 Ohm
Lead Channel Impedance Value: 551 Ohm
Lead Channel Impedance Value: 570 Ohm
Lead Channel Pacing Threshold Amplitude: 0.625 V
Lead Channel Pacing Threshold Amplitude: 0.75 V
Lead Channel Pacing Threshold Pulse Width: 0.4 ms
Lead Channel Pacing Threshold Pulse Width: 0.4 ms
Lead Channel Sensing Intrinsic Amplitude: 1.5 mV
Lead Channel Sensing Intrinsic Amplitude: 1.5 mV
Lead Channel Sensing Intrinsic Amplitude: 8.125 mV
Lead Channel Sensing Intrinsic Amplitude: 8.125 mV
Lead Channel Setting Pacing Amplitude: 1.5 V
Lead Channel Setting Pacing Amplitude: 2 V
Lead Channel Setting Pacing Pulse Width: 0.4 ms
Lead Channel Setting Sensing Sensitivity: 1.2 mV
Zone Setting Status: 755011
Zone Setting Status: 755011

## 2024-08-29 ENCOUNTER — Ambulatory Visit: Payer: Self-pay | Admitting: Cardiology

## 2024-09-02 NOTE — Progress Notes (Signed)
 Remote PPM Transmission

## 2024-09-14 DIAGNOSIS — R739 Hyperglycemia, unspecified: Secondary | ICD-10-CM | POA: Diagnosis not present

## 2024-09-14 DIAGNOSIS — E782 Mixed hyperlipidemia: Secondary | ICD-10-CM | POA: Diagnosis not present

## 2024-09-21 DIAGNOSIS — I480 Paroxysmal atrial fibrillation: Secondary | ICD-10-CM | POA: Diagnosis not present

## 2024-09-21 DIAGNOSIS — E782 Mixed hyperlipidemia: Secondary | ICD-10-CM | POA: Diagnosis not present

## 2024-09-21 DIAGNOSIS — E1169 Type 2 diabetes mellitus with other specified complication: Secondary | ICD-10-CM | POA: Diagnosis not present

## 2024-09-21 DIAGNOSIS — Z79899 Other long term (current) drug therapy: Secondary | ICD-10-CM | POA: Diagnosis not present

## 2024-09-21 DIAGNOSIS — D500 Iron deficiency anemia secondary to blood loss (chronic): Secondary | ICD-10-CM | POA: Diagnosis not present

## 2024-10-22 DIAGNOSIS — D500 Iron deficiency anemia secondary to blood loss (chronic): Secondary | ICD-10-CM | POA: Diagnosis not present

## 2024-11-03 DIAGNOSIS — M79602 Pain in left arm: Secondary | ICD-10-CM | POA: Diagnosis not present

## 2024-11-03 DIAGNOSIS — M542 Cervicalgia: Secondary | ICD-10-CM | POA: Diagnosis not present

## 2024-11-20 ENCOUNTER — Encounter

## 2024-11-26 ENCOUNTER — Ambulatory Visit

## 2024-11-26 DIAGNOSIS — I495 Sick sinus syndrome: Secondary | ICD-10-CM | POA: Diagnosis not present

## 2024-11-30 LAB — CUP PACEART REMOTE DEVICE CHECK
Battery Remaining Longevity: 18 mo
Battery Voltage: 2.9 V
Brady Statistic AP VP Percent: 0.6 %
Brady Statistic AP VS Percent: 64.49 %
Brady Statistic AS VP Percent: 6.92 %
Brady Statistic AS VS Percent: 27.99 %
Brady Statistic RA Percent Paced: 63.25 %
Brady Statistic RV Percent Paced: 7.11 %
Date Time Interrogation Session: 20251226161455
Implantable Lead Connection Status: 753985
Implantable Lead Connection Status: 753985
Implantable Lead Implant Date: 20160720
Implantable Lead Implant Date: 20160720
Implantable Lead Location: 753859
Implantable Lead Location: 753860
Implantable Lead Model: 5076
Implantable Lead Model: 5076
Implantable Pulse Generator Implant Date: 20160720
Lead Channel Impedance Value: 323 Ohm
Lead Channel Impedance Value: 437 Ohm
Lead Channel Impedance Value: 532 Ohm
Lead Channel Impedance Value: 570 Ohm
Lead Channel Pacing Threshold Amplitude: 0.625 V
Lead Channel Pacing Threshold Amplitude: 0.875 V
Lead Channel Pacing Threshold Pulse Width: 0.4 ms
Lead Channel Pacing Threshold Pulse Width: 0.4 ms
Lead Channel Sensing Intrinsic Amplitude: 0.625 mV
Lead Channel Sensing Intrinsic Amplitude: 0.625 mV
Lead Channel Sensing Intrinsic Amplitude: 7.875 mV
Lead Channel Sensing Intrinsic Amplitude: 7.875 mV
Lead Channel Setting Pacing Amplitude: 1.5 V
Lead Channel Setting Pacing Amplitude: 2 V
Lead Channel Setting Pacing Pulse Width: 0.4 ms
Lead Channel Setting Sensing Sensitivity: 1.2 mV
Zone Setting Status: 755011
Zone Setting Status: 755011

## 2024-12-02 NOTE — Progress Notes (Signed)
 Remote PPM Transmission

## 2024-12-06 ENCOUNTER — Ambulatory Visit: Payer: Self-pay | Admitting: Cardiology

## 2024-12-25 ENCOUNTER — Other Ambulatory Visit: Payer: Self-pay | Admitting: Internal Medicine

## 2024-12-25 DIAGNOSIS — Z1231 Encounter for screening mammogram for malignant neoplasm of breast: Secondary | ICD-10-CM

## 2024-12-25 DIAGNOSIS — R921 Mammographic calcification found on diagnostic imaging of breast: Secondary | ICD-10-CM

## 2025-01-04 ENCOUNTER — Other Ambulatory Visit: Payer: Self-pay

## 2025-01-05 MED ORDER — METOPROLOL SUCCINATE ER 25 MG PO TB24: 25.0000 mg | ORAL_TABLET | Freq: Every day | ORAL | 3 refills | Status: AC

## 2025-01-22 ENCOUNTER — Encounter

## 2025-02-19 ENCOUNTER — Encounter

## 2025-02-25 ENCOUNTER — Encounter

## 2025-05-12 ENCOUNTER — Encounter

## 2025-05-21 ENCOUNTER — Encounter

## 2025-05-27 ENCOUNTER — Encounter

## 2025-08-11 ENCOUNTER — Encounter

## 2025-08-20 ENCOUNTER — Encounter

## 2025-08-26 ENCOUNTER — Encounter

## 2025-11-10 ENCOUNTER — Encounter

## 2025-11-19 ENCOUNTER — Encounter

## 2025-11-29 ENCOUNTER — Encounter

## 2026-02-09 ENCOUNTER — Encounter

## 2026-02-18 ENCOUNTER — Encounter

## 2026-05-11 ENCOUNTER — Encounter

## 2026-05-20 ENCOUNTER — Encounter

## 2026-08-10 ENCOUNTER — Encounter
